# Patient Record
Sex: Female | Born: 1945 | Race: Black or African American | Hispanic: No | Marital: Single | Smoking: Never smoker
Health system: Southern US, Community
[De-identification: ages and names within clinical notes are randomized; demographics above are authoritative.]

## PROBLEM LIST (undated history)

## (undated) ENCOUNTER — Emergency Department (HOSPITAL_COMMUNITY)

## (undated) DIAGNOSIS — I10 Essential (primary) hypertension: Secondary | ICD-10-CM

## (undated) DIAGNOSIS — J45909 Unspecified asthma, uncomplicated: Secondary | ICD-10-CM

## (undated) DIAGNOSIS — J40 Bronchitis, not specified as acute or chronic: Secondary | ICD-10-CM

## (undated) DIAGNOSIS — M109 Gout, unspecified: Secondary | ICD-10-CM

---

## 1998-11-12 ENCOUNTER — Emergency Department (HOSPITAL_COMMUNITY): Admission: EM | Admit: 1998-11-12 | Discharge: 1998-11-12 | Payer: Self-pay | Admitting: Emergency Medicine

## 1999-08-09 ENCOUNTER — Emergency Department (HOSPITAL_COMMUNITY): Admission: EM | Admit: 1999-08-09 | Discharge: 1999-08-09 | Payer: Self-pay | Admitting: Emergency Medicine

## 1999-10-28 ENCOUNTER — Emergency Department (HOSPITAL_COMMUNITY): Admission: EM | Admit: 1999-10-28 | Discharge: 1999-10-28 | Payer: Self-pay | Admitting: Emergency Medicine

## 1999-10-28 ENCOUNTER — Encounter: Payer: Self-pay | Admitting: Emergency Medicine

## 2004-07-27 ENCOUNTER — Emergency Department (HOSPITAL_COMMUNITY): Admission: EM | Admit: 2004-07-27 | Discharge: 2004-07-27 | Payer: Self-pay | Admitting: Emergency Medicine

## 2004-07-29 ENCOUNTER — Ambulatory Visit: Payer: Self-pay | Admitting: Nurse Practitioner

## 2004-07-30 ENCOUNTER — Ambulatory Visit: Payer: Self-pay | Admitting: Nurse Practitioner

## 2004-08-05 ENCOUNTER — Ambulatory Visit: Payer: Self-pay | Admitting: *Deleted

## 2004-08-05 ENCOUNTER — Ambulatory Visit: Payer: Self-pay | Admitting: Nurse Practitioner

## 2004-08-19 ENCOUNTER — Ambulatory Visit: Payer: Self-pay | Admitting: Nurse Practitioner

## 2005-05-09 ENCOUNTER — Ambulatory Visit: Payer: Self-pay | Admitting: Nurse Practitioner

## 2005-12-19 ENCOUNTER — Ambulatory Visit: Payer: Self-pay | Admitting: Nurse Practitioner

## 2006-03-22 ENCOUNTER — Ambulatory Visit: Payer: Self-pay | Admitting: Nurse Practitioner

## 2006-04-03 ENCOUNTER — Ambulatory Visit: Payer: Self-pay | Admitting: Nurse Practitioner

## 2007-03-15 ENCOUNTER — Ambulatory Visit: Payer: Self-pay | Admitting: Internal Medicine

## 2007-03-15 ENCOUNTER — Encounter (INDEPENDENT_AMBULATORY_CARE_PROVIDER_SITE_OTHER): Payer: Self-pay | Admitting: Nurse Practitioner

## 2007-03-15 LAB — CONVERTED CEMR LAB
ALT: 15 units/L (ref 0–35)
AST: 17 units/L (ref 0–37)
Albumin: 3.9 g/dL (ref 3.5–5.2)
Alkaline Phosphatase: 82 units/L (ref 39–117)
BUN: 8 mg/dL (ref 6–23)
Basophils Absolute: 0 10*3/uL (ref 0.0–0.1)
Basophils Relative: 0 % (ref 0–1)
CO2: 20 meq/L (ref 19–32)
Calcium: 9.7 mg/dL (ref 8.4–10.5)
Chloride: 111 meq/L (ref 96–112)
Cholesterol: 178 mg/dL (ref 0–200)
Creatinine, Ser: 0.86 mg/dL (ref 0.40–1.20)
Eosinophils Absolute: 0.3 10*3/uL (ref 0.0–0.7)
Eosinophils Relative: 4 % (ref 0–5)
Glucose, Bld: 119 mg/dL — ABNORMAL HIGH (ref 70–99)
HCT: 40 % (ref 36.0–46.0)
HDL: 42 mg/dL (ref >39)
Hemoglobin: 13 g/dL (ref 12.0–15.0)
LDL Cholesterol: 82 mg/dL (ref 0–99)
Lymphocytes Relative: 48 % — ABNORMAL HIGH (ref 12–46)
Lymphs Abs: 3.3 10*3/uL (ref 0.7–4.0)
MCHC: 32.5 g/dL (ref 30.0–36.0)
MCV: 92.6 fL (ref 78.0–100.0)
Microalb, Ur: 0.88 mg/dL (ref 0.00–1.89)
Monocytes Absolute: 0.6 10*3/uL (ref 0.1–1.0)
Monocytes Relative: 9 % (ref 3–12)
Neutro Abs: 2.7 10*3/uL (ref 1.7–7.7)
Neutrophils Relative %: 39 % — ABNORMAL LOW (ref 43–77)
Platelets: 312 10*3/uL (ref 150–400)
Potassium: 3.9 meq/L (ref 3.5–5.3)
RBC: 4.32 M/uL (ref 3.87–5.11)
RDW: 14.1 % (ref 11.5–15.5)
Sodium: 143 meq/L (ref 135–145)
TSH: 1.716 microintl units/mL (ref 0.350–5.50)
Total Bilirubin: 0.3 mg/dL (ref 0.3–1.2)
Total CHOL/HDL Ratio: 4.2
Total Protein: 7.4 g/dL (ref 6.0–8.3)
Triglycerides: 269 mg/dL — ABNORMAL HIGH (ref <150)
VLDL: 54 mg/dL — ABNORMAL HIGH (ref 0–40)
WBC: 6.8 10*3/uL (ref 4.0–10.5)

## 2007-04-17 ENCOUNTER — Ambulatory Visit: Payer: Self-pay | Admitting: Family Medicine

## 2008-07-03 ENCOUNTER — Encounter (INDEPENDENT_AMBULATORY_CARE_PROVIDER_SITE_OTHER): Payer: Self-pay | Admitting: Internal Medicine

## 2008-07-03 ENCOUNTER — Ambulatory Visit: Payer: Self-pay | Admitting: Internal Medicine

## 2008-07-03 LAB — CONVERTED CEMR LAB
ALT: 12 units/L (ref 0–35)
AST: 14 units/L (ref 0–37)
Albumin: 4 g/dL (ref 3.5–5.2)
Alkaline Phosphatase: 71 units/L (ref 39–117)
BUN: 14 mg/dL (ref 6–23)
CO2: 21 meq/L (ref 19–32)
Calcium: 10.5 mg/dL (ref 8.4–10.5)
Chloride: 110 meq/L (ref 96–112)
Cholesterol: 185 mg/dL (ref 0–200)
Creatinine, Ser: 0.94 mg/dL (ref 0.40–1.20)
Glucose, Bld: 108 mg/dL — ABNORMAL HIGH (ref 70–99)
HDL: 45 mg/dL (ref >39)
LDL Cholesterol: 114 mg/dL — ABNORMAL HIGH (ref 0–99)
Potassium: 4 meq/L (ref 3.5–5.3)
Sodium: 141 meq/L (ref 135–145)
TSH: 0.49 microintl units/mL (ref 0.350–4.500)
Total Bilirubin: 0.4 mg/dL (ref 0.3–1.2)
Total CHOL/HDL Ratio: 4.1
Total Protein: 7.4 g/dL (ref 6.0–8.3)
Triglycerides: 130 mg/dL (ref <150)
VLDL: 26 mg/dL (ref 0–40)

## 2008-07-25 ENCOUNTER — Ambulatory Visit (HOSPITAL_COMMUNITY): Admission: RE | Admit: 2008-07-25 | Discharge: 2008-07-25 | Payer: Self-pay | Admitting: Family Medicine

## 2008-07-31 ENCOUNTER — Ambulatory Visit: Payer: Self-pay | Admitting: Internal Medicine

## 2008-11-06 ENCOUNTER — Encounter (INDEPENDENT_AMBULATORY_CARE_PROVIDER_SITE_OTHER): Payer: Self-pay | Admitting: Internal Medicine

## 2008-11-06 ENCOUNTER — Ambulatory Visit: Payer: Self-pay | Admitting: Internal Medicine

## 2008-11-07 ENCOUNTER — Encounter (INDEPENDENT_AMBULATORY_CARE_PROVIDER_SITE_OTHER): Payer: Self-pay | Admitting: Internal Medicine

## 2008-11-25 ENCOUNTER — Ambulatory Visit (HOSPITAL_COMMUNITY): Admission: RE | Admit: 2008-11-25 | Discharge: 2008-11-25 | Payer: Self-pay | Admitting: Internal Medicine

## 2008-11-26 ENCOUNTER — Ambulatory Visit: Payer: Self-pay | Admitting: Internal Medicine

## 2008-12-30 ENCOUNTER — Ambulatory Visit: Payer: Self-pay | Admitting: Internal Medicine

## 2009-02-04 ENCOUNTER — Ambulatory Visit: Payer: Self-pay | Admitting: Internal Medicine

## 2009-04-07 ENCOUNTER — Ambulatory Visit: Payer: Self-pay | Admitting: Family Medicine

## 2013-03-02 ENCOUNTER — Emergency Department (HOSPITAL_COMMUNITY)
Admission: EM | Admit: 2013-03-02 | Discharge: 2013-03-02 | Disposition: A | Discharge status: Home / Self Care | Payer: Medicare Other | Attending: Emergency Medicine | Admitting: Emergency Medicine

## 2013-03-02 ENCOUNTER — Encounter (HOSPITAL_COMMUNITY): Payer: Self-pay | Admitting: Emergency Medicine

## 2013-03-02 DIAGNOSIS — Z8639 Personal history of other endocrine, nutritional and metabolic disease: Secondary | ICD-10-CM | POA: Diagnosis not present

## 2013-03-02 DIAGNOSIS — R21 Rash and other nonspecific skin eruption: Secondary | ICD-10-CM | POA: Insufficient documentation

## 2013-03-02 DIAGNOSIS — J45901 Unspecified asthma with (acute) exacerbation: Secondary | ICD-10-CM | POA: Insufficient documentation

## 2013-03-02 DIAGNOSIS — R51 Headache: Secondary | ICD-10-CM | POA: Insufficient documentation

## 2013-03-02 DIAGNOSIS — J45909 Unspecified asthma, uncomplicated: Secondary | ICD-10-CM

## 2013-03-02 DIAGNOSIS — Z79899 Other long term (current) drug therapy: Secondary | ICD-10-CM | POA: Insufficient documentation

## 2013-03-02 DIAGNOSIS — IMO0002 Reserved for concepts with insufficient information to code with codable children: Secondary | ICD-10-CM | POA: Insufficient documentation

## 2013-03-02 DIAGNOSIS — Z862 Personal history of diseases of the blood and blood-forming organs and certain disorders involving the immune mechanism: Secondary | ICD-10-CM | POA: Diagnosis not present

## 2013-03-02 DIAGNOSIS — J029 Acute pharyngitis, unspecified: Secondary | ICD-10-CM | POA: Diagnosis not present

## 2013-03-02 DIAGNOSIS — R519 Headache, unspecified: Secondary | ICD-10-CM

## 2013-03-02 HISTORY — DX: Unspecified asthma, uncomplicated: J45.909

## 2013-03-02 HISTORY — DX: Bronchitis, not specified as acute or chronic: J40

## 2013-03-02 HISTORY — DX: Gout, unspecified: M10.9

## 2013-03-02 MED ORDER — TRAMADOL HCL 50 MG PO TABS: 50.0000 mg | ORAL_TABLET | Freq: Four times a day (QID) | ORAL | Status: DC | PRN

## 2013-03-02 MED ORDER — PENICILLIN V POTASSIUM 500 MG PO TABS: 500.0000 mg | ORAL_TABLET | Freq: Four times a day (QID) | ORAL | Status: DC
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
## 2013-03-02 NOTE — ED Notes (Signed)
Pt a+ox4, presents with c/o R sided frontal HA x4 days, also c/o asthma exacerbation x4 days and a rash to R FA x4 days.  Skin PWD.  Speaking full/clear sentences.  MAEI.  Neuros grossly intact.
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
## 2013-06-27 ENCOUNTER — Encounter (HOSPITAL_COMMUNITY): Payer: Self-pay | Admitting: Emergency Medicine

## 2013-06-27 ENCOUNTER — Emergency Department (HOSPITAL_COMMUNITY)
Admission: EM | Admit: 2013-06-27 | Discharge: 2013-06-27 | Disposition: A | Discharge status: Home / Self Care | Payer: Medicare Other | Attending: Emergency Medicine | Admitting: Emergency Medicine

## 2013-06-27 DIAGNOSIS — Z792 Long term (current) use of antibiotics: Secondary | ICD-10-CM | POA: Insufficient documentation

## 2013-06-27 DIAGNOSIS — H1130 Conjunctival hemorrhage, unspecified eye: Secondary | ICD-10-CM

## 2013-06-27 DIAGNOSIS — Z8639 Personal history of other endocrine, nutritional and metabolic disease: Secondary | ICD-10-CM | POA: Insufficient documentation

## 2013-06-27 DIAGNOSIS — Z79899 Other long term (current) drug therapy: Secondary | ICD-10-CM | POA: Insufficient documentation

## 2013-06-27 DIAGNOSIS — Z862 Personal history of diseases of the blood and blood-forming organs and certain disorders involving the immune mechanism: Secondary | ICD-10-CM | POA: Insufficient documentation

## 2013-06-27 DIAGNOSIS — IMO0002 Reserved for concepts with insufficient information to code with codable children: Secondary | ICD-10-CM | POA: Insufficient documentation

## 2013-06-27 DIAGNOSIS — J45909 Unspecified asthma, uncomplicated: Secondary | ICD-10-CM | POA: Insufficient documentation

## 2013-06-27 MED ORDER — TETRACAINE HCL 0.5 % OP SOLN
2.0000 [drp] | Freq: Once | OPHTHALMIC | Status: AC
  Administered 2013-06-27: 2 [drp] via OPHTHALMIC
  Filled 2013-06-27: qty 2

## 2013-06-27 NOTE — ED Notes (Signed)
Pt states she woke up yesterday with redness in her right eye yesterday. Pt denies itching or pain.

## 2013-06-27 NOTE — Discharge Instructions (Signed)
Please read and follow all provided instructions.  Your diagnoses today include:  1. Subconjunctival hematoma     Tests performed today include:  Tonometry to check the pressure inside of your eye - was normal  Vital signs. See below for your results today.   Medications prescribed:   None  Take any prescribed medications only as directed.  Home care instructions:  Follow any educational materials contained in this packet.   Follow-up instructions: Please follow-up with your primary care doctor as needed.   If you do not have a primary care doctor -- see below for referral information.   Return instructions:   Please return to the Emergency Department if you experience worsening symptoms.   Please return immediately if you develop severe pain, pus drainage, new change in vision, or fever.  Please return if you have any other emergent concerns.  Additional Information:  Your vital signs today were: BP 143/77   Pulse 67   Temp(Src) 98.6 F (37 C) (Oral)   Resp 18   SpO2 98% If your blood pressure (BP) was elevated above 135/85 this visit, please have this repeated by your doctor within one month. ---------------

## 2013-06-27 NOTE — ED Provider Notes (Signed)
CSN: [id number]     Arrival date & time 06/27/13  1537 History  This chart was scribed for Alecia Lemming PA-C  working with [name], MD by Stacy Gardner, ED scribe. This patient was seen in room WTR6/WTR6 and the patient's care was started at 4:08 PM.  First MD Initiated Contact with Patient 06/27/13 1547     Chief Complaint  Patient presents with  . Eye Problem     (Consider location/radiation/quality/duration/timing/severity/associated sxs/prior Treatment) Patient is a 68 y.o. female presenting with eye problem. The history is provided by the patient and medical records. No language interpreter was used.  Eye Problem Associated symptoms: redness   Associated symptoms: no discharge, no itching, no photophobia and no vomiting    HPI Comments: Gabriella Butler is a 68 y.o. female who presents to the Emergency Department complaining of redness of her right eye, onset yesterday. Pt denies pain, drainage, vision changes, or itching. She did not try anything for the redness. Denies injury or trauma. Denies any prior eye conditions. She denies recent cough and vomiting. Pt does not wear contacts or eye glasses.    Past Medical History  Diagnosis Date  . Asthma   . Bronchitis   . Gout    History reviewed. No pertinent past surgical history. No family history on file. History  Substance Use Topics  . Smoking status: Never Smoker   . Smokeless tobacco: Never Used  . Alcohol Use: No   OB History   Grav Para Term Preterm Abortions TAB SAB Ect Mult Living                 Review of Systems  HENT: Negative for congestion.   Eyes: Positive for redness. Negative for photophobia, pain, discharge, itching and visual disturbance.  Respiratory: Negative for cough.   Gastrointestinal: Negative for vomiting.  Musculoskeletal: Negative for neck pain.    Allergies  Review of patient's allergies indicates no known allergies.  Home Medications   Prior to Admission medications   Medication  Sig Start Date End Date Taking? Authorizing Provider  beclomethasone (QVAR) 40 MCG/ACT inhaler Inhale 1 puff into the lungs 2 (two) times daily.    Historical Provider, MD  hydrochlorothiazide (MICROZIDE) 12.5 MG capsule Take 12.5 mg by mouth daily.    Historical Provider, MD  penicillin v potassium (VEETID) 500 MG tablet Take 1 tablet (500 mg total) by mouth 4 (four) times daily. 03/02/13   [name], MD  traMADol (ULTRAM) 50 MG tablet Take 1 tablet (50 mg total) by mouth every 6 (six) hours as needed. 03/02/13   [name], MD   BP 143/77  Pulse 67  Temp(Src) 98.6 F (37 C) (Oral)  Resp 18  SpO2 98%  Physical Exam  Nursing note and vitals reviewed. Constitutional: She appears well-developed and well-nourished.  HENT:  Head: Normocephalic and atraumatic.  Eyes: Pupils are equal, round, and reactive to light. Right eye exhibits no chemosis, no discharge and no exudate. Right conjunctiva is not injected. Right conjunctiva has a hemorrhage (laterally, superiorly). Left conjunctiva is not injected. Left conjunctiva has no hemorrhage.  Slit lamp exam:      The right eye shows no hyphema.  Intraocular pressure of 13 mmHg  Neck: Normal range of motion. Neck supple.  Cardiovascular: Normal rate.   Pulmonary/Chest: Effort normal. No respiratory distress.  Neurological: She is alert.  Skin: Skin is warm and dry. She is not diaphoretic.  Psychiatric: She has a normal mood and affect.  ED Course  Procedures (including critical care time) DIAGNOSTIC STUDIES: Oxygen Saturation is 98% on room air, normal by my interpretation.    COORDINATION OF CARE:  4:13 PM  Discussed course of care with pt which includes eye examination . Pt understands and agrees.   Labs Review Labs Reviewed - No data to display  Imaging Review No results found.   EKG Interpretation None      Vital signs reviewed and are as follows: Filed Vitals:   06/27/13 1549  BP: 143/77  Pulse: 67  Temp: 98.6 F (37  C)  Resp: 18     4:26 PM Two drops of tetracaine/proparacaine instilled into affected eye.   Tonometry performed. Right eye pressure: 13  Patient tolerated procedure well without immediate complication.   Patient counseled on typical progression of subconjunctival hemorrhage. Urged PCP followup only as needed. Patient is to return with worsening vision, eye pain, fever.   MDM   Final diagnoses:  Subconjunctival hematoma   Patient with exam consistent with subconjunctival hematoma. Patient has no other symptoms including change in her vision, eye pain, itching, discharge. Do not suspect conjunctivitis. No head injury. Unclear etiology. No recent forceful coughing or vomiting. No ophthalmologic emergency suspected.  I personally performed the services described in this documentation, which was scribed in my presence. The recorded information has been reviewed and is accurate.    Carlisle Cater, PA-C 06/27/13 [phone]

## 2013-07-01 NOTE — ED Provider Notes (Signed)
Medical screening examination/treatment/procedure(s) were performed by non-physician practitioner and as supervising physician I was immediately available for consultation/collaboration.   EKG Interpretation None        Derrich Gaby, MD 07/01/13 1624 

## 2014-08-02 ENCOUNTER — Encounter (HOSPITAL_COMMUNITY): Payer: Self-pay | Admitting: Nurse Practitioner

## 2014-08-02 ENCOUNTER — Emergency Department (HOSPITAL_COMMUNITY)
Admission: EM | Admit: 2014-08-02 | Discharge: 2014-08-02 | Disposition: A | Discharge status: Home / Self Care | Payer: Medicare Other | Attending: Emergency Medicine | Admitting: Emergency Medicine

## 2014-08-02 DIAGNOSIS — R0981 Nasal congestion: Secondary | ICD-10-CM

## 2014-08-02 DIAGNOSIS — Z7951 Long term (current) use of inhaled steroids: Secondary | ICD-10-CM | POA: Diagnosis not present

## 2014-08-02 DIAGNOSIS — H9203 Otalgia, bilateral: Secondary | ICD-10-CM | POA: Diagnosis present

## 2014-08-02 DIAGNOSIS — Z8739 Personal history of other diseases of the musculoskeletal system and connective tissue: Secondary | ICD-10-CM | POA: Diagnosis not present

## 2014-08-02 DIAGNOSIS — Z79899 Other long term (current) drug therapy: Secondary | ICD-10-CM | POA: Diagnosis not present

## 2014-08-02 DIAGNOSIS — J45909 Unspecified asthma, uncomplicated: Secondary | ICD-10-CM | POA: Insufficient documentation

## 2014-08-02 MED ORDER — PSEUDOEPHEDRINE HCL ER 120 MG PO TB12
120.0000 mg | ORAL_TABLET | Freq: Two times a day (BID) | ORAL | Status: DC
  Administered 2014-08-02: 120 mg via ORAL
  Filled 2014-08-02 (×3): qty 1

## 2014-08-02 MED ORDER — IBUPROFEN 200 MG PO TABS
600.0000 mg | ORAL_TABLET | Freq: Once | ORAL | Status: AC
  Administered 2014-08-02: 600 mg via ORAL
  Filled 2014-08-02: qty 3

## 2014-08-02 MED ORDER — PSEUDOEPHEDRINE HCL ER 120 MG PO TB12: 120.0000 mg | ORAL_TABLET | Freq: Two times a day (BID) | ORAL | Status: DC

## 2014-08-02 NOTE — ED Provider Notes (Signed)
CSN: [id number]     Arrival date & time 08/02/14  0111 History   First MD Initiated Contact with Patient 08/02/14 0127     Chief Complaint  Patient presents with  . Otalgia     (Consider location/radiation/quality/duration/timing/severity/associated sxs/prior Treatment) HPI Comments: Patient states she's had a stuffy nose and bilateral ear pain for 3 days.  She was to come yesterday and be seen, but she forgot and fell asleep, but tonight the pain worsened.  She has not taken anything for her symptoms.  Denies any fever or headache.  No chest pain or shortness of breath  Patient is a 69 y.o. female presenting with ear pain.  Otalgia Location:  Bilateral Behind ear:  No abnormality Quality:  Aching Severity:  Mild Onset quality:  Unable to specify Duration:  3 days Timing:  Constant Progression:  Unchanged Chronicity:  New Relieved by:  None tried Worsened by:  Nothing tried Ineffective treatments:  None tried Associated symptoms: congestion and rhinorrhea   Associated symptoms: no ear discharge, no fever, no headaches and no sore throat   Rhinorrhea:    Quality:  Clear   Timing:  Intermittent   Progression:  Unchanged   Past Medical History  Diagnosis Date  . Asthma   . Bronchitis   . Gout    History reviewed. No pertinent past surgical history. History reviewed. No pertinent family history. History  Substance Use Topics  . Smoking status: Never Smoker   . Smokeless tobacco: Never Used  . Alcohol Use: No   OB History    No data available     Review of Systems  Constitutional: Negative for fever.  HENT: Positive for congestion, ear pain and rhinorrhea. Negative for ear discharge and sore throat.   Neurological: Negative for headaches.  All other systems reviewed and are negative.     Allergies  Review of patient's allergies indicates no known allergies.  Home Medications   Prior to Admission medications   Medication Sig Start Date End Date Taking?  Authorizing Provider  beclomethasone (QVAR) 40 MCG/ACT inhaler Inhale 1 puff into the lungs 2 (two) times daily.   Yes Historical Provider, MD  QUEtiapine (SEROQUEL XR) 200 MG 24 hr tablet Take 200 mg by mouth once.   Yes Historical Provider, MD  hydrochlorothiazide (MICROZIDE) 12.5 MG capsule Take 12.5 mg by mouth daily.    Historical Provider, MD  penicillin v potassium (VEETID) 500 MG tablet Take 1 tablet (500 mg total) by mouth 4 (four) times daily. Patient not taking: Reported on 08/02/2014 03/02/13   [name], MD  pseudoephedrine (SUDAFED 12 HOUR) 120 MG 12 hr tablet Take 1 tablet (120 mg total) by mouth 2 (two) times daily. 08/02/14   [name], NP  traMADol (ULTRAM) 50 MG tablet Take 1 tablet (50 mg total) by mouth every 6 (six) hours as needed. Patient not taking: Reported on 08/02/2014 03/02/13   [name], MD   BP 182/71 mmHg  Pulse 81  Temp(Src) 98 F (36.7 C) (Oral)  Resp 14  SpO2 100% Physical Exam  Constitutional: She appears well-developed and well-nourished.  HENT:  Head: Normocephalic.  Right Ear: External ear normal.  Left Ear: External ear normal.  Mouth/Throat: Oropharynx is clear and moist.  Eyes: Pupils are equal, round, and reactive to light.  Neck: Normal range of motion.  Cardiovascular: Normal rate.   Pulmonary/Chest: Effort normal. No respiratory distress.  Musculoskeletal: Normal range of motion.  Neurological: She is alert.  Skin: Skin is warm.  Nursing note and vitals reviewed.   ED Course  Procedures (including critical care time) Labs Review Labs Reviewed - No data to display  Imaging Review No results found.   EKG Interpretation None      MDM   Final diagnoses:  Otalgia of both ears  Nasal congestion         [name], NP 08/02/14 0202  Julianne Rice, MD 08/02/14 [phone]

## 2014-08-02 NOTE — ED Notes (Signed)
Pt is c/o bilateral ear pain, onset 3 days ago, states worsened today rating pain 8/10.

## 2015-09-01 ENCOUNTER — Encounter (HOSPITAL_COMMUNITY): Payer: Self-pay | Admitting: Emergency Medicine

## 2015-09-01 ENCOUNTER — Emergency Department (HOSPITAL_COMMUNITY)
Admission: EM | Admit: 2015-09-01 | Discharge: 2015-09-02 | Disposition: A | Discharge status: Home / Self Care | Payer: Medicare Other | Attending: Emergency Medicine | Admitting: Emergency Medicine

## 2015-09-01 ENCOUNTER — Emergency Department (HOSPITAL_COMMUNITY): Payer: Medicare Other

## 2015-09-01 DIAGNOSIS — Z7951 Long term (current) use of inhaled steroids: Secondary | ICD-10-CM | POA: Insufficient documentation

## 2015-09-01 DIAGNOSIS — J40 Bronchitis, not specified as acute or chronic: Secondary | ICD-10-CM | POA: Insufficient documentation

## 2015-09-01 DIAGNOSIS — J441 Chronic obstructive pulmonary disease with (acute) exacerbation: Secondary | ICD-10-CM | POA: Diagnosis not present

## 2015-09-01 DIAGNOSIS — J449 Chronic obstructive pulmonary disease, unspecified: Secondary | ICD-10-CM | POA: Diagnosis not present

## 2015-09-01 DIAGNOSIS — R0602 Shortness of breath: Secondary | ICD-10-CM | POA: Diagnosis not present

## 2015-09-01 MED ORDER — ALBUTEROL SULFATE (2.5 MG/3ML) 0.083% IN NEBU
5.0000 mg | INHALATION_SOLUTION | Freq: Once | RESPIRATORY_TRACT | Status: AC
  Administered 2015-09-01: 5 mg via RESPIRATORY_TRACT
  Filled 2015-09-01: qty 6

## 2015-09-01 NOTE — ED Triage Notes (Signed)
Patient presents for SOB, HA and right ear pain starting PTA. Also reports productive cough with yellow sputum. Denies fever, N/V, weakness. A&O x4.

## 2015-09-02 ENCOUNTER — Encounter (HOSPITAL_COMMUNITY): Payer: Self-pay | Admitting: Emergency Medicine

## 2015-09-02 DIAGNOSIS — J441 Chronic obstructive pulmonary disease with (acute) exacerbation: Secondary | ICD-10-CM | POA: Diagnosis not present

## 2015-09-02 LAB — CBC WITH DIFFERENTIAL/PLATELET
BASOS ABS: 0 10*3/uL (ref 0.0–0.1)
BASOS PCT: 0 %
EOS ABS: 0.2 10*3/uL (ref 0.0–0.7)
EOS PCT: 2 %
HCT: 38 % (ref 36.0–46.0)
Hemoglobin: 12.3 g/dL (ref 12.0–15.0)
LYMPHS PCT: 45 %
Lymphs Abs: 3.7 10*3/uL (ref 0.7–4.0)
MCH: 29.4 pg (ref 26.0–34.0)
MCHC: 32.4 g/dL (ref 30.0–36.0)
MCV: 90.7 fL (ref 78.0–100.0)
Monocytes Absolute: 0.7 10*3/uL (ref 0.1–1.0)
Monocytes Relative: 9 %
Neutro Abs: 3.6 10*3/uL (ref 1.7–7.7)
Neutrophils Relative %: 44 %
PLATELETS: 270 10*3/uL (ref 150–400)
RBC: 4.19 MIL/uL (ref 3.87–5.11)
RDW: 14.4 % (ref 11.5–15.5)
WBC: 8.3 10*3/uL (ref 4.0–10.5)

## 2015-09-02 LAB — I-STAT TROPONIN, ED: TROPONIN I, POC: 0 ng/mL (ref 0.00–0.08)

## 2015-09-02 LAB — I-STAT CHEM 8, ED
BUN: 13 mg/dL (ref 6–20)
CHLORIDE: 107 mmol/L (ref 101–111)
Calcium, Ion: 1.28 mmol/L — ABNORMAL HIGH (ref 1.12–1.23)
Creatinine, Ser: 1.1 mg/dL — ABNORMAL HIGH (ref 0.44–1.00)
Glucose, Bld: 118 mg/dL — ABNORMAL HIGH (ref 65–99)
HEMATOCRIT: 39 % (ref 36.0–46.0)
Hemoglobin: 13.3 g/dL (ref 12.0–15.0)
POTASSIUM: 3.7 mmol/L (ref 3.5–5.1)
SODIUM: 145 mmol/L (ref 135–145)
TCO2: 25 mmol/L (ref 0–100)

## 2015-09-02 LAB — BRAIN NATRIURETIC PEPTIDE: B Natriuretic Peptide: 21.7 pg/mL (ref 0.0–100.0)

## 2015-09-02 MED ORDER — DOXYCYCLINE HYCLATE 100 MG PO CAPS: 100.0000 mg | ORAL_CAPSULE | Freq: Two times a day (BID) | ORAL | 0 refills | Status: DC

## 2015-09-02 MED ORDER — ALBUTEROL SULFATE HFA 108 (90 BASE) MCG/ACT IN AERS: 1.0000 | INHALATION_SPRAY | Freq: Four times a day (QID) | RESPIRATORY_TRACT | 0 refills | Status: DC | PRN

## 2015-09-02 MED ORDER — PREDNISONE 20 MG PO TABS
60.0000 mg | ORAL_TABLET | Freq: Once | ORAL | Status: AC
  Administered 2015-09-02: 60 mg via ORAL
  Filled 2015-09-02: qty 3

## 2015-09-02 MED ORDER — DM-GUAIFENESIN ER 30-600 MG PO TB12: 1.0000 | ORAL_TABLET | Freq: Two times a day (BID) | ORAL | 0 refills | Status: DC

## 2015-09-02 MED ORDER — DOXYCYCLINE HYCLATE 100 MG PO TABS
100.0000 mg | ORAL_TABLET | Freq: Once | ORAL | Status: AC
  Administered 2015-09-02: 100 mg via ORAL
  Filled 2015-09-02: qty 1

## 2015-09-02 MED ORDER — PREDNISONE 20 MG PO TABS: ORAL_TABLET | ORAL | 0 refills | Status: DC

## 2015-09-02 NOTE — ED Notes (Signed)
Food and drink given.

## 2015-09-02 NOTE — ED Provider Notes (Signed)
[facility] DEPT Provider Note   CSN: [id number] Arrival date & time: 09/01/15  2228  First Provider Contact:  None    By signing my name below, I, Gabriella Butler, attest that this documentation has been prepared under the direction and in the presence of Gabriella Fabiano, MD. Electronically Signed: Judithann Butler, ED Scribe. 09/02/15. 1:28 AM.   History   Chief Complaint Chief Complaint  Patient presents with  . Shortness of Breath  . Migraine    HPI Comments: Gabriella Butler is a 70 y.o. female who presents to the Emergency Department complaining of gradually worsening persistent moderate productive cough with yellow sputum onset yesterday. Pt reports associated shortness of breath and wheezing. No alleviating factors noted. Pt has not tried any medications PTA. She denies any fever, chills, acute leg swelling, n/v, or weakness.  No PND no orthopnea no pink or frothy sputum.  Ran out of her inhaler and that is what she thought brought this on  The history is provided by the patient. No language interpreter was used.  Cough   This is a new problem. The current episode started yesterday. The problem occurs constantly. The problem has been gradually worsening. Pain location: np pain. Pertinent negatives include no numbness. She has tried nothing for the symptoms. The treatment provided no relief.    Past Medical History:  Diagnosis Date  . Asthma   . Bronchitis   . Gout     There are no active problems to display for this patient.   History reviewed. No pertinent surgical history.  OB History    No data available       Home Medications    Prior to Admission medications   Medication Sig Start Date End Date Taking? Authorizing Provider  beclomethasone (QVAR) 40 MCG/ACT inhaler Inhale 1 puff into the lungs 2 (two) times daily.   Yes Historical Provider, MD  penicillin v potassium (VEETID) 500 MG tablet Take 1 tablet (500 mg total) by mouth 4 (four) times  daily. Patient not taking: Reported on 08/02/2014 03/02/13   [name], MD  pseudoephedrine (SUDAFED 12 HOUR) 120 MG 12 hr tablet Take 1 tablet (120 mg total) by mouth 2 (two) times daily. Patient not taking: Reported on 09/01/2015 08/02/14   [name], NP  traMADol (ULTRAM) 50 MG tablet Take 1 tablet (50 mg total) by mouth every 6 (six) hours as needed. Patient not taking: Reported on 08/02/2014 03/02/13   [name], MD    Family History No family history on file.  Social History Social History  Substance Use Topics  . Smoking status: Never Smoker  . Smokeless tobacco: Never Used  . Alcohol use No     Allergies   Review of patient's allergies indicates no known allergies.   Review of Systems Review of Systems  Constitutional: Negative for chills and fever.  Respiratory: Positive for cough, shortness of breath and wheezing. Negative for stridor.   Cardiovascular: Negative for chest pain, palpitations and leg swelling.  Gastrointestinal: Negative for nausea and vomiting.  Neurological: Negative for weakness and numbness.  All other systems reviewed and are negative.    Physical Exam Updated Vital Signs BP 155/87 (BP Location: Left Arm)   Pulse 78   Temp 98.4 F (36.9 C) (Oral)   Resp 20   SpO2 98%   Physical Exam  Constitutional: She is oriented to person, place, and time. She appears well-developed and well-nourished. No distress.  HENT:  Head: Normocephalic and atraumatic.  Mouth/Throat: Oropharynx is  clear and moist.  Eyes: Conjunctivae and EOM are normal. Pupils are equal, round, and reactive to light.  Neck: Neck supple. No JVD present. No tracheal deviation present.  No bruits  Cardiovascular: Normal rate, regular rhythm and normal heart sounds.   Pulmonary/Chest: Effort normal. No stridor. No respiratory distress. She has no wheezes. She has no rales. She exhibits no tenderness.  Lungs clear  Abdominal: Soft. There is no rebound and no guarding.  Hyperactive  bowel sounds  Musculoskeletal: Normal range of motion.  All compartments soft No pitting edema, no chords, no tenderness  Neurological: She is alert and oriented to person, place, and time. No cranial nerve deficit.  Skin: Skin is warm and dry. Capillary refill takes less than 2 seconds.  Psychiatric: She has a normal mood and affect. Her behavior is normal.  Nursing note and vitals reviewed.    ED Treatments / Results  DIAGNOSTIC STUDIES: Oxygen Saturation is 98% on RA, normal by my interpretation.    COORDINATION OF CARE: 1:25 AM- Pt advised of plan for treatment and pt agrees. Pt will receive lab work, chest x-ray, and EKG for further evaluation. She reports relief of her wheezing from the albuterol treatment received here in the ED today.    Labs (all labs ordered are listed, but only abnormal results are displayed) Labs Reviewed  CBC WITH DIFFERENTIAL/PLATELET  BRAIN NATRIURETIC PEPTIDE  I-STAT CHEM 8, ED  I-STAT TROPOININ, ED    EKG  EKG Interpretation  Date/Time:  Tuesday September 01 2015 22:41:46 EDT Ventricular Rate:  74 PR Interval:    QRS Duration: 83 QT Interval:  386 QTC Calculation: 429 R Axis:   31 Text Interpretation:  Sinus rhythm Low voltage, precordial leads Abnormal R-wave progression, early transition Baseline wander in lead(s) V2 No old tracing to compare Confirmed by BELFI  MD, MELANIE ([id number]) on 09/01/2015 11:44:22 PM       Radiology Dg Chest 2 View  Result Date: 09/01/2015 CLINICAL DATA:  70 year old female with shortness of breath EXAM: CHEST  2 VIEW COMPARISON:  Chest radiograph dated 07/27/2004 FINDINGS: Two views of the chest demonstrate mild central vascular prominence. Small focal hazy density in the left mid lung field may represent vascular crowding or less likely infiltrate. Clinical correlation is recommended. There is no pleural effusion, or pneumothorax. The cardiac silhouette is within normal limits. No acute osseous pathology. IMPRESSION:  Probable mild congestive changes. Vascular crowding versus less likely small focal infiltrate in the left mid lung field. Electronically Signed   By: [name] M.D.   On: 09/01/2015 22:58    Procedures Procedures (including critical care time)  Medications Ordered in ED Medications  albuterol (PROVENTIL) (2.5 MG/3ML) 0.083% nebulizer solution 5 mg (5 mg Nebulization Given 09/01/15 2244)     Initial Impression / Assessment and Plan / ED Course  Yovanny Coats, MD has reviewed the triage vital signs and the nursing notes.  Pertinent labs & imaging results that were available during my care of the patient were reviewed by me and considered in my medical decision making (see chart for details).  Clinical Course    Results for orders placed or performed during the hospital encounter of 09/01/15  CBC with Differential/Platelet  Result Value Ref Range   WBC 8.3 4.0 - 10.5 K/uL   RBC 4.19 3.87 - 5.11 MIL/uL   Hemoglobin 12.3 12.0 - 15.0 g/dL   HCT 38.0 36.0 - 46.0 %   MCV 90.7 78.0 - 100.0 fL   MCH  29.4 26.0 - 34.0 pg   MCHC 32.4 30.0 - 36.0 g/dL   RDW 14.4 11.5 - 15.5 %   Platelets 270 150 - 400 K/uL   Neutrophils Relative % 44 %   Neutro Abs 3.6 1.7 - 7.7 K/uL   Lymphocytes Relative 45 %   Lymphs Abs 3.7 0.7 - 4.0 K/uL   Monocytes Relative 9 %   Monocytes Absolute 0.7 0.1 - 1.0 K/uL   Eosinophils Relative 2 %   Eosinophils Absolute 0.2 0.0 - 0.7 K/uL   Basophils Relative 0 %   Basophils Absolute 0.0 0.0 - 0.1 K/uL  Brain natriuretic peptide  Result Value Ref Range   B Natriuretic Peptide 21.7 0.0 - 100.0 pg/mL  I-Stat Chem 8, ED  Result Value Ref Range   Sodium 145 135 - 145 mmol/L   Potassium 3.7 3.5 - 5.1 mmol/L   Chloride 107 101 - 111 mmol/L   BUN 13 6 - 20 mg/dL   Creatinine, Ser 1.10 (H) 0.44 - 1.00 mg/dL   Glucose, Bld 118 (H) 65 - 99 mg/dL   Calcium, Ion 1.28 (H) 1.12 - 1.23 mmol/L   TCO2 25 0 - 100 mmol/L   Hemoglobin 13.3 12.0 - 15.0 g/dL   HCT 39.0  36.0 - 46.0 %  I-stat troponin, ED  Result Value Ref Range   Troponin i, poc 0.00 0.00 - 0.08 ng/mL   Comment 3           Dg Chest 2 View  Result Date: 09/01/2015 CLINICAL DATA:  70 year old female with shortness of breath EXAM: CHEST  2 VIEW COMPARISON:  Chest radiograph dated 07/27/2004 FINDINGS: Two views of the chest demonstrate mild central vascular prominence. Small focal hazy density in the left mid lung field may represent vascular crowding or less likely infiltrate. Clinical correlation is recommended. There is no pleural effusion, or pneumothorax. The cardiac silhouette is within normal limits. No acute osseous pathology. IMPRESSION: Probable mild congestive changes. Vascular crowding versus less likely small focal infiltrate in the left mid lung field. Electronically Signed   By: [name] M.D.   On: 09/01/2015 22:58   Medications  albuterol (PROVENTIL) (2.5 MG/3ML) 0.083% nebulizer solution 5 mg (5 mg Nebulization Given 09/01/15 2244)  predniSONE (DELTASONE) tablet 60 mg (60 mg Oral Given 09/02/15 0145)  doxycycline (VIBRA-TABS) tablet 100 mg (100 mg Oral Given 09/02/15 0146)     Final Clinical Impressions(s) / ED Diagnoses   Final diagnoses:  None   I personally performed the services described in this documentation, which was scribed in my presence. The recorded information has been reviewed and is accurate.   The symptoms are consistent with an infection and bronchitis and not with CHF.  Lungs are clear will start steroids inhaler and antibiotics and have patient follow up with PMD for recheck in 2 days.  All questions answered to patient's satisfaction. Based on history and exam patient has been appropriately medically screened and emergency conditions excluded. Patient is stable for discharge at this time. Follow up with your PMDfor recheck in 2 daysand strict return precautions given.  New Prescriptions New Prescriptions   No medications on file     Najmo Pardue,  MD 09/02/15 [phone]

## 2015-09-02 NOTE — ED Notes (Signed)
No respiratory or acute distress noted alert and oriented x 3 call light in reach no reaction to medication noted able to speak in full sentences. 

## 2015-09-02 NOTE — ED Notes (Signed)
No respiratory or acute distress noted alert and oriented x 3 able to speak in full sentences states feels better now call light in reach.

## 2016-08-08 ENCOUNTER — Emergency Department (HOSPITAL_COMMUNITY): Payer: Medicare Other

## 2016-08-08 ENCOUNTER — Emergency Department (HOSPITAL_COMMUNITY)
Admission: EM | Admit: 2016-08-08 | Discharge: 2016-08-08 | Disposition: A | Discharge status: Home / Self Care | Payer: Medicare Other | Attending: Emergency Medicine | Admitting: Emergency Medicine

## 2016-08-08 ENCOUNTER — Encounter (HOSPITAL_COMMUNITY): Payer: Self-pay | Admitting: Emergency Medicine

## 2016-08-08 DIAGNOSIS — Z79899 Other long term (current) drug therapy: Secondary | ICD-10-CM | POA: Insufficient documentation

## 2016-08-08 DIAGNOSIS — M791 Myalgia: Secondary | ICD-10-CM | POA: Diagnosis not present

## 2016-08-08 DIAGNOSIS — J45909 Unspecified asthma, uncomplicated: Secondary | ICD-10-CM | POA: Insufficient documentation

## 2016-08-08 DIAGNOSIS — J069 Acute upper respiratory infection, unspecified: Secondary | ICD-10-CM | POA: Insufficient documentation

## 2016-08-08 DIAGNOSIS — R05 Cough: Secondary | ICD-10-CM | POA: Diagnosis present

## 2016-08-08 DIAGNOSIS — Z7951 Long term (current) use of inhaled steroids: Secondary | ICD-10-CM | POA: Diagnosis not present

## 2016-08-08 MED ORDER — AZITHROMYCIN 250 MG PO TABS: 250.0000 mg | ORAL_TABLET | Freq: Every day | ORAL | 0 refills | Status: DC

## 2016-08-08 NOTE — ED Provider Notes (Signed)
[facility] DEPT Provider Note   CSN: [id number] Arrival date & time: 08/08/16  0413     History   Chief Complaint Chief Complaint  Patient presents with  . Cough  . Generalized Body Aches  . Chills    HPI Gabriella Butler is a 71 y.o. female presented with 1 day history of stuffy nose, cough, body aches.  Patient states that around 10:30 last night, she started to feel poorly. This continued throughout the night, but states that she feels slightly better this morning. She's not tried anything for her symptoms including decongestants, Tylenol, or ibuprofen. She denies fever, chills, nausea, vomiting, abnormal bowel movements, or urinary symptoms. She states that she was recently exposed to several people with bronchitis. She reports no other medical problems, states she does not take medicines on a daily basis.  HPI  Past Medical History:  Diagnosis Date  . Asthma   . Bronchitis   . Gout     There are no active problems to display for this patient.   History reviewed. No pertinent surgical history.  OB History    No data available       Home Medications    Prior to Admission medications   Medication Sig Start Date End Date Taking? Authorizing Provider  albuterol (PROVENTIL HFA;VENTOLIN HFA) 108 (90 Base) MCG/ACT inhaler Inhale 1-2 puffs into the lungs every 6 (six) hours as needed for wheezing or shortness of breath. 09/02/15   Palumbo, April, MD  azithromycin (ZITHROMAX) 250 MG tablet Take 1 tablet (250 mg total) by mouth daily. Take first 2 tablets together, then 1 every day until finished. 08/08/16   Faustina Gebert, PA-C  beclomethasone (QVAR) 40 MCG/ACT inhaler Inhale 1 puff into the lungs 2 (two) times daily.    [provider]  dextromethorphan-guaiFENesin (MUCINEX DM) 30-600 MG 12hr tablet Take 1 tablet by mouth 2 (two) times daily. 09/02/15   Palumbo, April, MD  doxycycline (VIBRAMYCIN) 100 MG capsule Take 1 capsule (100 mg total) by mouth 2 (two) times  daily. One po bid x 7 days 09/02/15   Palumbo, April, MD  penicillin v potassium (VEETID) 500 MG tablet Take 1 tablet (500 mg total) by mouth 4 (four) times daily. Patient not taking: Reported on 08/02/2014 03/02/13   [name], MD  predniSONE (DELTASONE) 20 MG tablet 3 tabs po day one, then 2 po daily x 4 days 09/02/15   Randal Buba, April, MD  pseudoephedrine (SUDAFED 12 HOUR) 120 MG 12 hr tablet Take 1 tablet (120 mg total) by mouth 2 (two) times daily. Patient not taking: Reported on 09/01/2015 08/02/14   [name], NP  traMADol (ULTRAM) 50 MG tablet Take 1 tablet (50 mg total) by mouth every 6 (six) hours as needed. Patient not taking: Reported on 08/02/2014 03/02/13   [name], MD    Family History History reviewed. No pertinent family history.  Social History Social History  Substance Use Topics  . Smoking status: Never Smoker  . Smokeless tobacco: Never Used  . Alcohol use No     Allergies   Patient has no known allergies.   Review of Systems Review of Systems  Constitutional: Negative for appetite change, chills and fever.  HENT: Positive for congestion, rhinorrhea and sore throat. Negative for sinus pain and sinus pressure.   Eyes: Negative for pain, discharge and itching.  Respiratory: Positive for cough. Negative for chest tightness and shortness of breath.   Cardiovascular: Negative for chest pain.  Gastrointestinal: Negative for abdominal distention,  abdominal pain, constipation, diarrhea, nausea and vomiting.  Genitourinary: Negative for dysuria, frequency and hematuria.  Musculoskeletal: Positive for myalgias. Negative for arthralgias and back pain.  Skin: Negative for rash.  Neurological: Negative for dizziness and headaches.  Psychiatric/Behavioral: Negative for confusion.     Physical Exam Updated Vital Signs BP (!) 148/75 (BP Location: Left Arm)   Pulse (!) 56   Temp 98 F (36.7 C) (Oral)   Resp 16   Ht 5\' 7"  (1.702 m)   Wt 111.1 kg (245 lb)   SpO2 100%    BMI 38.37 kg/m   Physical Exam  Constitutional: She is oriented to person, place, and time. She appears well-developed and well-nourished.  HENT:  Head: Normocephalic and atraumatic.  Right Ear: Tympanic membrane, external ear and ear canal normal.  Left Ear: Tympanic membrane, external ear and ear canal normal.  Nose: Mucosal edema and rhinorrhea present. Right sinus exhibits no maxillary sinus tenderness and no frontal sinus tenderness. Left sinus exhibits no maxillary sinus tenderness and no frontal sinus tenderness.  Mouth/Throat: Uvula is midline, oropharynx is clear and moist and mucous membranes are normal.  Eyes: Conjunctivae and EOM are normal. Pupils are equal, round, and reactive to light.  Neck: Normal range of motion. Neck supple.  Cardiovascular: Normal rate, regular rhythm, normal heart sounds and intact distal pulses.   Pulmonary/Chest: Effort normal and breath sounds normal. No respiratory distress. She has no wheezes.  Abdominal: Soft. Bowel sounds are normal. She exhibits no distension. There is no tenderness.  Musculoskeletal: Normal range of motion.  Lymphadenopathy:    She has no cervical adenopathy.  Neurological: She is alert and oriented to person, place, and time.  Skin: Skin is warm and dry. No rash noted.  Psychiatric: She has a normal mood and affect.  Nursing note and vitals reviewed.    ED Treatments / Results  Labs (all labs ordered are listed, but only abnormal results are displayed) Labs Reviewed - No data to display  EKG  EKG Interpretation None       Radiology Dg Chest 2 View  Result Date: 08/08/2016 CLINICAL DATA:  Body aches EXAM: CHEST  2 VIEW COMPARISON:  09/01/2015 FINDINGS: Ill-defined opacities project lateral to the left heart border. Right lung is clear. No pneumothorax or pleural effusion. Normal heart size. IMPRESSION: Left basilar atelectasis versus airspace disease. Followup PA and lateral chest X-ray is recommended in 3-4 weeks  following trial of antibiotic therapy to ensure resolution and exclude underlying malignancy. Electronically Signed   By: Marybelle Killings M.D.   On: 08/08/2016 07:38    Procedures Procedures (including critical care time)  Medications Ordered in ED Medications - No data to display   Initial Impression / Assessment and Plan / ED Course  I have reviewed the triage vital signs and the nursing notes.  Pertinent labs & imaging results that were available during my care of the patient were reviewed by me and considered in my medical decision making (see chart for details).     Patient presenting with cold-like symptoms beginning last night. Will order chest x-ray to rule out infection.  Chest x-ray showed left basilar atelectasis versus airspace disease. Recommended follow-up x-ray in 3-4 weeks as well as antibiotic therapy. Discussed with attending, and Dr. Zenia Resides evaluated the patient. Will prescribe Z-Pak to patient. She is to continue symptomatic treatment, including Tylenol or ibuprofen as needed for fevers or body aches. Discussed importance of follow-up in 3-4 weeks for repeat chest x-ray. Patient does  not have primary care provider, will refer to [place] and wellness. Patient appears safe for discharge. Return precautions given. Patient states she understands and agrees to plan.  Final Clinical Impressions(s) / ED Diagnoses   Final diagnoses:  Upper respiratory tract infection, unspecified type    New Prescriptions Discharge Medication List as of 08/08/2016  8:27 AM    START taking these medications   Details  azithromycin (ZITHROMAX) 250 MG tablet Take 1 tablet (250 mg total) by mouth daily. Take first 2 tablets together, then 1 every day until finished., Starting Mon 08/08/2016, Print         [place], [place], PA-C 08/08/16 1550    Lacretia Leigh, MD 08/12/16 [phone]

## 2016-08-08 NOTE — ED Notes (Signed)
Patient is alert and oriented x3.  She was given DC instructions and follow up visit instructions.  Patient gave verbal understanding. She was DC ambulatory under her own power to home.  V/S stable.  He was not showing any signs of distress on DC 

## 2016-08-08 NOTE — Discharge Instructions (Signed)
Take all the antibiotics as prescribed. You may use Tylenol or ibuprofen as needed for fever or body aches. It is very important that you follow-up in 3-4 weeks for repeat chest x-ray. Return to the emergency department if you have worsening fever or chills despite antibiotics, or any new or worsening symptoms.

## 2016-08-08 NOTE — ED Triage Notes (Signed)
Patient states around 22:30 she started feeling real cold, stuffy nose, cough, and body aches. Patient has no n/v/d. Patient states she has no other symptoms.

## 2016-08-08 NOTE — ED Notes (Signed)
Pt c/o cough, runny nose, chills onset 22:30.

## 2016-08-08 NOTE — ED Provider Notes (Signed)
Medical screening examination/treatment/procedure(s) were conducted as a shared visit with non-physician practitioner(s) and myself.  I personally evaluated the patient during the encounter.   EKG Interpretation None     71 year old lady presents with URI symptoms 1 day. X-ray with possible pneumonia. Will treat with antibiotics and follow-up instructions given   Lacretia Leigh, MD 08/08/16 [phone]

## 2016-09-14 ENCOUNTER — Emergency Department (HOSPITAL_COMMUNITY): Payer: Medicare Other

## 2016-09-14 ENCOUNTER — Encounter (HOSPITAL_COMMUNITY): Payer: Self-pay | Admitting: Emergency Medicine

## 2016-09-14 ENCOUNTER — Emergency Department (HOSPITAL_COMMUNITY)
Admission: EM | Admit: 2016-09-14 | Discharge: 2016-09-14 | Disposition: A | Discharge status: Home / Self Care | Payer: Medicare Other | Attending: Emergency Medicine | Admitting: Emergency Medicine

## 2016-09-14 DIAGNOSIS — R918 Other nonspecific abnormal finding of lung field: Secondary | ICD-10-CM | POA: Diagnosis not present

## 2016-09-14 DIAGNOSIS — Z0001 Encounter for general adult medical examination with abnormal findings: Secondary | ICD-10-CM | POA: Diagnosis not present

## 2016-09-14 DIAGNOSIS — Z79899 Other long term (current) drug therapy: Secondary | ICD-10-CM | POA: Insufficient documentation

## 2016-09-14 DIAGNOSIS — Z8709 Personal history of other diseases of the respiratory system: Secondary | ICD-10-CM | POA: Diagnosis not present

## 2016-09-14 DIAGNOSIS — J45909 Unspecified asthma, uncomplicated: Secondary | ICD-10-CM | POA: Diagnosis not present

## 2016-09-14 DIAGNOSIS — Z9289 Personal history of other medical treatment: Secondary | ICD-10-CM | POA: Diagnosis not present

## 2016-09-14 DIAGNOSIS — Z0000 Encounter for general adult medical examination without abnormal findings: Secondary | ICD-10-CM | POA: Insufficient documentation

## 2016-09-14 NOTE — ED Provider Notes (Signed)
[facility] DEPT Provider Note   CSN: [id number] Arrival date & time: 09/14/16  1450     History   Chief Complaint No chief complaint on file.   HPI Gabriella Butler is a 71 y.o. female with history of asthma, gout, and HTN who presents today for repeat chest x-ray. She was seen and evaluated 08/08/2016 for cough, congestion, and body aches and was found to have. She was treated with azithromycin and per radiology recommendation she is back for repeat chest x-ray. She denies shortness of breath, chest pains, fevers, chills, congestion, or any other medical complaints. She was also encouraged to make an appointment at [facility] and wellness because she does not have a primary care physician. She has not made an appointment at this time. She has been out of her blood pressure medications for 6 months but denies any associated symptoms  The history is provided by the patient.    Past Medical History:  Diagnosis Date  . Asthma   . Bronchitis   . Gout     There are no active problems to display for this patient.   History reviewed. No pertinent surgical history.  OB History    No data available       Home Medications    Prior to Admission medications   Medication Sig Start Date End Date Taking? Authorizing Provider  beclomethasone (QVAR) 40 MCG/ACT inhaler Inhale 1 puff into the lungs 2 (two) times daily.   Yes [provider]  albuterol (PROVENTIL HFA;VENTOLIN HFA) 108 (90 Base) MCG/ACT inhaler Inhale 1-2 puffs into the lungs every 6 (six) hours as needed for wheezing or shortness of breath. Patient not taking: Reported on 09/14/2016 09/02/15   Palumbo, April, MD  azithromycin (ZITHROMAX) 250 MG tablet Take 1 tablet (250 mg total) by mouth daily. Take first 2 tablets together, then 1 every day until finished. Patient not taking: Reported on 09/14/2016 08/08/16   Caccavale, Sophia, PA-C  dextromethorphan-guaiFENesin (MUCINEX DM) 30-600 MG 12hr tablet Take 1 tablet by  mouth 2 (two) times daily. Patient not taking: Reported on 09/14/2016 09/02/15   Randal Buba, April, MD  doxycycline (VIBRAMYCIN) 100 MG capsule Take 1 capsule (100 mg total) by mouth 2 (two) times daily. One po bid x 7 days Patient not taking: Reported on 09/14/2016 09/02/15   Palumbo, April, MD  penicillin v potassium (VEETID) 500 MG tablet Take 1 tablet (500 mg total) by mouth 4 (four) times daily. Patient not taking: Reported on 08/02/2014 03/02/13   [name], MD  predniSONE (DELTASONE) 20 MG tablet 3 tabs po day one, then 2 po daily x 4 days Patient not taking: Reported on 09/14/2016 09/02/15   Randal Buba, April, MD  pseudoephedrine (SUDAFED 12 HOUR) 120 MG 12 hr tablet Take 1 tablet (120 mg total) by mouth 2 (two) times daily. Patient not taking: Reported on 09/01/2015 08/02/14   [name], NP  traMADol (ULTRAM) 50 MG tablet Take 1 tablet (50 mg total) by mouth every 6 (six) hours as needed. Patient not taking: Reported on 08/02/2014 03/02/13   [name], MD    Family History History reviewed. No pertinent family history.  Social History Social History  Substance Use Topics  . Smoking status: Never Smoker  . Smokeless tobacco: Never Used  . Alcohol use No     Allergies   Patient has no known allergies.   Review of Systems Review of Systems  Constitutional: Negative for chills and fever.  HENT: Negative for congestion.   Respiratory:  Negative for cough, chest tightness and shortness of breath.   Cardiovascular: Negative for chest pain.  Gastrointestinal: Negative for abdominal pain, nausea and vomiting.  Neurological: Negative for headaches.  All other systems reviewed and are negative.    Physical Exam Updated Vital Signs BP (!) 143/67   Pulse 67   Temp 98.3 F (36.8 C) (Oral)   Resp 16   SpO2 94%   Physical Exam  Constitutional: She appears well-developed and well-nourished. No distress.  HENT:  Head: Normocephalic and atraumatic.  Eyes: Conjunctivae are normal. Right eye  exhibits no discharge. Left eye exhibits no discharge.  Neck: No JVD present. No tracheal deviation present.  Cardiovascular: Normal rate, regular rhythm, normal heart sounds and intact distal pulses.   Pulmonary/Chest: Effort normal and breath sounds normal. No respiratory distress. She has no wheezes. She has no rales. She exhibits no tenderness.  Abdominal: Soft. Bowel sounds are normal. She exhibits no distension. There is no tenderness.  Musculoskeletal: She exhibits no edema.  Neurological: She is alert.  Skin: Skin is warm and dry. No erythema.  Psychiatric: She has a normal mood and affect. Her behavior is normal.  Nursing note and vitals reviewed.    ED Treatments / Results  Labs (all labs ordered are listed, but only abnormal results are displayed) Labs Reviewed - No data to display  EKG  EKG Interpretation None       Radiology Dg Chest 2 View  Result Date: 09/14/2016 CLINICAL DATA:  Follow-up previous abnormal chest x-ray on August 08, 2016. No current complaints. The patient has completed antibiotic therapy. History of asthma -bronchitis. Nonsmoker. EXAM: CHEST  2 VIEW COMPARISON:  PA and lateral chest x-ray of August 08, 2016. FINDINGS: The lungs are adequately inflated. There is subtle density above the cardiac apex which is stable as far back as 2006. There has been interval clearing of the retrocardiac region on the left. There is no residual infiltrate in the inferior aspect of the right upper lobe. There is no pleural effusion. The heart and pulmonary vascularity are normal. The mediastinum is normal in width. The trachea is midline. The bony thorax exhibits no acute abnormality. IMPRESSION: Interval clearing of infiltrates at the right lung base and inferior aspect of the left upper lobe. Chronic stable density above the cardiac apex on the left. Electronically Signed   By: David  Martinique M.D.   On: 09/14/2016 17:08    Procedures Procedures (including critical care  time)  Medications Ordered in ED Medications - No data to display   Initial Impression / Assessment and Plan / ED Course  I have reviewed the triage vital signs and the nursing notes.  Pertinent labs & imaging results that were available during my care of the patient were reviewed by me and considered in my medical decision making (see chart for details).     Patient with history of abnormal chest x-ray who presents for repeat imaging. No medical complaints at this time and vital signs are stable. Repeat chest x-ray shows clearing of infiltrates at the right lung base and inferior aspect of the left upper lobe as well as a stable density above the cardiac apex on the left which has been stable as far back as 2006. Examination is unremarkable. Emphasized the importance of follow-up with primary care physician for workup of her high blood pressure and asthma. She is not having any symptoms at this time. Discussed indications for return to the ED. Pt verbalized understanding of and agreement  with plan and is safe for discharge home at this time. Patient seen and a value by Dr. Ellender Hose who agrees with plan and assessment.  Final Clinical Impressions(s) / ED Diagnoses   Final diagnoses:  History of chest x-ray    New Prescriptions New Prescriptions   No medications on file     Debroah Baller 09/14/16 1929    Duffy Bruce, MD 09/15/16 1110

## 2016-09-14 NOTE — Discharge Instructions (Signed)
Your repeat chest x-ray looks better than a few weeks ago. Follow-up with [facility] and wellness as soon as possible for reevaluation of your chronic problems like high blood pressure and asthma. Return to the ED if any concerning signs or symptoms develop.

## 2016-09-14 NOTE — ED Triage Notes (Signed)
Pt seen in ED on July 9th, had abnormal x-ray, was told to return for repeat chest x-ray after a few weeks of antibiotics. Asymptomatic currently.

## 2017-02-03 ENCOUNTER — Encounter (HOSPITAL_COMMUNITY): Payer: Self-pay | Admitting: Emergency Medicine

## 2017-02-03 DIAGNOSIS — M79604 Pain in right leg: Secondary | ICD-10-CM | POA: Insufficient documentation

## 2017-02-03 DIAGNOSIS — S8991XA Unspecified injury of right lower leg, initial encounter: Secondary | ICD-10-CM | POA: Diagnosis not present

## 2017-02-03 DIAGNOSIS — Z5321 Procedure and treatment not carried out due to patient leaving prior to being seen by health care provider: Secondary | ICD-10-CM | POA: Insufficient documentation

## 2017-02-03 DIAGNOSIS — Y939 Activity, unspecified: Secondary | ICD-10-CM | POA: Diagnosis not present

## 2017-02-03 DIAGNOSIS — M25561 Pain in right knee: Secondary | ICD-10-CM | POA: Diagnosis not present

## 2017-02-03 DIAGNOSIS — M1711 Unilateral primary osteoarthritis, right knee: Secondary | ICD-10-CM | POA: Diagnosis not present

## 2017-02-03 DIAGNOSIS — W010XXA Fall on same level from slipping, tripping and stumbling without subsequent striking against object, initial encounter: Secondary | ICD-10-CM | POA: Diagnosis not present

## 2017-02-03 DIAGNOSIS — M25461 Effusion, right knee: Secondary | ICD-10-CM | POA: Diagnosis not present

## 2017-02-03 DIAGNOSIS — R0981 Nasal congestion: Secondary | ICD-10-CM | POA: Insufficient documentation

## 2017-02-03 DIAGNOSIS — S99911A Unspecified injury of right ankle, initial encounter: Secondary | ICD-10-CM | POA: Diagnosis present

## 2017-02-03 DIAGNOSIS — Z760 Encounter for issue of repeat prescription: Secondary | ICD-10-CM | POA: Diagnosis not present

## 2017-02-03 DIAGNOSIS — S93401A Sprain of unspecified ligament of right ankle, initial encounter: Secondary | ICD-10-CM | POA: Diagnosis not present

## 2017-02-03 DIAGNOSIS — J45909 Unspecified asthma, uncomplicated: Secondary | ICD-10-CM | POA: Diagnosis not present

## 2017-02-03 DIAGNOSIS — M25571 Pain in right ankle and joints of right foot: Secondary | ICD-10-CM | POA: Diagnosis not present

## 2017-02-03 DIAGNOSIS — Y929 Unspecified place or not applicable: Secondary | ICD-10-CM | POA: Diagnosis not present

## 2017-02-03 DIAGNOSIS — Y998 Other external cause status: Secondary | ICD-10-CM | POA: Diagnosis not present

## 2017-02-03 NOTE — ED Triage Notes (Signed)
Patient here from home with complaints of right leg pain after fall today. Ambulatory.  Also states that she needs a new prescription for her inhaler.

## 2017-02-04 ENCOUNTER — Emergency Department (HOSPITAL_COMMUNITY): Payer: Medicare Other

## 2017-02-04 ENCOUNTER — Emergency Department (HOSPITAL_COMMUNITY)
Admission: EM | Admit: 2017-02-04 | Discharge: 2017-02-04 | Disposition: A | Discharge status: Home / Self Care | Payer: Medicare Other | Attending: Emergency Medicine | Admitting: Emergency Medicine

## 2017-02-04 ENCOUNTER — Emergency Department (HOSPITAL_COMMUNITY)
Admission: EM | Admit: 2017-02-04 | Discharge: 2017-02-04 | Disposition: A | Discharge status: Home / Self Care | Payer: Medicare Other | Source: Home / Self Care

## 2017-02-04 ENCOUNTER — Encounter (HOSPITAL_COMMUNITY): Payer: Self-pay | Admitting: Emergency Medicine

## 2017-02-04 DIAGNOSIS — S8991XA Unspecified injury of right lower leg, initial encounter: Secondary | ICD-10-CM | POA: Diagnosis not present

## 2017-02-04 DIAGNOSIS — S93401A Sprain of unspecified ligament of right ankle, initial encounter: Secondary | ICD-10-CM | POA: Insufficient documentation

## 2017-02-04 DIAGNOSIS — M1711 Unilateral primary osteoarthritis, right knee: Secondary | ICD-10-CM | POA: Diagnosis not present

## 2017-02-04 DIAGNOSIS — M25461 Effusion, right knee: Secondary | ICD-10-CM

## 2017-02-04 DIAGNOSIS — M25561 Pain in right knee: Secondary | ICD-10-CM | POA: Diagnosis not present

## 2017-02-04 DIAGNOSIS — J45909 Unspecified asthma, uncomplicated: Secondary | ICD-10-CM | POA: Insufficient documentation

## 2017-02-04 DIAGNOSIS — Y998 Other external cause status: Secondary | ICD-10-CM | POA: Insufficient documentation

## 2017-02-04 DIAGNOSIS — Y939 Activity, unspecified: Secondary | ICD-10-CM | POA: Insufficient documentation

## 2017-02-04 DIAGNOSIS — M25571 Pain in right ankle and joints of right foot: Secondary | ICD-10-CM | POA: Diagnosis not present

## 2017-02-04 DIAGNOSIS — W010XXA Fall on same level from slipping, tripping and stumbling without subsequent striking against object, initial encounter: Secondary | ICD-10-CM | POA: Insufficient documentation

## 2017-02-04 DIAGNOSIS — Y929 Unspecified place or not applicable: Secondary | ICD-10-CM | POA: Insufficient documentation

## 2017-02-04 DIAGNOSIS — Z760 Encounter for issue of repeat prescription: Secondary | ICD-10-CM | POA: Insufficient documentation

## 2017-02-04 MED ORDER — BECLOMETHASONE DIPROPIONATE 40 MCG/ACT IN AERS: 1.0000 | INHALATION_SPRAY | Freq: Two times a day (BID) | RESPIRATORY_TRACT | 0 refills | Status: AC

## 2017-02-04 MED ORDER — ALBUTEROL SULFATE HFA 108 (90 BASE) MCG/ACT IN AERS: 1.0000 | INHALATION_SPRAY | Freq: Four times a day (QID) | RESPIRATORY_TRACT | 0 refills | Status: DC | PRN

## 2017-02-04 MED ORDER — TRAMADOL HCL 50 MG PO TABS
50.0000 mg | ORAL_TABLET | Freq: Once | ORAL | Status: AC
  Administered 2017-02-04: 50 mg via ORAL
  Filled 2017-02-04: qty 1

## 2017-02-04 MED ORDER — TRAMADOL HCL 50 MG PO TABS: 50.0000 mg | ORAL_TABLET | Freq: Four times a day (QID) | ORAL | 0 refills | Status: DC | PRN

## 2017-02-04 NOTE — Discharge Instructions (Signed)
Take the medications as needed for pain.  He can also use ice to help with the swelling.  Follow-up with an orthopedic doctor for further evaluation

## 2017-02-04 NOTE — ED Notes (Signed)
No answer when called to reassess vitals x1 

## 2017-02-04 NOTE — ED Triage Notes (Signed)
Patient adds that her inhaler ran out at new year's and needs refill.

## 2017-02-04 NOTE — ED Notes (Signed)
Pt ambulated with crutches without assistance.

## 2017-02-04 NOTE — ED Notes (Signed)
Per registration patient left AMA

## 2017-02-04 NOTE — ED Triage Notes (Signed)
Patient reports that she was broughtin last night around 9pm and has been in lobby since but no one has called her name. Reviewed notes from night staff with patient in her chart that when she was called couple times there wasn't answer as to why her name was taken out.  Patient c/o of right leg pain with right knee swelling after fall last night.

## 2017-02-04 NOTE — ED Notes (Signed)
Pt unable to sign due to E-Signature not working.

## 2017-02-04 NOTE — ED Provider Notes (Signed)
[facility] DEPT Provider Note   CSN: [id number] Arrival date & time: 02/04/17  0830     History   Chief Complaint Chief Complaint  Patient presents with  . Fall  . Knee Pain  . Leg Pain  . Foot Pain  . Medication Refill    HPI Gabriella Butler is a 72 y.o. female.  HPI Patient presents to the emergency room for evaluation of a knee injury.  Patient slipped and fell injuring her right knee yesterday.  Since then she is noticed swelling in her right knee.  She is having pain from her knee down to her ankle.  She denies any fevers or chills.  No numbness or weakness.  Patient also states she ran out of her inhaler and would like a refill.  She denies any breathing difficulties. Past Medical History:  Diagnosis Date  . Asthma   . Bronchitis   . Gout     There are no active problems to display for this patient.   History reviewed. No pertinent surgical history.  OB History    No data available       Home Medications    Prior to Admission medications   Medication Sig Start Date End Date Taking? Authorizing Provider  albuterol (PROVENTIL HFA;VENTOLIN HFA) 108 (90 Base) MCG/ACT inhaler Inhale 1-2 puffs into the lungs every 6 (six) hours as needed for wheezing or shortness of breath. 02/04/17   Dorie Rank, MD  azithromycin (ZITHROMAX) 250 MG tablet Take 1 tablet (250 mg total) by mouth daily. Take first 2 tablets together, then 1 every day until finished. Patient not taking: Reported on 09/14/2016 08/08/16   Caccavale, Sophia, PA-C  beclomethasone (QVAR) 40 MCG/ACT inhaler Inhale 1 puff into the lungs 2 (two) times daily. 02/04/17   Dorie Rank, MD  dextromethorphan-guaiFENesin [facility] DM) 30-600 MG 12hr tablet Take 1 tablet by mouth 2 (two) times daily. Patient not taking: Reported on 09/14/2016 09/02/15   Randal Buba, April, MD  doxycycline (VIBRAMYCIN) 100 MG capsule Take 1 capsule (100 mg total) by mouth 2 (two) times daily. One po bid x 7 days Patient  not taking: Reported on 09/14/2016 09/02/15   Palumbo, April, MD  penicillin v potassium (VEETID) 500 MG tablet Take 1 tablet (500 mg total) by mouth 4 (four) times daily. Patient not taking: Reported on 08/02/2014 03/02/13   [name], MD  predniSONE (DELTASONE) 20 MG tablet 3 tabs po day one, then 2 po daily x 4 days Patient not taking: Reported on 09/14/2016 09/02/15   Randal Buba, April, MD  pseudoephedrine (SUDAFED 12 HOUR) 120 MG 12 hr tablet Take 1 tablet (120 mg total) by mouth 2 (two) times daily. Patient not taking: Reported on 09/01/2015 08/02/14   [name], NP  traMADol (ULTRAM) 50 MG tablet Take 1 tablet (50 mg total) by mouth every 6 (six) hours as needed. 02/04/17   Dorie Rank, MD    Family History No family history on file.  Social History Social History   Tobacco Use  . Smoking status: Never Smoker  . Smokeless tobacco: Never Used  Substance Use Topics  . Alcohol use: No  . Drug use: No     Allergies   Patient has no known allergies.   Review of Systems Review of Systems  All other systems reviewed and are negative.    Physical Exam Updated Vital Signs BP (!) 142/67 (BP Location: Left Arm)   Pulse 80   Temp 98.5 F (36.9 C) (Oral)  Resp 20   SpO2 100%   Physical Exam  Constitutional: She appears well-developed and well-nourished. No distress.  HENT:  Head: Normocephalic and atraumatic.  Right Ear: External ear normal.  Left Ear: External ear normal.  Eyes: Conjunctivae are normal. Right eye exhibits no discharge. Left eye exhibits no discharge. No scleral icterus.  Neck: Neck supple. No tracheal deviation present.  Cardiovascular: Normal rate and regular rhythm.  Pulmonary/Chest: Effort normal and breath sounds normal. No respiratory distress. She has no wheezes.  Abdominal: She exhibits no distension.  Musculoskeletal: She exhibits tenderness. She exhibits no edema.       Right knee: She exhibits decreased range of motion, swelling and effusion. She  exhibits no deformity, no laceration and no erythema. Tenderness found.       Right ankle: She exhibits normal range of motion and no swelling. Tenderness. Lateral malleolus and medial malleolus tenderness found.  Neurological: She is alert. Cranial nerve deficit: no gross deficits.  Skin: Skin is warm and dry. No rash noted.  Psychiatric: She has a normal mood and affect.  Nursing note and vitals reviewed.    ED Treatments / Results  Labs (all labs ordered are listed, but only abnormal results are displayed) Labs Reviewed - No data to display   Radiology Dg Ankle Complete Right  Result Date: 02/04/2017 CLINICAL DATA:  Acute right ankle pain following fall today. Initial encounter. EXAM: RIGHT ANKLE - COMPLETE 3+ VIEW COMPARISON:  None. FINDINGS: There is no evidence of acute fracture, subluxation or dislocation. Soft tissue swelling is present. A calcaneal spur is present. No suspicious focal bony lesions are identified. IMPRESSION: Soft tissue swelling without acute bony abnormality. Electronically Signed   By: Margarette Canada M.D.   On: 02/04/2017 10:51   Dg Knee Complete 4 Views Right  Result Date: 02/04/2017 CLINICAL DATA:  Patient with knee pain status post fall. EXAM: RIGHT KNEE - COMPLETE 4+ VIEW COMPARISON:  None. FINDINGS: Tricompartmental degenerative changes. Nonstandard positioning limits evaluation. No definite evidence for acute displaced fracture. No joint effusion. IMPRESSION: Nonstandard positioning limits evaluation. No evidence for displaced fracture. Tricompartmental degenerative changes. Electronically Signed   By: Lovey Newcomer M.D.   On: 02/04/2017 09:20    Procedures Procedures (including critical care time)  Medications Ordered in ED Medications  traMADol (ULTRAM) tablet 50 mg (50 mg Oral Given 02/04/17 1102)     Initial Impression / Assessment and Plan / ED Course  I have reviewed the triage vital signs and the nursing notes.  Pertinent labs & imaging results  that were available during my care of the patient were reviewed by me and considered in my medical decision making (see chart for details).   No fx on xray.  No signs to suggest infection.  Doubt gout flare.  Suspect sprain, strain injury associated with OA and knee effusion.  Dc home with pain meds.  Follow up with ortho.  Cruches and knee sleeve as needed for support  Final Clinical Impressions(s) / ED Diagnoses   Final diagnoses:  Osteoarthritis of right knee, unspecified osteoarthritis type  Effusion of right knee  Sprain of right ankle, unspecified ligament, initial encounter    ED Discharge Orders        Ordered    traMADol (ULTRAM) 50 MG tablet  Every 6 hours PRN     02/04/17 1104    albuterol (PROVENTIL HFA;VENTOLIN HFA) 108 (90 Base) MCG/ACT inhaler  Every 6 hours PRN     02/04/17 1104  beclomethasone (QVAR) 40 MCG/ACT inhaler  2 times daily     02/04/17 1104       Dorie Rank, MD 02/04/17 1108

## 2017-07-12 ENCOUNTER — Encounter (HOSPITAL_COMMUNITY): Payer: Self-pay | Admitting: Emergency Medicine

## 2017-07-12 ENCOUNTER — Ambulatory Visit (HOSPITAL_COMMUNITY)
Admission: EM | Admit: 2017-07-12 | Discharge: 2017-07-12 | Disposition: A | Discharge status: Home / Self Care | Payer: Medicare Other | Attending: Family Medicine | Admitting: Family Medicine

## 2017-07-12 ENCOUNTER — Other Ambulatory Visit: Payer: Self-pay

## 2017-07-12 DIAGNOSIS — J019 Acute sinusitis, unspecified: Secondary | ICD-10-CM

## 2017-07-12 MED ORDER — CETIRIZINE HCL 10 MG PO CAPS: 10.0000 mg | ORAL_CAPSULE | Freq: Every day | ORAL | 0 refills | Status: DC

## 2017-07-12 MED ORDER — ACETAMINOPHEN 325 MG PO TABS
650.0000 mg | ORAL_TABLET | Freq: Once | ORAL | Status: AC
  Administered 2017-07-12: 650 mg via ORAL

## 2017-07-12 MED ORDER — PREDNISONE 50 MG PO TABS: 50.0000 mg | ORAL_TABLET | Freq: Every day | ORAL | 0 refills | Status: AC

## 2017-07-12 MED ORDER — AMOXICILLIN-POT CLAVULANATE 875-125 MG PO TABS: 1.0000 | ORAL_TABLET | Freq: Two times a day (BID) | ORAL | 0 refills | Status: AC

## 2017-07-12 MED ORDER — ACETAMINOPHEN 325 MG PO TABS
ORAL_TABLET | ORAL | Status: AC
  Filled 2017-07-12: qty 2

## 2017-07-12 NOTE — ED Provider Notes (Signed)
[facility]    CSN: [id number] Arrival date & time: 07/12/17  1643     History   Chief Complaint Chief Complaint  Patient presents with  . URI    HPI Gabriella Butler is a 72 y.o. female history of asthma, gout,Patient is presenting with URI symptoms- congestion, cough, bilateral ear pain.  Patient also noting significant head pressure patient's main complaints are persistence of symptoms. Symptoms have been going on for 1 month. Patient has tried Sudafed, with minimal relief.  Endorses low-grade fever, denies nausea, vomiting, diarrhea. Denies shortness of breath and chest pain.    HPI  Past Medical History:  Diagnosis Date  . Asthma   . Bronchitis   . Gout     There are no active problems to display for this patient.   History reviewed. No pertinent surgical history.  OB History   None      Home Medications    Prior to Admission medications   Medication Sig Start Date End Date Taking? Authorizing Provider  beclomethasone (QVAR) 40 MCG/ACT inhaler Inhale 1 puff into the lungs 2 (two) times daily. 02/04/17  Yes Dorie Rank, MD  traMADol (ULTRAM) 50 MG tablet Take 1 tablet (50 mg total) by mouth every 6 (six) hours as needed. 02/04/17  Yes Dorie Rank, MD  albuterol (PROVENTIL HFA;VENTOLIN HFA) 108 (90 Base) MCG/ACT inhaler Inhale 1-2 puffs into the lungs every 6 (six) hours as needed for wheezing or shortness of breath. 02/04/17   Dorie Rank, MD  amoxicillin-clavulanate (AUGMENTIN) 875-125 MG tablet Take 1 tablet by mouth every 12 (twelve) hours for 10 days. 07/12/17 07/22/17  Laurieanne Galloway C, PA-C  Cetirizine HCl 10 MG CAPS Take 1 capsule (10 mg total) by mouth daily for 10 days. 07/12/17 07/22/17  Alira Fretwell C, PA-C  predniSONE (DELTASONE) 50 MG tablet Take 1 tablet (50 mg total) by mouth daily for 5 days. 07/12/17 07/17/17  Nahuel Wilbert, Elesa Hacker, PA-C    Family History History reviewed. No pertinent family history.  Social History Social History   Tobacco Use   . Smoking status: Never Smoker  . Smokeless tobacco: Never Used  Substance Use Topics  . Alcohol use: No  . Drug use: No     Allergies   Patient has no known allergies.   Review of Systems Review of Systems  Constitutional: Positive for fatigue and fever. Negative for activity change, appetite change and chills.  HENT: Positive for congestion, ear pain, rhinorrhea and sinus pressure. Negative for sore throat and trouble swallowing.   Respiratory: Positive for cough. Negative for chest tightness and shortness of breath.   Cardiovascular: Negative for chest pain.  Gastrointestinal: Negative for abdominal pain, nausea and vomiting.  Musculoskeletal: Negative for myalgias.  Skin: Negative for rash.  Neurological: Negative for dizziness, light-headedness and headaches.     Physical Exam Triage Vital Signs ED Triage Vitals  Enc Vitals Group     BP 07/12/17 1709 121/73     Pulse Rate 07/12/17 1709 97     Resp --      Temp 07/12/17 1709 (!) 100.5 F (38.1 C)     Temp Source 07/12/17 1709 Oral     SpO2 07/12/17 1709 98 %     Weight --      Height --      Head Circumference --      Peak Flow --      Pain Score 07/12/17 1708 7     Pain Loc --  Pain Edu? --      Excl. in [place]? --    No data found.  Updated Vital Signs BP 121/73 (BP Location: Left Arm)   Pulse 97   Temp (!) 100.5 F (38.1 C) (Oral)   SpO2 98%   Visual Acuity Right Eye Distance:   Left Eye Distance:   Bilateral Distance:    Right Eye Near:   Left Eye Near:    Bilateral Near:     Physical Exam  Constitutional: She is oriented to person, place, and time. She appears well-developed and well-nourished. No distress.  HENT:  Head: Normocephalic and atraumatic.  Bilateral TMs blocked by cerumen, cerumen partially removed bilaterally.  Oral mucosa pink and moist, no tonsillar enlargement or exudate. Posterior pharynx patent and nonerythematous, no uvula deviation or swelling. Normal phonation.    Eyes: Conjunctivae are normal.  Neck: Neck supple.  Cardiovascular: Normal rate and regular rhythm.  No murmur heard. Pulmonary/Chest: Effort normal and breath sounds normal. No respiratory distress.  Breathing comfortably at rest, CTABL, no wheezing, rales or other adventitious sounds auscultated  Abdominal: Soft. There is no tenderness.  Musculoskeletal: She exhibits no edema.  Neurological: She is alert and oriented to person, place, and time.  Skin: Skin is warm and dry.  Psychiatric: She has a normal mood and affect.  Nursing note and vitals reviewed.    UC Treatments / Results  Labs (all labs ordered are listed, but only abnormal results are displayed) Labs Reviewed - No data to display  EKG None  Radiology No results found.  Procedures Procedures (including critical care time)  Medications Ordered in UC Medications  acetaminophen (TYLENOL) tablet 650 mg (650 mg Oral Given 07/12/17 1714)    Initial Impression / Assessment and Plan / UC Course  I have reviewed the triage vital signs and the nursing notes.  Pertinent labs & imaging results that were available during my care of the patient were reviewed by me and considered in my medical decision making (see chart for details).     Patient with 1 month of URI symptoms, low-grade fever in clinic today.  We will go ahead and treat for bacterial sinusitis with Augmentin, prednisone and Zyrtec.  Bilateral TMs not visualized, given treating with Augmentin to cover for possible otitis media.  Will have patient return if pain not resolving with treatment. Advised to use Debrox to break up further cerumen impaction.Discussed strict return precautions. Patient verbalized understanding and is agreeable with plan.  Final Clinical Impressions(s) / UC Diagnoses   Final diagnoses:  Acute sinusitis with symptoms > 10 days     Discharge Instructions     Please begin Augmentin twice daily for the next 10 days  Prednisone  daily for 5 days to help with sinus pressure and inflammation  Alternate Tylenol and ibuprofen every 4 hours for control of fever and pain  Please begin Zyrtec daily for congestion  Please use Debrox eardrops that you may get over-the-counter to help break up some of the wax in your ears; please return if your pain not improving with treatment   ED Prescriptions    Medication Sig Dispense Auth. Provider   amoxicillin-clavulanate (AUGMENTIN) 875-125 MG tablet Take 1 tablet by mouth every 12 (twelve) hours for 10 days. 20 tablet Kamylah Manzo C, PA-C   predniSONE (DELTASONE) 50 MG tablet Take 1 tablet (50 mg total) by mouth daily for 5 days. 5 tablet Tenaya Hilyer C, PA-C   Cetirizine HCl 10 MG CAPS Take 1  capsule (10 mg total) by mouth daily for 10 days. 10 capsule Jeno Calleros C, PA-C     Controlled Substance Prescriptions [place] Controlled Substance Registry consulted? Not Applicable   Janith Lima, [place] 07/12/17 1839

## 2017-07-12 NOTE — ED Triage Notes (Signed)
Pt reports nasal drainage, PND, and bilateral ear pain x1 month along with some head pressure.

## 2017-07-12 NOTE — Discharge Instructions (Signed)
Please begin Augmentin twice daily for the next 10 days  Prednisone daily for 5 days to help with sinus pressure and inflammation  Alternate Tylenol and ibuprofen every 4 hours for control of fever and pain  Please begin Zyrtec daily for congestion  Please use Debrox eardrops that you may get over-the-counter to help break up some of the wax in your ears; please return if your pain not improving with treatment

## 2017-12-06 ENCOUNTER — Emergency Department (HOSPITAL_COMMUNITY): Payer: Medicare Other

## 2017-12-06 ENCOUNTER — Inpatient Hospital Stay (HOSPITAL_COMMUNITY)
Admission: EM | Admit: 2017-12-06 | Discharge: 2017-12-07 | DRG: 439 | Disposition: A | Discharge status: Home / Self Care | Payer: Medicare Other | Attending: Internal Medicine | Admitting: Internal Medicine

## 2017-12-06 ENCOUNTER — Encounter (HOSPITAL_COMMUNITY): Payer: Self-pay | Admitting: *Deleted

## 2017-12-06 ENCOUNTER — Other Ambulatory Visit: Payer: Self-pay

## 2017-12-06 ENCOUNTER — Inpatient Hospital Stay (HOSPITAL_COMMUNITY): Payer: Medicare Other

## 2017-12-06 DIAGNOSIS — R945 Abnormal results of liver function studies: Secondary | ICD-10-CM | POA: Diagnosis not present

## 2017-12-06 DIAGNOSIS — R739 Hyperglycemia, unspecified: Secondary | ICD-10-CM | POA: Diagnosis not present

## 2017-12-06 DIAGNOSIS — R7303 Prediabetes: Secondary | ICD-10-CM | POA: Diagnosis present

## 2017-12-06 DIAGNOSIS — E876 Hypokalemia: Secondary | ICD-10-CM | POA: Diagnosis present

## 2017-12-06 DIAGNOSIS — J45909 Unspecified asthma, uncomplicated: Secondary | ICD-10-CM | POA: Diagnosis present

## 2017-12-06 DIAGNOSIS — K8020 Calculus of gallbladder without cholecystitis without obstruction: Secondary | ICD-10-CM | POA: Diagnosis not present

## 2017-12-06 DIAGNOSIS — Z6832 Body mass index (BMI) 32.0-32.9, adult: Secondary | ICD-10-CM | POA: Diagnosis not present

## 2017-12-06 DIAGNOSIS — R933 Abnormal findings on diagnostic imaging of other parts of digestive tract: Secondary | ICD-10-CM | POA: Diagnosis not present

## 2017-12-06 DIAGNOSIS — K810 Acute cholecystitis: Secondary | ICD-10-CM | POA: Diagnosis present

## 2017-12-06 DIAGNOSIS — K8050 Calculus of bile duct without cholangitis or cholecystitis without obstruction: Secondary | ICD-10-CM | POA: Diagnosis not present

## 2017-12-06 DIAGNOSIS — R748 Abnormal levels of other serum enzymes: Secondary | ICD-10-CM

## 2017-12-06 DIAGNOSIS — Z8249 Family history of ischemic heart disease and other diseases of the circulatory system: Secondary | ICD-10-CM

## 2017-12-06 DIAGNOSIS — R935 Abnormal findings on diagnostic imaging of other abdominal regions, including retroperitoneum: Secondary | ICD-10-CM | POA: Diagnosis not present

## 2017-12-06 DIAGNOSIS — Z79891 Long term (current) use of opiate analgesic: Secondary | ICD-10-CM | POA: Diagnosis not present

## 2017-12-06 DIAGNOSIS — Z79899 Other long term (current) drug therapy: Secondary | ICD-10-CM

## 2017-12-06 DIAGNOSIS — K851 Biliary acute pancreatitis without necrosis or infection: Secondary | ICD-10-CM | POA: Diagnosis not present

## 2017-12-06 DIAGNOSIS — K8000 Calculus of gallbladder with acute cholecystitis without obstruction: Secondary | ICD-10-CM | POA: Diagnosis present

## 2017-12-06 DIAGNOSIS — I10 Essential (primary) hypertension: Secondary | ICD-10-CM | POA: Diagnosis present

## 2017-12-06 DIAGNOSIS — E669 Obesity, unspecified: Secondary | ICD-10-CM | POA: Diagnosis present

## 2017-12-06 DIAGNOSIS — K8062 Calculus of gallbladder and bile duct with acute cholecystitis without obstruction: Secondary | ICD-10-CM | POA: Diagnosis present

## 2017-12-06 DIAGNOSIS — R7309 Other abnormal glucose: Secondary | ICD-10-CM | POA: Diagnosis not present

## 2017-12-06 DIAGNOSIS — Z9114 Patient's other noncompliance with medication regimen: Secondary | ICD-10-CM | POA: Diagnosis not present

## 2017-12-06 DIAGNOSIS — Z7951 Long term (current) use of inhaled steroids: Secondary | ICD-10-CM

## 2017-12-06 DIAGNOSIS — N179 Acute kidney failure, unspecified: Secondary | ICD-10-CM | POA: Diagnosis present

## 2017-12-06 DIAGNOSIS — R1013 Epigastric pain: Secondary | ICD-10-CM | POA: Diagnosis not present

## 2017-12-06 HISTORY — DX: Essential (primary) hypertension: I10

## 2017-12-06 LAB — URINALYSIS, ROUTINE W REFLEX MICROSCOPIC
Bilirubin Urine: NEGATIVE
Glucose, UA: >=500 mg/dL — AB
Hgb urine dipstick: NEGATIVE
Ketones, ur: NEGATIVE mg/dL
Nitrite: NEGATIVE
PROTEIN: 30 mg/dL — AB
Specific Gravity, Urine: 1.013 (ref 1.005–1.030)
pH: 8 (ref 5.0–8.0)

## 2017-12-06 LAB — CBC WITH DIFFERENTIAL/PLATELET
Abs Immature Granulocytes: 0.06 10*3/uL (ref 0.00–0.07)
BASOS ABS: 0 10*3/uL (ref 0.0–0.1)
BASOS PCT: 0 %
EOS ABS: 0 10*3/uL (ref 0.0–0.5)
Eosinophils Relative: 0 %
HCT: 40.3 % (ref 36.0–46.0)
Hemoglobin: 12.6 g/dL (ref 12.0–15.0)
IMMATURE GRANULOCYTES: 1 %
LYMPHS ABS: 0.7 10*3/uL (ref 0.7–4.0)
Lymphocytes Relative: 6 %
MCH: 28.6 pg (ref 26.0–34.0)
MCHC: 31.3 g/dL (ref 30.0–36.0)
MCV: 91.6 fL (ref 80.0–100.0)
Monocytes Absolute: 1.1 10*3/uL — ABNORMAL HIGH (ref 0.1–1.0)
Monocytes Relative: 8 %
NEUTROS PCT: 85 %
NRBC: 0 % (ref 0.0–0.2)
Neutro Abs: 11 10*3/uL — ABNORMAL HIGH (ref 1.7–7.7)
PLATELETS: 263 10*3/uL (ref 150–400)
RBC: 4.4 MIL/uL (ref 3.87–5.11)
RDW: 13.5 % (ref 11.5–15.5)
WBC: 12.9 10*3/uL — ABNORMAL HIGH (ref 4.0–10.5)

## 2017-12-06 LAB — COMPREHENSIVE METABOLIC PANEL
ALT: 67 U/L — ABNORMAL HIGH (ref 0–44)
AST: 98 U/L — AB (ref 15–41)
Albumin: 3.8 g/dL (ref 3.5–5.0)
Alkaline Phosphatase: 183 U/L — ABNORMAL HIGH (ref 38–126)
Anion gap: 8 (ref 5–15)
BUN: 9 mg/dL (ref 8–23)
CHLORIDE: 106 mmol/L (ref 98–111)
CO2: 27 mmol/L (ref 22–32)
Calcium: 9.2 mg/dL (ref 8.9–10.3)
Creatinine, Ser: 0.95 mg/dL (ref 0.44–1.00)
GFR calc Af Amer: >60 mL/min (ref >60)
GFR calc non Af Amer: 59 mL/min — ABNORMAL LOW (ref >60)
GLUCOSE: 190 mg/dL — AB (ref 70–99)
POTASSIUM: 3.4 mmol/L — AB (ref 3.5–5.1)
Sodium: 141 mmol/L (ref 135–145)
Total Bilirubin: 0.8 mg/dL (ref 0.3–1.2)
Total Protein: 8.2 g/dL — ABNORMAL HIGH (ref 6.5–8.1)

## 2017-12-06 LAB — CBC
HEMATOCRIT: 43.1 % (ref 36.0–46.0)
HEMOGLOBIN: 13.3 g/dL (ref 12.0–15.0)
MCH: 28.4 pg (ref 26.0–34.0)
MCHC: 30.9 g/dL (ref 30.0–36.0)
MCV: 92.1 fL (ref 80.0–100.0)
PLATELETS: 250 10*3/uL (ref 150–400)
RBC: 4.68 MIL/uL (ref 3.87–5.11)
RDW: 13.6 % (ref 11.5–15.5)
WBC: 6.8 10*3/uL (ref 4.0–10.5)
nRBC: 0 % (ref 0.0–0.2)

## 2017-12-06 LAB — HEPATIC FUNCTION PANEL
ALT: 97 U/L — AB (ref 0–44)
AST: 168 U/L — ABNORMAL HIGH (ref 15–41)
Albumin: 3.3 g/dL — ABNORMAL LOW (ref 3.5–5.0)
Alkaline Phosphatase: 189 U/L — ABNORMAL HIGH (ref 38–126)
BILIRUBIN INDIRECT: 0.7 mg/dL (ref 0.3–0.9)
Bilirubin, Direct: 0.7 mg/dL — ABNORMAL HIGH (ref 0.0–0.2)
TOTAL PROTEIN: 7.4 g/dL (ref 6.5–8.1)
Total Bilirubin: 1.4 mg/dL — ABNORMAL HIGH (ref 0.3–1.2)

## 2017-12-06 LAB — BASIC METABOLIC PANEL
ANION GAP: 8 (ref 5–15)
BUN: 7 mg/dL — ABNORMAL LOW (ref 8–23)
CO2: 28 mmol/L (ref 22–32)
Calcium: 9.1 mg/dL (ref 8.9–10.3)
Chloride: 106 mmol/L (ref 98–111)
Creatinine, Ser: 0.88 mg/dL (ref 0.44–1.00)
GFR calc Af Amer: >60 mL/min (ref >60)
GLUCOSE: 138 mg/dL — AB (ref 70–99)
POTASSIUM: 3.9 mmol/L (ref 3.5–5.1)
Sodium: 142 mmol/L (ref 135–145)

## 2017-12-06 LAB — GLUCOSE, CAPILLARY
GLUCOSE-CAPILLARY: 116 mg/dL — AB (ref 70–99)
GLUCOSE-CAPILLARY: 115 mg/dL — AB (ref 70–99)
Glucose-Capillary: 140 mg/dL — ABNORMAL HIGH (ref 70–99)
Glucose-Capillary: 117 mg/dL — ABNORMAL HIGH (ref 70–99)

## 2017-12-06 LAB — LIPASE, BLOOD: LIPASE: 266 U/L — AB (ref 11–51)

## 2017-12-06 LAB — PROTIME-INR
INR: 0.94
Prothrombin Time: 12.5 seconds (ref 11.4–15.2)

## 2017-12-06 LAB — TROPONIN I: TROPONIN I: 0.03 ng/mL — AB (ref <0.03)

## 2017-12-06 LAB — TRIGLYCERIDES: Triglycerides: 92 mg/dL (ref <150)

## 2017-12-06 LAB — HEMOGLOBIN A1C
HEMOGLOBIN A1C: 6.2 % — AB (ref 4.8–5.6)
MEAN PLASMA GLUCOSE: 131.24 mg/dL

## 2017-12-06 LAB — MAGNESIUM: Magnesium: 1.7 mg/dL (ref 1.7–2.4)

## 2017-12-06 MED ORDER — ONDANSETRON HCL 4 MG PO TABS: 4.0000 mg | ORAL_TABLET | Freq: Four times a day (QID) | ORAL | Status: DC | PRN

## 2017-12-06 MED ORDER — SODIUM CHLORIDE 0.9 % IV BOLUS
1000.0000 mL | Freq: Once | INTRAVENOUS | Status: AC
  Administered 2017-12-06: 1000 mL via INTRAVENOUS

## 2017-12-06 MED ORDER — MORPHINE SULFATE (PF) 4 MG/ML IV SOLN
4.0000 mg | Freq: Once | INTRAVENOUS | Status: AC
  Administered 2017-12-06: 4 mg via INTRAVENOUS
  Filled 2017-12-06: qty 1

## 2017-12-06 MED ORDER — INSULIN ASPART 100 UNIT/ML ~~LOC~~ SOLN
0.0000 [IU] | SUBCUTANEOUS | Status: DC
  Administered 2017-12-06: 1 [IU] via SUBCUTANEOUS

## 2017-12-06 MED ORDER — LACTATED RINGERS IV SOLN
INTRAVENOUS | Status: AC
  Administered 2017-12-06 (×2): via INTRAVENOUS

## 2017-12-06 MED ORDER — POTASSIUM CHLORIDE 10 MEQ/100ML IV SOLN: 10.0000 meq | Freq: Once | INTRAVENOUS | Status: DC

## 2017-12-06 MED ORDER — ACETAMINOPHEN 325 MG PO TABS: 650.0000 mg | ORAL_TABLET | Freq: Four times a day (QID) | ORAL | Status: DC | PRN

## 2017-12-06 MED ORDER — MORPHINE SULFATE (PF) 2 MG/ML IV SOLN: 1.0000 mg | INTRAVENOUS | Status: DC | PRN

## 2017-12-06 MED ORDER — ONDANSETRON HCL 4 MG/2ML IJ SOLN: 4.0000 mg | Freq: Four times a day (QID) | INTRAMUSCULAR | Status: DC | PRN

## 2017-12-06 MED ORDER — ACETAMINOPHEN 650 MG RE SUPP: 650.0000 mg | Freq: Four times a day (QID) | RECTAL | Status: DC | PRN

## 2017-12-06 MED ORDER — ONDANSETRON 4 MG PO TBDP: 4.0000 mg | ORAL_TABLET | Freq: Once | ORAL | Status: DC | PRN

## 2017-12-06 MED ORDER — BECLOMETHASONE DIPROPIONATE 40 MCG/ACT IN AERS: 1.0000 | INHALATION_SPRAY | Freq: Two times a day (BID) | RESPIRATORY_TRACT | Status: DC

## 2017-12-06 MED ORDER — POTASSIUM CHLORIDE 10 MEQ/100ML IV SOLN: 10.0000 meq | INTRAVENOUS | Status: DC

## 2017-12-06 MED ORDER — ALBUTEROL SULFATE (2.5 MG/3ML) 0.083% IN NEBU: 2.5000 mg | INHALATION_SOLUTION | RESPIRATORY_TRACT | Status: DC | PRN

## 2017-12-06 MED ORDER — ONDANSETRON HCL 4 MG/2ML IJ SOLN
4.0000 mg | Freq: Once | INTRAMUSCULAR | Status: AC
Start: 2017-12-06 — End: 2017-12-06
  Administered 2017-12-06: 4 mg via INTRAVENOUS
  Filled 2017-12-06: qty 2

## 2017-12-06 MED ORDER — PIPERACILLIN-TAZOBACTAM 3.375 G IVPB
3.3750 g | Freq: Three times a day (TID) | INTRAVENOUS | Status: DC
  Administered 2017-12-06 – 2017-12-07 (×4): 3.375 g via INTRAVENOUS
  Filled 2017-12-06 (×4): qty 50

## 2017-12-06 MED ORDER — BUDESONIDE 0.25 MG/2ML IN SUSP
0.2500 mg | Freq: Two times a day (BID) | RESPIRATORY_TRACT | Status: DC
  Administered 2017-12-06 – 2017-12-07 (×2): 0.25 mg via RESPIRATORY_TRACT
  Filled 2017-12-06 (×2): qty 2

## 2017-12-06 MED ORDER — ONDANSETRON HCL 4 MG/2ML IJ SOLN
4.0000 mg | Freq: Once | INTRAMUSCULAR | Status: AC | PRN
  Administered 2017-12-06: 4 mg via INTRAVENOUS
  Filled 2017-12-06: qty 2

## 2017-12-06 NOTE — ED Provider Notes (Signed)
[facility] DEPT Provider Note   CSN: [id number] Arrival date & time: 12/06/17  5053     History   Chief Complaint Chief Complaint  Patient presents with  . Flank Pain    right  . Emesis  . Abdominal Pain    HPI Gabriella Butler is a 72 y.o. female.  The history is provided by the patient and a relative.  She has history of hypertension, asthma, gout and comes in complaining of right upper quadrant pain and right flank pain.  She had been complaining of right upper quadrant pain for the last 2-3 days.  Tonight, she started complaining of pain in the right flank.  There has been associated nausea and vomiting.  Pain is not improved after vomiting.  He has had chills but no fever or sweats.  There has been no diarrhea.  She has never had pain like this before.  She rates her pain at 6/10.  Of note, she has no history of abdominal surgeries.  Past Medical History:  Diagnosis Date  . Asthma   . Bronchitis   . Gout   . Hypertension     There are no active problems to display for this patient.   History reviewed. No pertinent surgical history.   OB History   None      Home Medications    Prior to Admission medications   Medication Sig Start Date End Date Taking? Authorizing Provider  albuterol (PROVENTIL HFA;VENTOLIN HFA) 108 (90 Base) MCG/ACT inhaler Inhale 1-2 puffs into the lungs every 6 (six) hours as needed for wheezing or shortness of breath. 02/04/17   Dorie Rank, MD  beclomethasone (QVAR) 40 MCG/ACT inhaler Inhale 1 puff into the lungs 2 (two) times daily. 02/04/17   Dorie Rank, MD  Cetirizine HCl 10 MG CAPS Take 1 capsule (10 mg total) by mouth daily for 10 days. 07/12/17 07/22/17  Wieters, Hallie C, PA-C  traMADol (ULTRAM) 50 MG tablet Take 1 tablet (50 mg total) by mouth every 6 (six) hours as needed. 02/04/17   Dorie Rank, MD    Family History No family history on file.  Social History Social History   Tobacco Use  . Smoking  status: Never Smoker  . Smokeless tobacco: Never Used  Substance Use Topics  . Alcohol use: No  . Drug use: No     Allergies   Patient has no known allergies.   Review of Systems Review of Systems  All other systems reviewed and are negative.    Physical Exam Updated Vital Signs BP (!) 171/76   Pulse 78   Temp (!) 97.5 F (36.4 C) (Oral)   Resp (!) 28   Ht 5\' 6"  (1.676 m)   Wt 90.7 kg   SpO2 98%   BMI 32.28 kg/m   Physical Exam  Nursing note and vitals reviewed.  72 year old female, resting comfortably and in no acute distress. Vital signs are significant for elevated blood pressure and elevated respiratory rate. Oxygen saturation is 98%, which is normal. Head is normocephalic and atraumatic. PERRLA, EOMI. Oropharynx is clear. Neck is nontender and supple without adenopathy or JVD. Back is nontender in the midline.  There is mild right CVA tenderness. Lungs are clear without rales, wheezes, or rhonchi. Chest is nontender. Heart has regular rate and rhythm without murmur. Abdomen is soft, flat, with moderate right upper quadrant tenderness.  She will not cooperate to evaluate for [facility] sign.  There is no rebound or guarding.  There are no masses or hepatosplenomegaly and peristalsis is hypoactive. Extremities have no cyanosis or edema, full range of motion is present. Skin is warm and dry without rash. Neurologic: Mental status is normal, cranial nerves are intact, there are no motor or sensory deficits.  ED Treatments / Results  Labs (all labs ordered are listed, but only abnormal results are displayed) Labs Reviewed  URINALYSIS, ROUTINE W REFLEX MICROSCOPIC - Abnormal; Notable for the following components:      Result Value   APPearance HAZY (*)    Glucose, UA >=500 (*)    Protein, ur 30 (*)    Leukocytes, UA TRACE (*)    Bacteria, UA RARE (*)    All other components within normal limits  LIPASE, BLOOD - Abnormal; Notable for the following components:    Lipase 266 (*)    All other components within normal limits  COMPREHENSIVE METABOLIC PANEL - Abnormal; Notable for the following components:   Potassium 3.4 (*)    Glucose, Bld 190 (*)    Total Protein 8.2 (*)    AST 98 (*)    ALT 67 (*)    Alkaline Phosphatase 183 (*)    GFR calc non Af Amer 59 (*)    All other components within normal limits  CBC   Radiology US Abdomen Limited  Result Date: 12/06/2017 CLINICAL DATA:  Right upper quadrant pain. Nausea and vomiting today. Elevated lipase and transaminases EXAM: ULTRASOUND ABDOMEN LIMITED RIGHT UPPER QUADRANT COMPARISON:  None. FINDINGS: Gallbladder: Cholelithiasis with multiple stones in the gallbladder measuring up to about 9 mm diameter. No gallbladder wall thickening. Murphy's sign is positive. Common bile duct: Diameter: 7 mm, upper limits of normal. Liver: Diffusely increased parenchymal echotexture likely representing fatty infiltration. No focal lesions identified. Portal vein is patent on color Doppler imaging with normal direction of blood flow towards the liver. IMPRESSION: Cholelithiasis with positive Murphy's sign. These findings suggest acute cholecystitis in the appropriate clinical setting. Electronically Signed   By: Lucienne Capers M.D.   On: 12/06/2017 05:38    Procedures Procedures  Medications Ordered in ED Medications  ondansetron (ZOFRAN) injection 4 mg (4 mg Intravenous Given 12/06/17 0419)  sodium chloride 0.9 % bolus 1,000 mL (0 mLs Intravenous Stopped 12/06/17 0551)  morphine 4 MG/ML injection 4 mg (4 mg Intravenous Given 12/06/17 0500)  ondansetron (ZOFRAN) injection 4 mg (4 mg Intravenous Given 12/06/17 0500)  morphine 4 MG/ML injection 4 mg (4 mg Intravenous Given 12/06/17 0610)     Initial Impression / Assessment and Plan / ED Course  I have reviewed the triage vital signs and the nursing notes.  Pertinent labs & imaging results that were available during my care of the patient were reviewed by me and  considered in my medical decision making (see chart for details).  Right upper quadrant and flank pain worrisome for biliary tract disease.  Labs had been ordered prior to my seeing the patient and show elevated lipase to 266, with mild elevation of ALT, AST, alkaline phosphatase and normal bilirubin.  Overall picture is suspicious for gallstone pancreatitis.  Also, she is noted to have elevated glucose to 190.  This may be new onset type 2 diabetes.  She has been given IV fluids, morphine, ondansetron and will be sent for right upper quadrant ultrasound.  Old records are reviewed, and she has no relevant past visits.  Ultrasound shows cholelithiasis with positive sonographic Murphy sign but normal gallbladder wall thickness and no pericholecystic fluid.  Common  bile duct is normal in caliber without any stones visible.  She did not get adequate relief with morphine and is given a second dose of morphine.  Case is discussed with Dr. Excell Seltzer of surgery service who requests that patient be admitted to the medicine service.  He states that he will follow her as a consult.  Case discussed with Dr. Hal Hope of [facility], who agrees to admit the patient.  Final Clinical Impressions(s) / ED Diagnoses   Final diagnoses:  Acute gallstone pancreatitis  Elevated glucose level  Elevated liver enzymes    ED Discharge Orders    None       Delora Fuel, MD 93/79/02 [phone]

## 2017-12-06 NOTE — H&P (Signed)
History and Physical    Gabriella Butler VEH:[id number] DOB: 03-26-1945 DOA: 12/06/2017  PCP: Patient, No Pcp Per  Patient coming from: Home.  Chief Complaint: Abdominal pain.  HPI: Gabriella Butler is a 72 y.o. female with history of asthma and hypertension presents to the ER with complaints of worsening abdominal pain.  Patient has been having abdominal pain for last few days increased on eating.  Subjective feeling of fever and chills.  Last night the pain worsened with radiation to the back.  Has been having multiple episodes of vomiting.  Denies any blood in the vomitus.  Denies any diarrhea.  Pain in the abdomen is mostly in the epigastric and right upper quadrant.  Denies any chest pain or shortness of breath.  Denies drinking alcohol.  ED Course: In the ER patient has epigastric tenderness and sonogram of the abdomen shows gallstones and features concerning for acute cholecystitis.  Labs reveal elevated LFTs and elevated lipase concerning for gallstone pancreatitis.  On-call general surgeon Dr. Excell Seltzer was consulted and Dr. Excell Seltzer advised hospital admission.  Patient started on fluids and pain relief medication.  Review of Systems: As per HPI, rest all negative.   Past Medical History:  Diagnosis Date  . Asthma   . Bronchitis   . Gout   . Hypertension     History reviewed. No pertinent surgical history.   reports that she has never smoked. She has never used smokeless tobacco. She reports that she does not drink alcohol or use drugs.  No Known Allergies  Family History  Problem Relation Age of Onset  . Hypertension Father     Prior to Admission medications   Medication Sig Start Date End Date Taking? Authorizing Provider  albuterol (PROVENTIL HFA;VENTOLIN HFA) 108 (90 Base) MCG/ACT inhaler Inhale 1-2 puffs into the lungs every 6 (six) hours as needed for wheezing or shortness of breath. 02/04/17  Yes Dorie Rank, MD  beclomethasone (QVAR) 40 MCG/ACT inhaler Inhale 1 puff into  the lungs 2 (two) times daily. 02/04/17  Yes Dorie Rank, MD  Cetirizine HCl 10 MG CAPS Take 1 capsule (10 mg total) by mouth daily for 10 days. Patient not taking: Reported on 12/06/2017 07/12/17 07/22/17  Wieters, Hallie C, PA-C  traMADol (ULTRAM) 50 MG tablet Take 1 tablet (50 mg total) by mouth every 6 (six) hours as needed. Patient not taking: Reported on 12/06/2017 02/04/17   Dorie Rank, MD    Physical Exam: Vitals:   12/06/17 0400 12/06/17 0430 12/06/17 0500 12/06/17 0530  BP: (!) 171/76 (!) 178/80 (!) 172/90 (!) 186/98  Pulse: 78 74 76 79  Resp: (!) 28 (!) 24 (!) 27 (!) 21  Temp:      TempSrc:      SpO2: 98% 97% 97% 100%  Weight:      Height:          Constitutional: Moderately built and nourished. Vitals:   12/06/17 0400 12/06/17 0430 12/06/17 0500 12/06/17 0530  BP: (!) 171/76 (!) 178/80 (!) 172/90 (!) 186/98  Pulse: 78 74 76 79  Resp: (!) 28 (!) 24 (!) 27 (!) 21  Temp:      TempSrc:      SpO2: 98% 97% 97% 100%  Weight:      Height:       Eyes: Anicteric no pallor. ENMT: No discharge from the ears eyes nose or mouth. Neck: No mass felt.  No neck rigidity. Respiratory: No rhonchi or crepitations. Cardiovascular: S1-S2 heard no murmurs appreciated. Abdomen: At  the time of my exam patient received morphine in place and the abdomen is nontender.  No guarding or rigidity no rebound tenderness. Musculoskeletal: No edema. Skin: No rash. Neurologic: Alert awake oriented to time place and person.  Moves all extremities. Psychiatric: Appears normal.  Normal affect.   Labs on Admission: I have personally reviewed following labs and imaging studies  CBC: Recent Labs  Lab 12/06/17 0325  WBC 6.8  HGB 13.3  HCT 43.1  MCV 92.1  PLT 778   Basic Metabolic Panel: Recent Labs  Lab 12/06/17 0325  NA 141  K 3.4*  CL 106  CO2 27  GLUCOSE 190*  BUN 9  CREATININE 0.95  CALCIUM 9.2   GFR: Estimated Creatinine Clearance: 61.7 mL/min (by C-G formula based on SCr of 0.95  mg/dL). Liver Function Tests: Recent Labs  Lab 12/06/17 0325  AST 98*  ALT 67*  ALKPHOS 183*  BILITOT 0.8  PROT 8.2*  ALBUMIN 3.8   Recent Labs  Lab 12/06/17 0325  LIPASE 266*   No results for input(s): AMMONIA in the last 168 hours. Coagulation Profile: No results for input(s): INR, PROTIME in the last 168 hours. Cardiac Enzymes: No results for input(s): CKTOTAL, CKMB, CKMBINDEX, TROPONINI in the last 168 hours. BNP (last 3 results) No results for input(s): PROBNP in the last 8760 hours. HbA1C: No results for input(s): HGBA1C in the last 72 hours. CBG: No results for input(s): GLUCAP in the last 168 hours. Lipid Profile: No results for input(s): CHOL, HDL, LDLCALC, TRIG, CHOLHDL, LDLDIRECT in the last 72 hours. Thyroid Function Tests: No results for input(s): TSH, T4TOTAL, FREET4, T3FREE, THYROIDAB in the last 72 hours. Anemia Panel: No results for input(s): VITAMINB12, FOLATE, FERRITIN, TIBC, IRON, RETICCTPCT in the last 72 hours. Urine analysis:    Component Value Date/Time   COLORURINE YELLOW 12/06/2017 0307   APPEARANCEUR HAZY (A) 12/06/2017 0307   LABSPEC 1.013 12/06/2017 0307   PHURINE 8.0 12/06/2017 0307   GLUCOSEU >=500 (A) 12/06/2017 0307   HGBUR NEGATIVE 12/06/2017 0307   BILIRUBINUR NEGATIVE 12/06/2017 0307   KETONESUR NEGATIVE 12/06/2017 0307   PROTEINUR 30 (A) 12/06/2017 0307   NITRITE NEGATIVE 12/06/2017 0307   LEUKOCYTESUR TRACE (A) 12/06/2017 0307   Sepsis Labs: @LABRCNTIP (procalcitonin:4,lacticidven:4) )No results found for this or any previous visit (from the past 240 hour(s)).   Radiological Exams on Admission: US Abdomen Limited  Result Date: 12/06/2017 CLINICAL DATA:  Right upper quadrant pain. Nausea and vomiting today. Elevated lipase and transaminases EXAM: ULTRASOUND ABDOMEN LIMITED RIGHT UPPER QUADRANT COMPARISON:  None. FINDINGS: Gallbladder: Cholelithiasis with multiple stones in the gallbladder measuring up to about 9 mm diameter.  No gallbladder wall thickening. Murphy's sign is positive. Common bile duct: Diameter: 7 mm, upper limits of normal. Liver: Diffusely increased parenchymal echotexture likely representing fatty infiltration. No focal lesions identified. Portal vein is patent on color Doppler imaging with normal direction of blood flow towards the liver. IMPRESSION: Cholelithiasis with positive Murphy's sign. These findings suggest acute cholecystitis in the appropriate clinical setting. Electronically Signed   By: Lucienne Capers M.D.   On: 12/06/2017 05:38     Assessment/Plan Principal Problem:   Acute pancreatitis Active Problems:   Acute cholecystitis   Essential hypertension   Asthma    1. Acute gallstone pancreatitis with acute cholecystitis -General surgery has been consulted.  Patient placed on empiric antibiotics.  Check MRCP for any CBD stone.  Patient will be kept n.p.o.  Aggressive hydration.  Pain relief medications.  Closely  follow metabolic panel intake output.  Check triglycerides.  Troponin is pending. 2. Hypertension -has not been taking her medications.  For now kept patient on PRN IV hydralazine. 3. History of asthma presently not wheezing. 4. Hyperglycemia -check hemoglobin A1c. 5. Mild hypokalemia-likely from vomiting.  Replace and recheck.   DVT prophylaxis: SCDs in anticipation of surgery. Code Status: Full code. Family Communication: Discussed with patient's daughter. Disposition Plan: Home. Consults called: General surgery. Admission status: Inpatient.   Rise Patience MD [facility] Pager [phone].  If 7PM-7AM, please contact night-coverage [email or web address] Password [facility]  12/06/2017, 6:34 AM

## 2017-12-06 NOTE — Consult Note (Signed)
[facility] Admission Note  Ikran Patman [birth year]  [id number].    Requesting MD: Adrienne Mocha, MD Chief Complaint/Reason for Consult: gallstone pancreatitis   HPI:  History of hypertension, asthma, and bronchitis who presented to [facility] emergency department with a chief complaint of epigastric pain.  Patient states yesterday evening when she was trying to go to bed she had epigastric and right upper back pain that prevented her from being able to go to sleep. Shortly after the patient developed nausea and vomiting.  Pain rated as sharp, severe, denies aggravating or alleviating factors. Patient reports similar pain in the past off and on for about 1 year, but the pain usually went away on its own.  Denies any known history of gallstones.  Patient denies tobacco use and states she is a never smoker.  Denies any past history of abdominal or other surgeries.  ROS: Review of Systems  Constitutional: Positive for chills.  Gastrointestinal: Positive for abdominal pain, nausea and vomiting. Negative for blood in stool.  All other systems reviewed and are negative.   Family History  Problem Relation Age of Onset  . Hypertension Father     Past Medical History:  Diagnosis Date  . Asthma   . Bronchitis   . Gout   . Hypertension     History reviewed. No pertinent surgical history.  Social History:  reports that she has never smoked. She has never used smokeless tobacco. She reports that she does not drink alcohol or use drugs.  Allergies: No Known Allergies  Medications Prior to Admission  Medication Sig Dispense Refill  . albuterol (PROVENTIL HFA;VENTOLIN HFA) 108 (90 Base) MCG/ACT inhaler Inhale 1-2 puffs into the lungs every 6 (six) hours as needed for wheezing or shortness of breath. 1 Inhaler 0  . beclomethasone (QVAR) 40 MCG/ACT inhaler Inhale 1 puff into the lungs 2 (two) times daily. 1 Inhaler 0  . Cetirizine HCl 10 MG CAPS Take 1 capsule (10 mg total) by  mouth daily for 10 days. (Patient not taking: Reported on 12/06/2017) 10 capsule 0  . traMADol (ULTRAM) 50 MG tablet Take 1 tablet (50 mg total) by mouth every 6 (six) hours as needed. (Patient not taking: Reported on 12/06/2017) 15 tablet 0    Blood pressure 129/75, pulse 79, temperature 99.1 F (37.3 C), temperature source Oral, resp. rate 18, height _0  (1.676 m), weight 90.7 kg, SpO2 100 %. Physical Exam: Physical Exam  Constitutional: She is oriented to person, place, and time. She appears well-developed and well-nourished.  Non-toxic appearance. She does not appear ill.  Obese  HENT:  Head: Normocephalic and atraumatic.  Eyes: Pupils are equal, round, and reactive to light. No scleral icterus.  Cardiovascular: Normal rate, regular rhythm and intact distal pulses.  No murmur heard. Pulmonary/Chest: Effort normal and breath sounds normal. No respiratory distress. She has no rhonchi. She has no rales. She exhibits no tenderness.  Abdominal: Normal appearance. Bowel sounds are decreased. There is no tenderness. There is negative Murphy's sign.  Neurological: She is alert and oriented to person, place, and time.  Skin: Skin is warm and dry. No rash noted.  Psychiatric: She has a normal mood and affect. Her behavior is normal.    Results for orders placed or performed during the hospital encounter of 12/06/17 (from the past 48 hour(s))  Urinalysis, Routine w reflex microscopic- may I&O cath if menses     Status: Abnormal   Collection Time: 12/06/17  3:07 AM  Result Value Ref  Range   Color, Urine YELLOW YELLOW   APPearance HAZY (A) CLEAR   Specific Gravity, Urine 1.013 1.005 - 1.030   pH 8.0 5.0 - 8.0   Glucose, UA >=500 (A) NEGATIVE mg/dL   Hgb urine dipstick NEGATIVE NEGATIVE   Bilirubin Urine NEGATIVE NEGATIVE   Ketones, ur NEGATIVE NEGATIVE mg/dL   Protein, ur 30 (A) NEGATIVE mg/dL   Nitrite NEGATIVE NEGATIVE   Leukocytes, UA TRACE (A) NEGATIVE   RBC / HPF 0-5 0 - 5 RBC/hpf    WBC, UA 6-10 0 - 5 WBC/hpf   Bacteria, UA RARE (A) NONE SEEN   Squamous Epithelial / LPF 6-10 0 - 5   Mucus PRESENT     Comment: Performed at [facility], [place] [address], [place], [place] [address]  Lipase, blood     Status: Abnormal   Collection Time: 12/06/17  3:25 AM  Result Value Ref Range   Lipase 266 (H) 11 - 51 U/L    Comment: Performed at [facility], [place] [address] [place], [place] [address]  Comprehensive metabolic panel     Status: Abnormal   Collection Time: 12/06/17  3:25 AM  Result Value Ref Range   Sodium 141 135 - 145 mmol/L   Potassium 3.4 (L) 3.5 - 5.1 mmol/L   Chloride 106 98 - 111 mmol/L   CO2 27 22 - 32 mmol/L   Glucose, Bld 190 (H) 70 - 99 mg/dL   BUN 9 8 - 23 mg/dL   Creatinine, Ser 0.95 0.44 - 1.00 mg/dL   Calcium 9.2 8.9 - 10.3 mg/dL   Total Protein 8.2 (H) 6.5 - 8.1 g/dL   Albumin 3.8 3.5 - 5.0 g/dL   AST 98 (H) 15 - 41 U/L   ALT 67 (H) 0 - 44 U/L   Alkaline Phosphatase 183 (H) 38 - 126 U/L   Total Bilirubin 0.8 0.3 - 1.2 mg/dL   GFR calc non Af Amer 59 (L) >60 mL/min   GFR calc Af Amer >60 >60 mL/min    Comment: (NOTE) The eGFR has been calculated using the CKD EPI equation. This calculation has not been validated in all clinical situations. eGFR's persistently <60 mL/min signify possible Chronic Kidney Disease.    Anion gap 8 5 - 15    Comment: Performed at [facility], [place] [address], [place], [place] [address]  CBC     Status: None   Collection Time: 12/06/17  3:25 AM  Result Value Ref Range   WBC 6.8 4.0 - 10.5 K/uL   RBC 4.68 3.87 - 5.11 MIL/uL   Hemoglobin 13.3 12.0 - 15.0 g/dL   HCT 43.1 36.0 - 46.0 %   MCV 92.1 80.0 - 100.0 fL   MCH 28.4 26.0 - 34.0 pg   MCHC 30.9 30.0 - 36.0 g/dL   RDW 13.6 11.5 - 15.5 %   Platelets 250 150 - 400 K/uL   nRBC 0.0 0.0 - 0.2 %    Comment: Performed at [facility], [place] [address] [place], [place] [address]  Glucose,  capillary     Status: Abnormal   Collection Time: 12/06/17  8:46 AM  Result Value Ref Range   Glucose-Capillary 116 (H) 70 - 99 mg/dL  Hepatic function panel     Status: Abnormal   Collection Time: 12/06/17  8:47 AM  Result Value Ref Range   Total Protein 7.4 6.5 - 8.1 g/dL   Albumin 3.3 (L) 3.5 -  5.0 g/dL   AST 168 (H) 15 - 41 U/L   ALT 97 (H) 0 - 44 U/L   Alkaline Phosphatase 189 (H) 38 - 126 U/L   Total Bilirubin 1.4 (H) 0.3 - 1.2 mg/dL   Bilirubin, Direct 0.7 (H) 0.0 - 0.2 mg/dL   Indirect Bilirubin 0.7 0.3 - 0.9 mg/dL    Comment: Performed at [facility], [place] [address] [place], [place] [address]  Basic metabolic panel     Status: Abnormal   Collection Time: 12/06/17  8:47 AM  Result Value Ref Range   Sodium 142 135 - 145 mmol/L   Potassium 3.9 3.5 - 5.1 mmol/L   Chloride 106 98 - 111 mmol/L   CO2 28 22 - 32 mmol/L   Glucose, Bld 138 (H) 70 - 99 mg/dL   BUN 7 (L) 8 - 23 mg/dL   Creatinine, Ser 0.88 0.44 - 1.00 mg/dL   Calcium 9.1 8.9 - 10.3 mg/dL   GFR calc non Af Amer >60 >60 mL/min   GFR calc Af Amer >60 >60 mL/min    Comment: (NOTE) The eGFR has been calculated using the CKD EPI equation. This calculation has not been validated in all clinical situations. eGFR's persistently <60 mL/min signify possible Chronic Kidney Disease.    Anion gap 8 5 - 15    Comment: Performed at [facility], [place] [address] [place], [place] [address]  Magnesium     Status: None   Collection Time: 12/06/17  8:47 AM  Result Value Ref Range   Magnesium 1.7 1.7 - 2.4 mg/dL    Comment: Performed at [facility], [place] [address] [place], [place] [address]  CBC WITH DIFFERENTIAL     Status: Abnormal   Collection Time: 12/06/17  8:47 AM  Result Value Ref Range   WBC 12.9 (H) 4.0 - 10.5 K/uL   RBC 4.40 3.87 - 5.11 MIL/uL   Hemoglobin 12.6 12.0 - 15.0 g/dL   HCT 40.3 36.0 - 46.0 %   MCV 91.6 80.0 - 100.0 fL   MCH 28.6 26.0 - 34.0 pg    MCHC 31.3 30.0 - 36.0 g/dL   RDW 13.5 11.5 - 15.5 %   Platelets 263 150 - 400 K/uL   nRBC 0.0 0.0 - 0.2 %   Neutrophils Relative % 85 %   Neutro Abs 11.0 (H) 1.7 - 7.7 K/uL   Lymphocytes Relative 6 %   Lymphs Abs 0.7 0.7 - 4.0 K/uL   Monocytes Relative 8 %   Monocytes Absolute 1.1 (H) 0.1 - 1.0 K/uL   Eosinophils Relative 0 %   Eosinophils Absolute 0.0 0.0 - 0.5 K/uL   Basophils Relative 0 %   Basophils Absolute 0.0 0.0 - 0.1 K/uL   Immature Granulocytes 1 %   Abs Immature Granulocytes 0.06 0.00 - 0.07 K/uL    Comment: Performed at [facility], [place] [address], [place], [place] [address]  Troponin I     Status: Abnormal   Collection Time: 12/06/17  8:47 AM  Result Value Ref Range   Troponin I 0.03 (HH) <0.03 ng/mL    Comment: CRITICAL RESULT CALLED TO, READ BACK BY AND VERIFIED WITH: Georgia Duff., RN AT 0930 12/06/17 CRUICKSHANK A. Performed at [facility], [place] [address], [place], [place] [address]    Mr Abdomen Mrcp Wo Contrast  Result Date: 12/06/2017 CLINICAL DATA:  Vomiting, flank pain and gallstones on ultrasound examination. EXAM: MRI ABDOMEN WITHOUT  CONTRAST  (INCLUDING MRCP) TECHNIQUE: Multiplanar multisequence MR imaging of the abdomen was performed. Heavily T2-weighted images of the biliary and pancreatic ducts were obtained, and three-dimensional MRCP images were rendered by post processing. COMPARISON:  Abdominal ultrasound 12/06/2017 FINDINGS: Lower chest: The lung bases are grossly clear. No infiltrates or large effusions. No pericardial effusion. Hepatobiliary: No focal hepatic lesions are identified. There is mild central intrahepatic biliary dilatation. The gallbladder is filled with gallstones and demonstrates wall thickening and inflammation suggesting acute cholecystitis. Moderate dilatation of the common bile duct measuring up to 11 mm. Low insertion of the cystic duct. Abrupt caliber change without taper in the distal common bile  duct. Suspect small common bile duct stones dependently. No obvious large impacted distal common bile duct stone. Pancreas:  No mass, inflammation or ductal dilatation. Spleen:  Normal size.  No focal lesions. Adrenals/Urinary Tract: The adrenal glands and kidneys are unremarkable. Stomach/Bowel: Visualized portions within the abdomen are unremarkable. Vascular/Lymphatic: No pathologically enlarged lymph nodes identified. No abdominal aortic aneurysm demonstrated. Other:  No ascites or abdominal wall hernia. Musculoskeletal: No significant bony findings. IMPRESSION: 1. Cholelithiasis and findings suggesting acute cholecystitis. There are small stones in the distal third of the common bile duct but no definite large impacted calculus. 2. Mild intra and extrahepatic biliary dilatation. No worrisome hepatic lesions. 3. Normal MR appearance of the pancreas without contrast. No evidence for acute pancreatitis or mass. Electronically Signed   By: Marijo Sanes M.D.   On: 12/06/2017 08:40   Mr 3d Recon At Scanner  Result Date: 12/06/2017 CLINICAL DATA:  Vomiting, flank pain and gallstones on ultrasound examination. EXAM: MRI ABDOMEN WITHOUT CONTRAST  (INCLUDING MRCP) TECHNIQUE: Multiplanar multisequence MR imaging of the abdomen was performed. Heavily T2-weighted images of the biliary and pancreatic ducts were obtained, and three-dimensional MRCP images were rendered by post processing. COMPARISON:  Abdominal ultrasound 12/06/2017 FINDINGS: Lower chest: The lung bases are grossly clear. No infiltrates or large effusions. No pericardial effusion. Hepatobiliary: No focal hepatic lesions are identified. There is mild central intrahepatic biliary dilatation. The gallbladder is filled with gallstones and demonstrates wall thickening and inflammation suggesting acute cholecystitis. Moderate dilatation of the common bile duct measuring up to 11 mm. Low insertion of the cystic duct. Abrupt caliber change without taper in the  distal common bile duct. Suspect small common bile duct stones dependently. No obvious large impacted distal common bile duct stone. Pancreas:  No mass, inflammation or ductal dilatation. Spleen:  Normal size.  No focal lesions. Adrenals/Urinary Tract: The adrenal glands and kidneys are unremarkable. Stomach/Bowel: Visualized portions within the abdomen are unremarkable. Vascular/Lymphatic: No pathologically enlarged lymph nodes identified. No abdominal aortic aneurysm demonstrated. Other:  No ascites or abdominal wall hernia. Musculoskeletal: No significant bony findings. IMPRESSION: 1. Cholelithiasis and findings suggesting acute cholecystitis. There are small stones in the distal third of the common bile duct but no definite large impacted calculus. 2. Mild intra and extrahepatic biliary dilatation. No worrisome hepatic lesions. 3. Normal MR appearance of the pancreas without contrast. No evidence for acute pancreatitis or mass. Electronically Signed   By: Marijo Sanes M.D.   On: 12/06/2017 08:40   US Abdomen Limited  Result Date: 12/06/2017 CLINICAL DATA:  Right upper quadrant pain. Nausea and vomiting today. Elevated lipase and transaminases EXAM: ULTRASOUND ABDOMEN LIMITED RIGHT UPPER QUADRANT COMPARISON:  None. FINDINGS: Gallbladder: Cholelithiasis with multiple stones in the gallbladder measuring up to about 9 mm diameter. No gallbladder wall thickening. Murphy's sign is positive. Common bile  duct: Diameter: 7 mm, upper limits of normal. Liver: Diffusely increased parenchymal echotexture likely representing fatty infiltration. No focal lesions identified. Portal vein is patent on color Doppler imaging with normal direction of blood flow towards the liver. IMPRESSION: Cholelithiasis with positive Murphy's sign. These findings suggest acute cholecystitis in the appropriate clinical setting. Electronically Signed   By: Lucienne Capers M.D.   On: 12/06/2017 05:38    Assessment/Plan Hypertension Bronchitis Asthma Gallstone pancreatitis Acute cholecystitis - Afebrile, vitals stable, WBC increased to 12.9 from 6.8, - lipase 266  AST 168  ALT 97  alk phos 189  total bilirubin 1.4 , LFTs are trending up - MRCP performed shows small stones in the common bile duct and inflammatory changes around the gallbladder.  - Recommend GI consultation for possible ERCP, repeat lipase in the morning - Discussed laparoscopic cholecystectomy as the recommended treatment for gallstone pancreatitis with acute cholecystitis and the patient denies surgery at this time. Educated the patient that cholecystitis may become worse despite antibiotic treatment and make her more ill. Also discussed the possibility of recurrent gallstone pancreatitis. CCS will continue to follow for now.   - continue IV abx and bowel rest  FEN: NPO, hypokalemia ID: Zosyn VTE: SCD's, chemical VTE held for possible procedure   Jill Alexanders, [facility] 12/06/2017, 9:59 AM Pager: [phone] Consults: [phone]

## 2017-12-06 NOTE — ED Notes (Signed)
Pt returned from MRI. Will now be transported to 5W.

## 2017-12-06 NOTE — ED Notes (Signed)
Patient urinated on herself. Patient cleaned up and bed changed.

## 2017-12-06 NOTE — ED Notes (Signed)
ED TO INPATIENT HANDOFF REPORT  Name/Age/Gender Gabriella Butler 72 y.o. female  Code Status   Home/SNF/Other Home  Chief Complaint Abdominal Pain; Back Pain  Level of Care/Admitting Diagnosis ED Disposition    ED Disposition Condition Comment   Admit  Hospital Area: [facility] [[id number]]  Level of Care: Med-Surg [16]  Diagnosis: Acute pancreatitis [577.0.ICD-9-CM]  Admitting Physician: Rise Patience [[id number]]  Attending Physician: Rise Patience [phone]  Estimated length of stay: past midnight tomorrow  Certification:: I certify this patient will need inpatient services for at least 2 midnights  PT Class (Do Not Modify): Inpatient [101]  PT Acc Code (Do Not Modify): Private [1]       Medical History Past Medical History:  Diagnosis Date  . Asthma   . Bronchitis   . Gout   . Hypertension     Allergies No Known Allergies  IV Location/Drains/Wounds Patient Lines/Drains/Airways Status   Active Line/Drains/Airways    Name:   Placement date:   Placement time:   Site:   Days:   Peripheral IV 12/06/17 Left Antecubital   12/06/17    0322    Antecubital   less than 1          Labs/Imaging Results for orders placed or performed during the hospital encounter of 12/06/17 (from the past 48 hour(s))  Urinalysis, Routine w reflex microscopic- may I&O cath if menses     Status: Abnormal   Collection Time: 12/06/17  3:07 AM  Result Value Ref Range   Color, Urine YELLOW YELLOW   APPearance HAZY (A) CLEAR   Specific Gravity, Urine 1.013 1.005 - 1.030   pH 8.0 5.0 - 8.0   Glucose, UA >=500 (A) NEGATIVE mg/dL   Hgb urine dipstick NEGATIVE NEGATIVE   Bilirubin Urine NEGATIVE NEGATIVE   Ketones, ur NEGATIVE NEGATIVE mg/dL   Protein, ur 30 (A) NEGATIVE mg/dL   Nitrite NEGATIVE NEGATIVE   Leukocytes, UA TRACE (A) NEGATIVE   RBC / HPF 0-5 0 - 5 RBC/hpf   WBC, UA 6-10 0 - 5 WBC/hpf   Bacteria, UA RARE (A) NONE SEEN   Squamous Epithelial / LPF 6-10  0 - 5   Mucus PRESENT     Comment: Performed at [facility], [place] [address], [place], [place] [address]  Lipase, blood     Status: Abnormal   Collection Time: 12/06/17  3:25 AM  Result Value Ref Range   Lipase 266 (H) 11 - 51 U/L    Comment: Performed at [facility], [place] [address], [place], [place] [address]  Comprehensive metabolic panel     Status: Abnormal   Collection Time: 12/06/17  3:25 AM  Result Value Ref Range   Sodium 141 135 - 145 mmol/L   Potassium 3.4 (L) 3.5 - 5.1 mmol/L   Chloride 106 98 - 111 mmol/L   CO2 27 22 - 32 mmol/L   Glucose, Bld 190 (H) 70 - 99 mg/dL   BUN 9 8 - 23 mg/dL   Creatinine, Ser 0.95 0.44 - 1.00 mg/dL   Calcium 9.2 8.9 - 10.3 mg/dL   Total Protein 8.2 (H) 6.5 - 8.1 g/dL   Albumin 3.8 3.5 - 5.0 g/dL   AST 98 (H) 15 - 41 U/L   ALT 67 (H) 0 - 44 U/L   Alkaline Phosphatase 183 (H) 38 - 126 U/L   Total Bilirubin 0.8 0.3 - 1.2 mg/dL   GFR calc non Af Amer 59 (L) >  60 mL/min   GFR calc Af Amer >60 >60 mL/min    Comment: (NOTE) The eGFR has been calculated using the CKD EPI equation. This calculation has not been validated in all clinical situations. eGFR's persistently <60 mL/min signify possible Chronic Kidney Disease.    Anion gap 8 5 - 15    Comment: Performed at [facility], [place] [address], [place], [place] [address]  CBC     Status: None   Collection Time: 12/06/17  3:25 AM  Result Value Ref Range   WBC 6.8 4.0 - 10.5 K/uL   RBC 4.68 3.87 - 5.11 MIL/uL   Hemoglobin 13.3 12.0 - 15.0 g/dL   HCT 43.1 36.0 - 46.0 %   MCV 92.1 80.0 - 100.0 fL   MCH 28.4 26.0 - 34.0 pg   MCHC 30.9 30.0 - 36.0 g/dL   RDW 13.6 11.5 - 15.5 %   Platelets 250 150 - 400 K/uL   nRBC 0.0 0.0 - 0.2 %    Comment: Performed at [facility], [place] [address], [place], [place] [address]   US Abdomen Limited  Result Date: 12/06/2017 CLINICAL DATA:  Right upper quadrant pain. Nausea and  vomiting today. Elevated lipase and transaminases EXAM: ULTRASOUND ABDOMEN LIMITED RIGHT UPPER QUADRANT COMPARISON:  None. FINDINGS: Gallbladder: Cholelithiasis with multiple stones in the gallbladder measuring up to about 9 mm diameter. No gallbladder wall thickening. Murphy's sign is positive. Common bile duct: Diameter: 7 mm, upper limits of normal. Liver: Diffusely increased parenchymal echotexture likely representing fatty infiltration. No focal lesions identified. Portal vein is patent on color Doppler imaging with normal direction of blood flow towards the liver. IMPRESSION: Cholelithiasis with positive Murphy's sign. These findings suggest acute cholecystitis in the appropriate clinical setting. Electronically Signed   By: Lucienne Capers M.D.   On: 12/06/2017 05:38    Pending Labs Unresulted Labs (From admission, onward)   None      Vitals/Pain Today's Vitals   12/06/17 0430 12/06/17 0500 12/06/17 0530 12/06/17 0610  BP: (!) 178/80 (!) 172/90 (!) 186/98   Pulse: 74 76 79   Resp: (!) 24 (!) 27 (!) 21   Temp:      TempSrc:      SpO2: 97% 97% 100%   Weight:      Height:      PainSc:    7     Isolation Precautions No active isolations  Medications Medications  ondansetron (ZOFRAN) injection 4 mg (4 mg Intravenous Given 12/06/17 0419)  sodium chloride 0.9 % bolus 1,000 mL (0 mLs Intravenous Stopped 12/06/17 0551)  morphine 4 MG/ML injection 4 mg (4 mg Intravenous Given 12/06/17 0500)  ondansetron (ZOFRAN) injection 4 mg (4 mg Intravenous Given 12/06/17 0500)  morphine 4 MG/ML injection 4 mg (4 mg Intravenous Given 12/06/17 0610)    Mobility walks with device

## 2017-12-06 NOTE — ED Notes (Signed)
Patient transported to MRI 

## 2017-12-06 NOTE — ED Notes (Signed)
Report given to floor RN. Pt will be transported to floor once she returns from MRI.

## 2017-12-06 NOTE — Progress Notes (Signed)
The Patient was admitted early this morning after midnight and H&P are reviewed and I am in current agreement with current assessment and plan done by Dr. Gean Birchwood. Additional changes to the plan of care have been made accordingly.  Patient is an obese [place] American female with a past medical history significant for asthma with bronchitis, history of gout, hypertension, and other comorbidities who presented to the ED with a chief complaint of worsening abdominal pain.  Patient reported having abdominal pain for last few days which was increased after eating and a subjective feeling of fever and chills, and last night the pain worsened and radiated to her back.  She had multiple episodes of vomiting and described the pain in the abdomen mostly in epigastric and right upper quadrant.  Labs revealed elevated LFTs along with elevated lipase concerning for gallstone pancreatitis.  General surgery was consulted and recommended hospitalist admission and symptomatic management with fluids and pain control/patient was placed on empiric antibiotics and an MRCP was checked for common bile duct stone.  MRCP revealed cholelithiasis and findings suggestive of acute cholecystitis.  There is also small stones in the distal third of the common bile duct but no definitive large impacted calculus and there is also mild intra-and extrahepatic biliary dilatation.  Gastroenterology was also consulted for further evaluation recommendations given her ductal dilatation and choledocholithiasis.  We will continue bowel rest and antibiotics.  Await further evaluation by GI and general surgery.  Timing of surgery are general surgery.  We will continue monitor patient's clinical response to intervention and repeat blood work in a.m.

## 2017-12-06 NOTE — Progress Notes (Signed)
Pharmacy Antibiotic Note  Grisela Mesch is a 72 y.o. female admitted on 12/06/2017 with Intra-abdominal infection.  Pharmacy has been consulted for zosyn dosing.  Plan: Zosyn 3.375g IV q8h (4 hour infusion).  Height: 5\' 6"  (167.6 cm) Weight: 200 lb (90.7 kg) IBW/kg (Calculated) : 59.3  Temp (24hrs), Avg:97.5 F (36.4 C), Min:97.5 F (36.4 C), Max:97.5 F (36.4 C)  Recent Labs  Lab 12/06/17 0325  WBC 6.8  CREATININE 0.95    Estimated Creatinine Clearance: 61.7 mL/min (by C-G formula based on SCr of 0.95 mg/dL).    No Known Allergies  Antimicrobials this admission: Zosyn 12/06/2017 >>   Dose adjustments this admission: -  Microbiology results: -  Thank you for allowing pharmacy to be a part of this patient's care.  Nani Skillern Crowford 12/06/2017 6:39 AM

## 2017-12-06 NOTE — ED Notes (Signed)
Report to [employer]

## 2017-12-06 NOTE — Plan of Care (Signed)
Patient received from ED; a&o, no complaints of pain needing intervention at this time. Will continue to monitor.

## 2017-12-06 NOTE — Progress Notes (Signed)
PHARMACY NOTE -  [facility] has been assisting with dosing of Zosyn for gallstone pancreatitis.  Dosage remains stable at 3.375 g IV q8 hr and need for further dosage adjustment appears unlikely at present given stable renal function   Pharmacy will sign off, following peripherally for culture results or dose adjustments. Please reconsult if a change in clinical status warrants re-evaluation of dosage.  Reuel Boom, PharmD, BCPS [phone] 12/06/2017, 1:47 PM

## 2017-12-06 NOTE — ED Triage Notes (Signed)
Pt c/o vomiting off and on x 2-3 days, flank pain began before MN, and upper abd pain x 2-3 days.

## 2017-12-06 NOTE — Consult Note (Signed)
Consultation  Referring Provider:  [facility] Alfredia Ferguson DO Primary Care Physician:  Patient, No Pcp Per Primary Gastroenterologist:  none.  Reason for Consultation:  Gallstone pancreatitis  HPI: Gabriella Butler is a 72 y.o. female, who was admitted earlier today through the emergency room after acute onset of severe epigastric pain radiating to the back and associated with nausea and vomiting.  She had been having abdominal pain over the past 2 days which worsened last evening.  No fever or chills at home. Patient has not had any prior similar episodes. Evaluation in the emergency room upper abdominal ultrasound showing fatty liver, multiple gallstones, no gallbladder wall thickening and a bile duct of 7 mm. Initial labs with lipase 266, WBC 6.8, hemoglobin 13.3, T bili 0.8, alk phos 183, ALT 67 AST of 98  MRI/MRCP this morning urgently shows no evidence of acute pancreatitis or Gallbladder is filled with gallstones and wall is thickened and inflamed suggestive of acute cholecystitis.  CBD is 59m there is a low insertion of the cystic duct and suspicion of small common bile duct stones dependently in the distal duct.  No obvious large impacted distal CBD stone.  Second set of liver tests with T bili 1.4 alk phos 189 ALT 97  AST 168  Patient is a little sleepy secondary to pain meds, her daughter is present in the room.  Patient says she feels a little bit better and is no longer nauseated.  She continues to have pain in the epigastrium but says the pain medication is helpful.  Other medical problems include COPD, asthma, and hypertension.  She tells me that she has not had prior GI evaluation.   Past Medical History:  Diagnosis Date  . Asthma   . Bronchitis   . Gout   . Hypertension     History reviewed. No pertinent surgical history.  Prior to Admission medications   Medication Sig Start Date End Date Taking? Authorizing Provider  albuterol (PROVENTIL HFA;VENTOLIN HFA)  108 (90 Base) MCG/ACT inhaler Inhale 1-2 puffs into the lungs every 6 (six) hours as needed for wheezing or shortness of breath. 02/04/17  Yes KDorie Rank MD  beclomethasone (QVAR) 40 MCG/ACT inhaler Inhale 1 puff into the lungs 2 (two) times daily. 02/04/17  Yes KDorie Rank MD  Cetirizine HCl 10 MG CAPS Take 1 capsule (10 mg total) by mouth daily for 10 days. Patient not taking: Reported on 12/06/2017 07/12/17 07/22/17  Wieters, Hallie C, PA-C  traMADol (ULTRAM) 50 MG tablet Take 1 tablet (50 mg total) by mouth every 6 (six) hours as needed. Patient not taking: Reported on 12/06/2017 02/04/17   KDorie Rank MD    Current Facility-Administered Medications  Medication Dose Route Frequency Provider Last Rate Last Dose  . acetaminophen (TYLENOL) tablet 650 mg  650 mg Oral Q6H PRN KRise Patience MD       Or  . acetaminophen (TYLENOL) suppository 650 mg  650 mg Rectal Q6H PRN KRise Patience MD      . albuterol (PROVENTIL) (2.5 MG/3ML) 0.083% nebulizer solution 2.5 mg  2.5 mg Nebulization Q2H PRN KRise Patience MD      . budesonide (PULMICORT) nebulizer solution 0.25 mg  0.25 mg Nebulization BID KRise Patience MD      . insulin aspart (novoLOG) injection 0-9 Units  0-9 Units Subcutaneous Q4H KRise Patience MD      . lactated ringers infusion   Intravenous Continuous KRise Patience MD      .  morphine 2 MG/ML injection 1 mg  1 mg Intravenous Q2H PRN Rise Patience, MD      . ondansetron [facility]) tablet 4 mg  4 mg Oral Q6H PRN Rise Patience, MD       Or  . ondansetron [facility]) injection 4 mg  4 mg Intravenous Q6H PRN Rise Patience, MD      . piperacillin-tazobactam (ZOSYN) IVPB 3.375 g  3.375 g Intravenous Q8H Gean Birchwood N, MD      . potassium chloride 10 mEq in 100 mL IVPB  10 mEq Intravenous Q1 Hr x 4 Sheikh, [place], DO        Allergies as of 12/06/2017  . (No Known Allergies)    Family History  Problem Relation Age of Onset  .  Hypertension Father     Social History   Socioeconomic History  . Marital status: Single    Spouse name: Not on file  . Number of children: Not on file  . Years of education: Not on file  . Highest education level: Not on file  Occupational History  . Not on file  Social Needs  . Financial resource strain: Not on file  . Food insecurity:    Worry: Not on file    Inability: Not on file  . Transportation needs:    Medical: Not on file    Non-medical: Not on file  Tobacco Use  . Smoking status: Never Smoker  . Smokeless tobacco: Never Used  Substance and Sexual Activity  . Alcohol use: No  . Drug use: No  . Sexual activity: Not on file  Lifestyle  . Physical activity:    Days per week: Not on file    Minutes per session: Not on file  . Stress: Not on file  Relationships  . Social connections:    Talks on phone: Not on file    Gets together: Not on file    Attends religious service: Not on file    Active member of club or organization: Not on file    Attends meetings of clubs or organizations: Not on file    Relationship status: Not on file  . Intimate partner violence:    Fear of current or ex partner: Not on file    Emotionally abused: Not on file    Physically abused: Not on file    Forced sexual activity: Not on file  Other Topics Concern  . Not on file  Social History Narrative  . Not on file    Review of Systems: Pertinent positive and negative review of systems were noted in the above HPI section.  All other review of systems was otherwise negative.   Physical Exam: Vital signs in last 24 hours: Temp:  [97.5 F (36.4 C)-99.1 F (37.3 C)] 99.1 F (37.3 C) (11/06 0825) Pulse Rate:  [63-79] 79 (11/06 0825) Resp:  [8-28] 18 (11/06 0825) BP: (129-186)/(75-98) 129/75 (11/06 0825) SpO2:  [97 %-100 %] 100 % (11/06 0825) Weight:  [90.7 kg] 90.7 kg (11/06 0252) Last BM Date: 12/01/17 General:   Alert,  Well-developed, older African-American female  well-nourished, pleasant and cooperative in NAD Head:  Normocephalic and atraumatic. Eyes:  Sclera clear, no icterus.   Conjunctiva pink. Ears:  Normal auditory acuity. Nose:  No deformity, discharge,  or lesions. Mouth:  No deformity or lesions.   Neck:  Supple; no masses or thyromegaly. Lungs:  Clear throughout to auscultation.   No wheezes, crackles, or rhonchi. Heart:  Regular  rate and rhythm; no murmurs, clicks, rubs,  or gallops. Abdomen:  Soft,tender across upper abdomen, no rebound, no fluid wave, BS active,nonpalp mass or hsm.   Rectal:  Deferred  Msk:  Symmetrical without gross deformities. . Pulses:  Normal pulses noted. Extremities:  Without clubbing or edema. Neurologic:  Alert and  oriented x4;  grossly normal neurologically.  Bit sleepy Skin:  Intact without significant lesions or rashes.. Psych:  Alert and cooperative. Normal mood and affect.  Intake/Output from previous day: 11/05 0701 - 11/06 0700 In: 1000 [IV Piggyback:1000] Out: -  Intake/Output this shift: No intake/output data recorded.  Lab Results: Recent Labs    12/06/17 0325 12/06/17 0847  WBC 6.8 12.9*  HGB 13.3 12.6  HCT 43.1 40.3  PLT 250 263   BMET Recent Labs    12/06/17 0325 12/06/17 0847  NA 141 142  K 3.4* 3.9  CL 106 106  CO2 27 28  GLUCOSE 190* 138*  BUN 9 7*  CREATININE 0.95 0.88  CALCIUM 9.2 9.1   LFT Recent Labs    12/06/17 0847  PROT 7.4  ALBUMIN 3.3*  AST 168*  ALT 97*  ALKPHOS 189*  BILITOT 1.4*  BILIDIR 0.7*  IBILI 0.7   PT/INR No results for input(s): LABPROT, INR in the last 72 hours. Hepatitis Panel No results for input(s): HEPBSAG, HCVAB, HEPAIGM, HEPBIGM in the last 72 hours.   IMPRESSION:   #38 72 year old African-American female who admitted earlier today through the emergency room with 2-day history of acute epigastric pain worsened last evening with radiation into the back and associated with nausea and vomiting.  Work-up is consistent with  multiple gallstones and acute cholecystitis, no active pancreatitis by MRI though she did have mild increase in lipase. CBD is dilated there is suggestion of small distal CBD stones.  #2 history of hypertension #3.  Asthma and history of bronchitis   Plan; keep patient n.p.o. except ice chips today IV Zosyn has been started, continue  Patient will need ERCP and probable sphincterotomy with stone extraction.  Procedure was discussed in detail with the patient and her daughter, including indications risks and benefits.  She should also have cholecystectomy this admission, and surgery is following.  We have tentatively reserved her a procedure block for tomorrow morning with Dr. Ardis Hughs.  Unfortunately after our conversation she says that she does not want to undergo either of these procedures/surgery.  She is willing to think about it today, talk with her family.  I encouraged them to write down a list of questions.  I explained that it is possible that she may get through this current episode , but will be very high risk for recurrent cholecystitis and choledocholithiasis with possibility of cholangitis  and severe pancreatitis etc.      12/06/2017, 11:36 AM

## 2017-12-07 ENCOUNTER — Encounter (HOSPITAL_COMMUNITY)
Admission: EM | Disposition: A | Discharge status: Home / Self Care | Payer: Self-pay | Source: Home / Self Care | Attending: Internal Medicine

## 2017-12-07 DIAGNOSIS — R933 Abnormal findings on diagnostic imaging of other parts of digestive tract: Secondary | ICD-10-CM | POA: Diagnosis not present

## 2017-12-07 DIAGNOSIS — K8050 Calculus of bile duct without cholangitis or cholecystitis without obstruction: Secondary | ICD-10-CM

## 2017-12-07 DIAGNOSIS — N179 Acute kidney failure, unspecified: Secondary | ICD-10-CM | POA: Diagnosis not present

## 2017-12-07 DIAGNOSIS — R1013 Epigastric pain: Secondary | ICD-10-CM | POA: Diagnosis not present

## 2017-12-07 DIAGNOSIS — R7303 Prediabetes: Secondary | ICD-10-CM | POA: Diagnosis not present

## 2017-12-07 DIAGNOSIS — K851 Biliary acute pancreatitis without necrosis or infection: Secondary | ICD-10-CM | POA: Diagnosis not present

## 2017-12-07 DIAGNOSIS — K8062 Calculus of gallbladder and bile duct with acute cholecystitis without obstruction: Secondary | ICD-10-CM | POA: Diagnosis not present

## 2017-12-07 DIAGNOSIS — R7309 Other abnormal glucose: Secondary | ICD-10-CM | POA: Diagnosis not present

## 2017-12-07 DIAGNOSIS — I10 Essential (primary) hypertension: Secondary | ICD-10-CM | POA: Diagnosis not present

## 2017-12-07 DIAGNOSIS — K810 Acute cholecystitis: Secondary | ICD-10-CM | POA: Diagnosis not present

## 2017-12-07 DIAGNOSIS — J45909 Unspecified asthma, uncomplicated: Secondary | ICD-10-CM | POA: Diagnosis not present

## 2017-12-07 DIAGNOSIS — E876 Hypokalemia: Secondary | ICD-10-CM | POA: Diagnosis not present

## 2017-12-07 LAB — COMPREHENSIVE METABOLIC PANEL
ALBUMIN: 2.9 g/dL — AB (ref 3.5–5.0)
ALK PHOS: 200 U/L — AB (ref 38–126)
ALT: 94 U/L — AB (ref 0–44)
ANION GAP: 6 (ref 5–15)
AST: 108 U/L — ABNORMAL HIGH (ref 15–41)
BUN: 7 mg/dL — ABNORMAL LOW (ref 8–23)
CALCIUM: 8.9 mg/dL (ref 8.9–10.3)
CO2: 29 mmol/L (ref 22–32)
Chloride: 106 mmol/L (ref 98–111)
Creatinine, Ser: 1.18 mg/dL — ABNORMAL HIGH (ref 0.44–1.00)
GFR calc Af Amer: 52 mL/min — ABNORMAL LOW (ref >60)
GFR calc non Af Amer: 45 mL/min — ABNORMAL LOW (ref >60)
GLUCOSE: 123 mg/dL — AB (ref 70–99)
Potassium: 3.5 mmol/L (ref 3.5–5.1)
Sodium: 141 mmol/L (ref 135–145)
Total Bilirubin: 1.9 mg/dL — ABNORMAL HIGH (ref 0.3–1.2)
Total Protein: 6.7 g/dL (ref 6.5–8.1)

## 2017-12-07 LAB — GLUCOSE, CAPILLARY
GLUCOSE-CAPILLARY: 114 mg/dL — AB (ref 70–99)
GLUCOSE-CAPILLARY: 113 mg/dL — AB (ref 70–99)
Glucose-Capillary: 103 mg/dL — ABNORMAL HIGH (ref 70–99)
Glucose-Capillary: 88 mg/dL (ref 70–99)

## 2017-12-07 LAB — CBC WITH DIFFERENTIAL/PLATELET
Abs Immature Granulocytes: 0.03 10*3/uL (ref 0.00–0.07)
BASOS ABS: 0 10*3/uL (ref 0.0–0.1)
Basophils Relative: 0 %
Eosinophils Absolute: 0.1 10*3/uL (ref 0.0–0.5)
Eosinophils Relative: 2 %
HCT: 38.3 % (ref 36.0–46.0)
HEMOGLOBIN: 11.9 g/dL — AB (ref 12.0–15.0)
Immature Granulocytes: 0 %
LYMPHS PCT: 27 %
Lymphs Abs: 2 10*3/uL (ref 0.7–4.0)
MCH: 28.7 pg (ref 26.0–34.0)
MCHC: 31.1 g/dL (ref 30.0–36.0)
MCV: 92.3 fL (ref 80.0–100.0)
Monocytes Absolute: 0.8 10*3/uL (ref 0.1–1.0)
Monocytes Relative: 11 %
Neutro Abs: 4.3 10*3/uL (ref 1.7–7.7)
Neutrophils Relative %: 60 %
Platelets: 245 10*3/uL (ref 150–400)
RBC: 4.15 MIL/uL (ref 3.87–5.11)
RDW: 13.8 % (ref 11.5–15.5)
WBC: 7.3 10*3/uL (ref 4.0–10.5)
nRBC: 0 % (ref 0.0–0.2)

## 2017-12-07 LAB — PHOSPHORUS: Phosphorus: 2.5 mg/dL (ref 2.5–4.6)

## 2017-12-07 LAB — MAGNESIUM: Magnesium: 1.9 mg/dL (ref 1.7–2.4)

## 2017-12-07 LAB — LIPASE, BLOOD: LIPASE: 23 U/L (ref 11–51)

## 2017-12-07 SURGERY — ERCP, WITH INTERVENTION IF INDICATED
Anesthesia: General

## 2017-12-07 MED ORDER — AMOXICILLIN-POT CLAVULANATE 875-125 MG PO TABS: 1.0000 | ORAL_TABLET | Freq: Two times a day (BID) | ORAL | Status: DC

## 2017-12-07 MED ORDER — ONDANSETRON HCL 4 MG PO TABS: 4.0000 mg | ORAL_TABLET | Freq: Four times a day (QID) | ORAL | 0 refills | Status: AC | PRN

## 2017-12-07 MED ORDER — LACTATED RINGERS IV SOLN: INTRAVENOUS | Status: DC

## 2017-12-07 MED ORDER — AMOXICILLIN-POT CLAVULANATE 875-125 MG PO TABS: 1.0000 | ORAL_TABLET | Freq: Two times a day (BID) | ORAL | 0 refills | Status: AC

## 2017-12-07 NOTE — Discharge Summary (Signed)
Physician Discharge Summary  Gabriella Butler VHQ:[id number] DOB: 1945-08-04 DOA: 12/06/2017  PCP: Patient, No Pcp Per  Admit date: 12/06/2017 Discharge date: 12/07/2017  Admitted From: Home Disposition: Home  Recommendations for Outpatient Follow-up:  1. Follow up with PCP in 1-2 weeks 2. Follow up with General Surgery within 1 week  3. Follow up with Gastroenterology as an outpatient  4. Please obtain CMP/CBC, Mag, Phos in one week 5. Please follow up on the following pending results:  Home Health: No Equipment/Devices: None    Discharge Condition: Stable CODE STATUS: FULL CODE Diet recommendation: Heart Healthy Carb Modified Diet   Brief/Interim Summary: Patient is an obese [place] American female with a past medical history significant for asthma with bronchitis, history of gout, hypertension, and other comorbidities who presented to the ED with a chief complaint of worsening abdominal pain.  Patient reported having abdominal pain for last few days which was increased after eating and a subjective feeling of fever and chills, and last night the pain worsened and radiated to her back.  She had multiple episodes of vomiting and described the pain in the abdomen mostly in epigastric and right upper quadrant.  Labs revealed elevated LFTs along with elevated lipase concerning for gallstone pancreatitis.    General surgery was consulted and recommended hospitalist admission and symptomatic management with fluids and pain control/patient was placed on empiric antibiotics and an MRCP was checked for common bile duct stone.  MRCP revealed cholelithiasis and findings suggestive of acute cholecystitis.  There is also small stones in the distal third of the common bile duct but no definitive large impacted calculus and there is also mild intra-and extrahepatic biliary dilatation.    Gastroenterology was also consulted for further evaluation recommendations given her ductal dilatation and  choledocholithiasis they recommended ERCP which the patient refused.  Patient also refused surgical intervention.  Neurosurgery and both GI signed off the case because of her refusal and recommended antibiotic coverage for the gallbladder for 10 to 14 days.  She was deemed stable to discharge home as she tolerated her meal without issues however was told her that she is at high risk for decompensating and getting worse without interventions but she understands these risks and wants to go home so she will be discharged home without surgical intervention or any GI intervention.  Discharge Diagnoses:  Principal Problem:   Acute gallstone pancreatitis Active Problems:   Acute cholecystitis   Essential hypertension   Asthma   Abnormal magnetic resonance cholangiopancreatography (MRCP)   Choledocholithiasis   Elevated glucose level  AKI -Mild renal insufficiency -Patient's BUN/creatinine went from 7/0.88 and is now 7/1.18 -Continue to monitor and encourage oral hydration repeat CMP in outpatient with setting PCP  Pre-Diabetes -Globin A1c was 6.2 -Outpatient follow-up with PCP for further blood sugar management Acute gallstone pancreatitis with acute cholecystitis  -Acute otitis is improved -General surgery consulted for further evaluation recommendations and MRCP did show choledocholithiasis and acute cholecystitis -General surgery recommended GI intervention with ERCP however patient declined ERCP -Patient also declined surgical intervention at this time and wanted conservative measures so general surgery and GI both recommended robotic coverage.  Patient was able to tolerate her food without issues she is deemed medically stable to be discharged and will need a 10-day course of antibiotics and will need close follow-up with GI as well as general surgery -He was hydrated and then taken off of IV fluids and it was recommended to her to do the intervention but she declined; risks  of decompensation  worsening and urine readmission were discussed with the patient and she understands these risks being of sound mind  Hypertension  -has not been taking her medications.  For now kept patient on PRN IV hydralazine.  History of asthma  -presently not wheezing.  Mild hypokalemia -likely from vomiting.  Replace and recheck. -Potassium improved to 3.5 prior to discharge  Abnormal LFTs in the setting of acute cholecystitis -Patient's AST went from 168 and trended down to 108 and ALT went from 97 to down to 94 -SHe does have choledocholithiasis and biliary dilatation which GI ended ERCP for however patient declined  Hyperbilirubinemia -Worsening as bilirubin went from 0.8 and now 1.9 -Patient declined any admissions including ERCP and surgical intervention -Continue monitor trend and follow repeat in outpatient setting  Discharge Instructions Discharge Instructions    Call MD for:  difficulty breathing, headache or visual disturbances   Complete by:  As directed    Call MD for:  extreme fatigue   Complete by:  As directed    Call MD for:  hives   Complete by:  As directed    Call MD for:  persistant dizziness or light-headedness   Complete by:  As directed    Call MD for:  persistant nausea and vomiting   Complete by:  As directed    Call MD for:  redness, tenderness, or signs of infection (pain, swelling, redness, odor or green/yellow discharge around incision site)   Complete by:  As directed    Call MD for:  severe uncontrolled pain   Complete by:  As directed    Call MD for:  temperature >100.4   Complete by:  As directed    Diet - low sodium heart healthy   Complete by:  As directed    Discharge instructions   Complete by:  As directed    You were cared for by a hospitalist during your hospital stay. If you have any questions about your discharge medications or the care you received while you were in the hospital after you are discharged, you can call the unit and ask to  speak with the hospitalist on call if the hospitalist that took care of you is not available. Once you are discharged, your primary care physician will handle any further medical issues. Please note that NO REFILLS for any discharge medications will be authorized once you are discharged, as it is imperative that you return to your primary care physician (or establish a relationship with a primary care physician if you do not have one) for your aftercare needs so that they can reassess your need for medications and monitor your lab values.  Follow up with PCP, General Surgery, and Gastroenterology. Take all medications as prescribed. If symptoms change or worsen please return to the ED for evaluation   Increase activity slowly   Complete by:  As directed      Allergies as of 12/07/2017   No Known Allergies     Medication List    TAKE these medications   albuterol 108 (90 Base) MCG/ACT inhaler Commonly known as:  PROVENTIL HFA;VENTOLIN HFA Inhale 1-2 puffs into the lungs every 6 (six) hours as needed for wheezing or shortness of breath.   amoxicillin-clavulanate 875-125 MG tablet Commonly known as:  AUGMENTIN Take 1 tablet by mouth every 12 (twelve) hours for 13 days.   beclomethasone 40 MCG/ACT inhaler Commonly known as:  QVAR Inhale 1 puff into the lungs 2 (two) times daily.  Cetirizine HCl 10 MG Caps Take 1 capsule (10 mg total) by mouth daily for 10 days.   ondansetron 4 MG tablet Commonly known as:  ZOFRAN Take 1 tablet (4 mg total) by mouth every 6 (six) hours as needed for nausea.   traMADol 50 MG tablet Commonly known as:  ULTRAM Take 1 tablet (50 mg total) by mouth every 6 (six) hours as needed.      Follow-up [facility], [place]. Schedule an appointment as soon as possible for a visit.   Specialty:  General Surgery Why:  to discussed gallbladder surgery. Contact information: [address] [place]  [place] [phone]         No Known Allergies  Consultations: General Surgery Gastroenterology  Procedures/Studies: Mr Abdomen Mrcp Wo Contrast  Result Date: 12/06/2017 CLINICAL DATA:  Vomiting, flank pain and gallstones on ultrasound examination. EXAM: MRI ABDOMEN WITHOUT CONTRAST  (INCLUDING MRCP) TECHNIQUE: Multiplanar multisequence MR imaging of the abdomen was performed. Heavily T2-weighted images of the biliary and pancreatic ducts were obtained, and three-dimensional MRCP images were rendered by post processing. COMPARISON:  Abdominal ultrasound 12/06/2017 FINDINGS: Lower chest: The lung bases are grossly clear. No infiltrates or large effusions. No pericardial effusion. Hepatobiliary: No focal hepatic lesions are identified. There is mild central intrahepatic biliary dilatation. The gallbladder is filled with gallstones and demonstrates wall thickening and inflammation suggesting acute cholecystitis. Moderate dilatation of the common bile duct measuring up to 11 mm. Low insertion of the cystic duct. Abrupt caliber change without taper in the distal common bile duct. Suspect small common bile duct stones dependently. No obvious large impacted distal common bile duct stone. Pancreas:  No mass, inflammation or ductal dilatation. Spleen:  Normal size.  No focal lesions. Adrenals/Urinary Tract: The adrenal glands and kidneys are unremarkable. Stomach/Bowel: Visualized portions within the abdomen are unremarkable. Vascular/Lymphatic: No pathologically enlarged lymph nodes identified. No abdominal aortic aneurysm demonstrated. Other:  No ascites or abdominal wall hernia. Musculoskeletal: No significant bony findings. IMPRESSION: 1. Cholelithiasis and findings suggesting acute cholecystitis. There are small stones in the distal third of the common bile duct but no definite large impacted calculus. 2. Mild intra and extrahepatic biliary dilatation. No worrisome hepatic lesions. 3. Normal MR appearance  of the pancreas without contrast. No evidence for acute pancreatitis or mass. Electronically Signed   By: Marijo Sanes M.D.   On: 12/06/2017 08:40   Mr 3d Recon At Scanner  Result Date: 12/06/2017 CLINICAL DATA:  Vomiting, flank pain and gallstones on ultrasound examination. EXAM: MRI ABDOMEN WITHOUT CONTRAST  (INCLUDING MRCP) TECHNIQUE: Multiplanar multisequence MR imaging of the abdomen was performed. Heavily T2-weighted images of the biliary and pancreatic ducts were obtained, and three-dimensional MRCP images were rendered by post processing. COMPARISON:  Abdominal ultrasound 12/06/2017 FINDINGS: Lower chest: The lung bases are grossly clear. No infiltrates or large effusions. No pericardial effusion. Hepatobiliary: No focal hepatic lesions are identified. There is mild central intrahepatic biliary dilatation. The gallbladder is filled with gallstones and demonstrates wall thickening and inflammation suggesting acute cholecystitis. Moderate dilatation of the common bile duct measuring up to 11 mm. Low insertion of the cystic duct. Abrupt caliber change without taper in the distal common bile duct. Suspect small common bile duct stones dependently. No obvious large impacted distal common bile duct stone. Pancreas:  No mass, inflammation or ductal dilatation. Spleen:  Normal size.  No focal lesions. Adrenals/Urinary Tract: The adrenal glands and kidneys are unremarkable. Stomach/Bowel:  Visualized portions within the abdomen are unremarkable. Vascular/Lymphatic: No pathologically enlarged lymph nodes identified. No abdominal aortic aneurysm demonstrated. Other:  No ascites or abdominal wall hernia. Musculoskeletal: No significant bony findings. IMPRESSION: 1. Cholelithiasis and findings suggesting acute cholecystitis. There are small stones in the distal third of the common bile duct but no definite large impacted calculus. 2. Mild intra and extrahepatic biliary dilatation. No worrisome hepatic lesions. 3.  Normal MR appearance of the pancreas without contrast. No evidence for acute pancreatitis or mass. Electronically Signed   By: Marijo Sanes M.D.   On: 12/06/2017 08:40   US Abdomen Limited  Result Date: 12/06/2017 CLINICAL DATA:  Right upper quadrant pain. Nausea and vomiting today. Elevated lipase and transaminases EXAM: ULTRASOUND ABDOMEN LIMITED RIGHT UPPER QUADRANT COMPARISON:  None. FINDINGS: Gallbladder: Cholelithiasis with multiple stones in the gallbladder measuring up to about 9 mm diameter. No gallbladder wall thickening. Murphy's sign is positive. Common bile duct: Diameter: 7 mm, upper limits of normal. Liver: Diffusely increased parenchymal echotexture likely representing fatty infiltration. No focal lesions identified. Portal vein is patent on color Doppler imaging with normal direction of blood flow towards the liver. IMPRESSION: Cholelithiasis with positive Murphy's sign. These findings suggest acute cholecystitis in the appropriate clinical setting. Electronically Signed   By: Lucienne Capers M.D.   On: 12/06/2017 05:38    Subjective: Seen and examined at bedside and states that she is feeling better with no more nausea vomiting or abdominal pain.  Had a very lengthy discussion the patient about having an ERCP and surgical intervention as this was what was recommended to her at that she may continue get worse and ended up possibly even bacteremic and back in the hospital but she declines these interventions at this time.  She is of sound mind making these decisions and daughter understands well and agrees with patient's mother's decision.  She is to be discharged home on oral antibiotics however risks of worsening including worsening abdominal pain, bacteremia, and even sepsis were explained to the patient and patient understands these risks.  Recommended calling GI and general surgery if she worsens.  Discharge Exam: Vitals:   12/07/17 0500 12/07/17 0850  BP: 122/65   Pulse: 72    Resp: 16   Temp: 98.6 F (37 C)   SpO2: 96% 94%   Vitals:   12/06/17 2027 12/06/17 2150 12/07/17 0500 12/07/17 0850  BP:  121/60 122/65   Pulse:  67 72   Resp:  16 16   Temp:  99.3 F (37.4 C) 98.6 F (37 C)   TempSrc:  Oral Oral   SpO2: 98% 99% 96% 94%  Weight:      Height:       General: Pt is alert, awake, not in acute distress Cardiovascular: RRR, S1/S2 +, no rubs, no gallops Respiratory: CTA bilaterally, no wheezing, no rhonchi Abdominal: Soft, NT, Distended due to body habitus, bowel sounds + Extremities: Trace edema, no cyanosis  The results of significant diagnostics from this hospitalization (including imaging, microbiology, ancillary and laboratory) are listed below for reference.    Microbiology: No results found for this or any previous visit (from the past 240 hour(s)).   Labs: BNP (last 3 results) No results for input(s): BNP in the last 8760 hours. Basic Metabolic Panel: Recent Labs  Lab 12/06/17 0325 12/06/17 0847 12/07/17 0524  NA 141 142 141  K 3.4* 3.9 3.5  CL 106 106 106  CO2 27 28 29   GLUCOSE 190* 138* 123*  BUN 9 7* 7*  CREATININE 0.95 0.88 1.18*  CALCIUM 9.2 9.1 8.9  MG  --  1.7 1.9  PHOS  --   --  2.5   Liver Function Tests: Recent Labs  Lab 12/06/17 0325 12/06/17 0847 12/07/17 0524  AST 98* 168* 108*  ALT 67* 97* 94*  ALKPHOS 183* 189* 200*  BILITOT 0.8 1.4* 1.9*  PROT 8.2* 7.4 6.7  ALBUMIN 3.8 3.3* 2.9*   Recent Labs  Lab 12/06/17 0325 12/07/17 0524  LIPASE 266* 23   No results for input(s): AMMONIA in the last 168 hours. CBC: Recent Labs  Lab 12/06/17 0325 12/06/17 0847 12/07/17 0524  WBC 6.8 12.9* 7.3  NEUTROABS  --  11.0* 4.3  HGB 13.3 12.6 11.9*  HCT 43.1 40.3 38.3  MCV 92.1 91.6 92.3  PLT 250 263 245   Cardiac Enzymes: Recent Labs  Lab 12/06/17 0847  TROPONINI 0.03*   BNP: Invalid input(s): POCBNP CBG: Recent Labs  Lab 12/06/17 1948 12/07/17 0023 12/07/17 0458 12/07/17 0740 12/07/17 1148   GLUCAP 115* 103* 114* 113* 88   D-Dimer No results for input(s): DDIMER in the last 72 hours. Hgb A1c Recent Labs    12/06/17 0847  HGBA1C 6.2*   Lipid Profile Recent Labs    12/06/17 0847  TRIG 92   Thyroid function studies No results for input(s): TSH, T4TOTAL, T3FREE, THYROIDAB in the last 72 hours.  Invalid input(s): FREET3 Anemia work up No results for input(s): VITAMINB12, FOLATE, FERRITIN, TIBC, IRON, RETICCTPCT in the last 72 hours. Urinalysis    Component Value Date/Time   COLORURINE YELLOW 12/06/2017 0307   APPEARANCEUR HAZY (A) 12/06/2017 0307   LABSPEC 1.013 12/06/2017 0307   PHURINE 8.0 12/06/2017 0307   GLUCOSEU >=500 (A) 12/06/2017 0307   HGBUR NEGATIVE 12/06/2017 0307   BILIRUBINUR NEGATIVE 12/06/2017 0307   KETONESUR NEGATIVE 12/06/2017 0307   PROTEINUR 30 (A) 12/06/2017 0307   NITRITE NEGATIVE 12/06/2017 0307   LEUKOCYTESUR TRACE (A) 12/06/2017 0307   Sepsis Labs Invalid input(s): PROCALCITONIN,  WBC,  LACTICIDVEN Microbiology No results found for this or any previous visit (from the past 240 hour(s)).  Time coordinating discharge: 35 minutes  SIGNED:  Kerney Elbe, DO [facility] 12/07/2017, 2:03 PM Pager is on [place]  If 7PM-7AM, please contact night-coverage [email or web address] Password [email or web address]

## 2017-12-07 NOTE — Final Consult Note (Signed)
Consultant Final Sign-Off Note    Assessment/Final recommendations  Gabriella Butler is a 72 y.o. female followed by me for gallstone pancreatitis, choledocholithiasis, signs of cholecystitis. Patient would benefit from ERCP followed by lap chole but declines at this time. We discussed that she may improve on antibiotics but may also become sicker without the recommended surgical treatment. I encouraged the patient to return to the hospital for severe abdominal pain, intolerance of PO intake, or fever. Provided her with [facility] follow up info so she could schedule cholecystectomy. Since she is declining surgery then it is ok to advance her diet to a low fat diet as tolerated. Recommend PO abx for 10-14 days and follow up labs.   Wound care (if applicable):  N/A   Diet at discharge: heart healthy   Activity at discharge: per primary team   Follow-up appointment:     Pending results:  Unresulted Labs (From admission, onward)   None       Medication recommendations:   Other recommendations:    Thank you for allowing [place] to participate in the care of your patient!  Please consult [place] again if you have further needs for your patient.  [place] 12/07/2017 10:52 AM    Subjective   States abd pain resolved. Denies nausea or vomiting. Denies BM.   Objective  Vital signs in last 24 hours: Temp:  [98.6 F (37 C)-99.3 F (37.4 C)] 98.6 F (37 C) (11/07 0500) Pulse Rate:  [67-72] 72 (11/07 0500) Resp:  [16-18] 16 (11/07 0500) BP: (117-124)/(60-99) 122/65 (11/07 0500) SpO2:  [94 %-99 %] 94 % (11/07 0850)  General: sitting up, NAD HEENT: pupils equal, no scleral icterus CV: RRR, no m/r/g Pulm: normal effort, CTAB Abd: obese, soft, non-tender, no peritonitis Skin: warm and dry, no rashes Psych: A&Ox3  Pertinent labs and Studies: Recent Labs    12/06/17 0325 12/06/17 0847 12/07/17 0524  WBC 6.8 12.9* 7.3  HGB 13.3 12.6 11.9*  HCT 43.1 40.3 38.3   BMET Recent Labs    12/06/17 0847 12/07/17 0524  NA 142 141  K 3.9 3.5  CL 106 106  CO2 28 29  GLUCOSE 138* 123*  BUN 7* 7*  CREATININE 0.88 1.18*  CALCIUM 9.1 8.9   No results for input(s): LABURIN in the last 72 hours. No results found for this or any previous visit.  Imaging: No results found.

## 2017-12-07 NOTE — Discharge Instructions (Signed)
Low-Fat Diet for Pancreatitis or Gallbladder Conditions A low-fat diet can be helpful if you have pancreatitis or a gallbladder condition. With these conditions, your pancreas and gallbladder have trouble digesting fats. A healthy eating plan with less fat will help rest your pancreas and gallbladder and reduce your symptoms. What do I need to know about this diet?  Eat a low-fat diet. ? Reduce your fat intake to less than 20-30% of your total daily calories. This is less than 50-60 g of fat per day. ? Remember that you need some fat in your diet. Ask your dietician what your daily goal should be. ? Choose nonfat and low-fat healthy foods. Look for the words nonfat, low fat, or fat free. ? As a guide, look on the label and choose foods with less than 3 g of fat per serving. Eat only one serving.  Avoid alcohol.  Do not smoke. If you need help quitting, talk with your health care provider.  Eat small frequent meals instead of three large heavy meals. What foods can I eat? Grains Include healthy grains and starches such as potatoes, wheat bread, fiber-rich cereal, and brown rice. Choose whole grain options whenever possible. In adults, whole grains should account for 45-65% of your daily calories. Fruits and Vegetables Eat plenty of fruits and vegetables. Fresh fruits and vegetables add fiber to your diet. Meats and Other Protein Sources Eat lean meat such as chicken and pork. Trim any fat off of meat before cooking it. Eggs, fish, and beans are other sources of protein. In adults, these foods should account for 10-35% of your daily calories. Dairy Choose low-fat milk and dairy options. Dairy includes fat and protein, as well as calcium. Fats and Oils Limit high-fat foods such as fried foods, sweets, baked goods, sugary drinks. Other Creamy sauces and condiments, such as mayonnaise, can add extra fat. Think about whether or not you need to use them, or use smaller amounts or low fat  options. What foods are not recommended?  High fat foods, such as: ? [employer]. ? Ice cream. ? [place] toast. ? Sweet rolls. ? Pizza. ? Cheese bread. ? Foods covered with batter, butter, creamy sauces, or cheese. ? Fried foods. ? Sugary drinks and desserts.  Foods that cause gas or bloating This information is not intended to replace advice given to you by your health care provider. Make sure you discuss any questions you have with your health care provider. Document Released: 01/22/2013 Document Revised: 06/25/2015 Document Reviewed: 12/31/2012 Elsevier Interactive Patient Education  2017 [employer]. Acute Pancreatitis Acute pancreatitis is a condition in which the pancreas suddenly becomes irritated and swollen (has inflammation). The pancreas is a gland that is located behind the stomach. It produces enzymes that help to digest food. The pancreas also releases the hormones glucagon and insulin, which help to regulate blood sugar. Damage to the pancreas occurs when the digestive enzymes from the pancreas are activated before they are released into the intestine. Most acute attacks last a couple of days and can cause serious problems. Some people become dehydrated and develop low blood pressure. In severe cases, bleeding into the pancreas can lead to shock and can be life-threatening. The lungs, heart, and kidneys may fail. What are the causes? The most common causes of this condition are:  Alcohol abuse.  Gallstones.  Other causes include:  Certain medicines.  Exposure to certain chemicals.  Infection.  Damage caused by an accident (trauma).  Abdominal surgery.  In some cases,  the cause may not be known. What are the signs or symptoms? Symptoms of this condition include:  Pain in the upper abdomen that may radiate to the back.  Tenderness and swelling of the abdomen.  Nausea and vomiting.  How is this diagnosed? This condition may be diagnosed based on:  A  physical exam.  Blood tests.  Imaging tests, such as X-rays, CT scans, or an ultrasound of the abdomen.  How is this treated? Treatment for this condition usually requires a stay in the hospital. Treatment may include:  Pain medicine.  Fluid replacement through an IV tube.  Placing a tube in the stomach to remove stomach contents and to control vomiting (NG tube, or nasogastric tube).  Not eating for 3-4 days. This gives the pancreas a rest, because enzymes are not being produced that can cause further damage.  Antibiotic medicines, if your condition is caused by an infection.  Surgery on the pancreas or gallbladder.  Follow these instructions at home: Eating and drinking  Follow instructions from your health care provider about diet. This may involve avoiding alcohol and decreasing the amount of fat in your diet.  Eat smaller, more frequent meals. This reduces the amount of digestive fluids that the pancreas produces.  Drink enough fluid to keep your urine clear or pale yellow.  Do not drink alcohol if it caused your condition. General instructions  Take over-the-counter and prescription medicines only as told by your health care provider.  Do not use any tobacco products, such as cigarettes, chewing tobacco, and e-cigarettes. If you need help quitting, ask your health care provider.  Get plenty of rest.  If directed, check your blood sugar at home as told by your health care provider.  Keep all follow-up visits as told by your health care provider. This is important. Contact a health care provider if:  You do not recover as quickly as expected.  You develop new or worsening symptoms.  You have persistent pain, weakness, or nausea.  You recover and then have another episode of pain.  You have a fever. Get help right away if:  You cannot eat or keep fluids down.  Your pain becomes severe.  Your skin or the white part of your eyes turns yellow  (jaundice).  You vomit.  You feel dizzy or you faint.  Your blood sugar is high (over 300 mg/dL). This information is not intended to replace advice given to you by your health care provider. Make sure you discuss any questions you have with your health care provider. Document Released: 01/17/2005 Document Revised: 05/27/2015 Document Reviewed: 10/21/2014 Elsevier Interactive Patient Education  [employer].

## 2017-12-07 NOTE — Progress Notes (Addendum)
[facility] Progress Note    Since last GI note: She slept poorly overnight.  No serious abd pains.  Objective: Vital signs in last 24 hours: Temp:  [98.6 F (37 C)-99.3 F (37.4 C)] 98.6 F (37 C) (11/07 0500) Pulse Rate:  [67-79] 72 (11/07 0500) Resp:  [16-18] 16 (11/07 0500) BP: (117-129)/(60-99) 122/65 (11/07 0500) SpO2:  [96 %-100 %] 96 % (11/07 0500) Last BM Date: 12/04/17 General: alert and oriented times 3 Heart: regular rate and rythm Abdomen: soft, non-tender, non-distended, normal bowel sounds   Lab Results: Recent Labs    12/06/17 0325 12/06/17 0847 12/07/17 0524  WBC 6.8 12.9* 7.3  HGB 13.3 12.6 11.9*  PLT 250 263 245  MCV 92.1 91.6 92.3   Recent Labs    12/06/17 0325 12/06/17 0847 12/07/17 0524  NA 141 142 141  K 3.4* 3.9 3.5  CL 106 106 106  CO2 27 28 29   GLUCOSE 190* 138* 123*  BUN 9 7* 7*  CREATININE 0.95 0.88 1.18*  CALCIUM 9.2 9.1 8.9   Recent Labs    12/06/17 0325 12/06/17 0847 12/07/17 0524  PROT 8.2* 7.4 6.7  ALBUMIN 3.8 3.3* 2.9*  AST 98* 168* 108*  ALT 67* 97* 94*  ALKPHOS 183* 189* 200*  BILITOT 0.8 1.4* 1.9*  BILIDIR  --  0.7*  --   IBILI  --  0.7  --    Recent Labs    12/06/17 1157  INR 0.94     Studies/Results: Mr Abdomen Mrcp Wo Contrast  Result Date: 12/06/2017 CLINICAL DATA:  Vomiting, flank pain and gallstones on ultrasound examination. EXAM: MRI ABDOMEN WITHOUT CONTRAST  (INCLUDING MRCP) TECHNIQUE: Multiplanar multisequence MR imaging of the abdomen was performed. Heavily T2-weighted images of the biliary and pancreatic ducts were obtained, and three-dimensional MRCP images were rendered by post processing. COMPARISON:  Abdominal ultrasound 12/06/2017 FINDINGS: Lower chest: The lung bases are grossly clear. No infiltrates or large effusions. No pericardial effusion. Hepatobiliary: No focal hepatic lesions are identified. There is mild central intrahepatic biliary dilatation. The gallbladder is filled  with gallstones and demonstrates wall thickening and inflammation suggesting acute cholecystitis. Moderate dilatation of the common bile duct measuring up to 11 mm. Low insertion of the cystic duct. Abrupt caliber change without taper in the distal common bile duct. Suspect small common bile duct stones dependently. No obvious large impacted distal common bile duct stone. Pancreas:  No mass, inflammation or ductal dilatation. Spleen:  Normal size.  No focal lesions. Adrenals/Urinary Tract: The adrenal glands and kidneys are unremarkable. Stomach/Bowel: Visualized portions within the abdomen are unremarkable. Vascular/Lymphatic: No pathologically enlarged lymph nodes identified. No abdominal aortic aneurysm demonstrated. Other:  No ascites or abdominal wall hernia. Musculoskeletal: No significant bony findings. IMPRESSION: 1. Cholelithiasis and findings suggesting acute cholecystitis. There are small stones in the distal third of the common bile duct but no definite large impacted calculus. 2. Mild intra and extrahepatic biliary dilatation. No worrisome hepatic lesions. 3. Normal MR appearance of the pancreas without contrast. No evidence for acute pancreatitis or mass. Electronically Signed   By: Marijo Sanes M.D.   On: 12/06/2017 08:40   Mr 3d Recon At Scanner  Result Date: 12/06/2017 CLINICAL DATA:  Vomiting, flank pain and gallstones on ultrasound examination. EXAM: MRI ABDOMEN WITHOUT CONTRAST  (INCLUDING MRCP) TECHNIQUE: Multiplanar multisequence MR imaging of the abdomen was performed. Heavily T2-weighted images of the biliary and pancreatic ducts were obtained, and three-dimensional MRCP images were rendered by post processing. COMPARISON:  Abdominal ultrasound 12/06/2017 FINDINGS: Lower chest: The lung bases are grossly clear. No infiltrates or large effusions. No pericardial effusion. Hepatobiliary: No focal hepatic lesions are identified. There is mild central intrahepatic biliary dilatation. The  gallbladder is filled with gallstones and demonstrates wall thickening and inflammation suggesting acute cholecystitis. Moderate dilatation of the common bile duct measuring up to 11 mm. Low insertion of the cystic duct. Abrupt caliber change without taper in the distal common bile duct. Suspect small common bile duct stones dependently. No obvious large impacted distal common bile duct stone. Pancreas:  No mass, inflammation or ductal dilatation. Spleen:  Normal size.  No focal lesions. Adrenals/Urinary Tract: The adrenal glands and kidneys are unremarkable. Stomach/Bowel: Visualized portions within the abdomen are unremarkable. Vascular/Lymphatic: No pathologically enlarged lymph nodes identified. No abdominal aortic aneurysm demonstrated. Other:  No ascites or abdominal wall hernia. Musculoskeletal: No significant bony findings. IMPRESSION: 1. Cholelithiasis and findings suggesting acute cholecystitis. There are small stones in the distal third of the common bile duct but no definite large impacted calculus. 2. Mild intra and extrahepatic biliary dilatation. No worrisome hepatic lesions. 3. Normal MR appearance of the pancreas without contrast. No evidence for acute pancreatitis or mass. Electronically Signed   By: Marijo Sanes M.D.   On: 12/06/2017 08:40   US Abdomen Limited  Result Date: 12/06/2017 CLINICAL DATA:  Right upper quadrant pain. Nausea and vomiting today. Elevated lipase and transaminases EXAM: ULTRASOUND ABDOMEN LIMITED RIGHT UPPER QUADRANT COMPARISON:  None. FINDINGS: Gallbladder: Cholelithiasis with multiple stones in the gallbladder measuring up to about 9 mm diameter. No gallbladder wall thickening. Murphy's sign is positive. Common bile duct: Diameter: 7 mm, upper limits of normal. Liver: Diffusely increased parenchymal echotexture likely representing fatty infiltration. No focal lesions identified. Portal vein is patent on color Doppler imaging with normal direction of blood flow towards  the liver. IMPRESSION: Cholelithiasis with positive Murphy's sign. These findings suggest acute cholecystitis in the appropriate clinical setting. Electronically Signed   By: Lucienne Capers M.D.   On: 12/06/2017 05:38     Medications: Scheduled Meds: . budesonide (PULMICORT) nebulizer solution  0.25 mg Nebulization BID  . insulin aspart  0-9 Units Subcutaneous Q4H   Continuous Infusions: . piperacillin-tazobactam (ZOSYN)  IV 3.375 g (12/07/17 0530)   PRN Meds:.acetaminophen **OR** acetaminophen, albuterol, morphine injection, ondansetron **OR** ondansetron (ZOFRAN) IV    Assessment/Plan: 72 y.o. female with cholelithiasis, choledocholithiasis and possible cholecystitis  She declines to consent to ERCP this morning. She is not sure if she will ever consent.  I discussed this with her and her cousin in the room, tried to address all of her concerns/questions. She understands that she may get much sicker without the recommended procedures.  She understands that our ability to do ERCP tomorrow and over the coming weekend may be very limited.  She still declines.  Signing off. OK to advance her diet if that is OK with general surgery.  I recommend a prolonged course of antibiotics, follow her LFTs, CBC serially.  Please call or page with any further questions or concerns.   Milus Banister, MD  12/07/2017, 8:16 AM [facility] Pager [phone]

## 2017-12-07 NOTE — Progress Notes (Signed)
Patient was discharged home, instructions were given to patient and all questions were answered. Patient was taken to main entrance via wheelchair.

## 2017-12-07 NOTE — Progress Notes (Signed)
Patient was able to eat lunch (heart healthy diet) and she tolerated it well. Will continue to monitor while awaiting discharge orders.

## 2019-04-22 ENCOUNTER — Emergency Department (HOSPITAL_COMMUNITY): Payer: Medicare Other

## 2019-04-22 ENCOUNTER — Encounter (HOSPITAL_COMMUNITY): Payer: Self-pay | Admitting: Emergency Medicine

## 2019-04-22 ENCOUNTER — Other Ambulatory Visit: Payer: Self-pay

## 2019-04-22 ENCOUNTER — Emergency Department (HOSPITAL_COMMUNITY)
Admission: EM | Admit: 2019-04-22 | Discharge: 2019-04-23 | Disposition: A | Discharge status: Home / Self Care | Payer: Medicare Other | Attending: Emergency Medicine | Admitting: Emergency Medicine

## 2019-04-22 DIAGNOSIS — Z79899 Other long term (current) drug therapy: Secondary | ICD-10-CM | POA: Insufficient documentation

## 2019-04-22 DIAGNOSIS — R531 Weakness: Secondary | ICD-10-CM | POA: Diagnosis not present

## 2019-04-22 DIAGNOSIS — R42 Dizziness and giddiness: Secondary | ICD-10-CM | POA: Diagnosis not present

## 2019-04-22 DIAGNOSIS — J45909 Unspecified asthma, uncomplicated: Secondary | ICD-10-CM | POA: Diagnosis not present

## 2019-04-22 DIAGNOSIS — R0602 Shortness of breath: Secondary | ICD-10-CM | POA: Diagnosis not present

## 2019-04-22 DIAGNOSIS — R2243 Localized swelling, mass and lump, lower limb, bilateral: Secondary | ICD-10-CM | POA: Diagnosis not present

## 2019-04-22 DIAGNOSIS — I10 Essential (primary) hypertension: Secondary | ICD-10-CM | POA: Diagnosis not present

## 2019-04-22 DIAGNOSIS — Z20822 Contact with and (suspected) exposure to covid-19: Secondary | ICD-10-CM | POA: Diagnosis not present

## 2019-04-22 DIAGNOSIS — R000 Tachycardia, unspecified: Secondary | ICD-10-CM | POA: Diagnosis not present

## 2019-04-22 LAB — CBC WITH DIFFERENTIAL/PLATELET
Abs Immature Granulocytes: 0.03 10*3/uL (ref 0.00–0.07)
Basophils Absolute: 0 10*3/uL (ref 0.0–0.1)
Basophils Relative: 1 %
Eosinophils Absolute: 0.2 10*3/uL (ref 0.0–0.5)
Eosinophils Relative: 2 %
HCT: 43.9 % (ref 36.0–46.0)
Hemoglobin: 13.6 g/dL (ref 12.0–15.0)
Immature Granulocytes: 1 %
Lymphocytes Relative: 31 %
Lymphs Abs: 1.9 10*3/uL (ref 0.7–4.0)
MCH: 29.3 pg (ref 26.0–34.0)
MCHC: 31 g/dL (ref 30.0–36.0)
MCV: 94.6 fL (ref 80.0–100.0)
Monocytes Absolute: 0.7 10*3/uL (ref 0.1–1.0)
Monocytes Relative: 12 %
Neutro Abs: 3.3 10*3/uL (ref 1.7–7.7)
Neutrophils Relative %: 53 %
Platelets: 254 10*3/uL (ref 150–400)
RBC: 4.64 MIL/uL (ref 3.87–5.11)
RDW: 13.8 % (ref 11.5–15.5)
WBC: 6.1 10*3/uL (ref 4.0–10.5)
nRBC: 0 % (ref 0.0–0.2)

## 2019-04-22 LAB — COMPREHENSIVE METABOLIC PANEL
ALT: 18 U/L (ref 0–44)
AST: 20 U/L (ref 15–41)
Albumin: 3.9 g/dL (ref 3.5–5.0)
Alkaline Phosphatase: 61 U/L (ref 38–126)
Anion gap: 7 (ref 5–15)
BUN: 10 mg/dL (ref 8–23)
CO2: 26 mmol/L (ref 22–32)
Calcium: 10 mg/dL (ref 8.9–10.3)
Chloride: 108 mmol/L (ref 98–111)
Creatinine, Ser: 0.98 mg/dL (ref 0.44–1.00)
GFR calc Af Amer: >60 mL/min (ref >60)
GFR calc non Af Amer: 58 mL/min — ABNORMAL LOW (ref >60)
Glucose, Bld: 116 mg/dL — ABNORMAL HIGH (ref 70–99)
Potassium: 4 mmol/L (ref 3.5–5.1)
Sodium: 141 mmol/L (ref 135–145)
Total Bilirubin: 0.5 mg/dL (ref 0.3–1.2)
Total Protein: 8.1 g/dL (ref 6.5–8.1)

## 2019-04-22 LAB — POC SARS CORONAVIRUS 2 AG -  ED: SARS Coronavirus 2 Ag: NEGATIVE

## 2019-04-22 LAB — D-DIMER, QUANTITATIVE: D-Dimer, Quant: 0.71 ug/mL-FEU — ABNORMAL HIGH (ref 0.00–0.50)

## 2019-04-22 LAB — TROPONIN I (HIGH SENSITIVITY): Troponin I (High Sensitivity): 4 ng/L (ref <18)

## 2019-04-22 LAB — BRAIN NATRIURETIC PEPTIDE: B Natriuretic Peptide: 59.2 pg/mL (ref 0.0–100.0)

## 2019-04-22 LAB — CBG MONITORING, ED: Glucose-Capillary: 100 mg/dL — ABNORMAL HIGH (ref 70–99)

## 2019-04-22 MED ORDER — ALBUTEROL SULFATE HFA 108 (90 BASE) MCG/ACT IN AERS
4.0000 | INHALATION_SPRAY | Freq: Once | RESPIRATORY_TRACT | Status: AC
  Administered 2019-04-22: 4 via RESPIRATORY_TRACT
  Filled 2019-04-22: qty 6.7

## 2019-04-22 NOTE — ED Triage Notes (Signed)
Pt arrived via EMS from home. Pt became short of breath around 7pm. Pt felt weak and dizzy. Pt takes albuterol for her breathing. Pt took her albuterol for her Shortness of breath, and it helped, but she still felt weak and dizzy. Pt has a hx of asthma. Pt reports no hx of hypertension.

## 2019-04-22 NOTE — ED Provider Notes (Signed)
[facility] DEPT Provider Note   CSN: [id number] Arrival date & time: 04/22/19  2102     History Chief Complaint  Patient presents with  . Weakness  . Dizziness    Gabriella Butler is a 74 y.o. female.  HPI 74 year old female presents with near syncope.  At around 8 PM she was about to have dinner and felt chills and shortness of breath.  Felt lightheaded when she stood up to walk.  Called 911 and they recommended she use her albuterol.  She states she does not often use it.  Denies any cough.  Her shortness of breath and dizziness felt better after using it and now she has no dizziness.  She does feel little short of breath but is much better.  Has been having bilateral leg swelling for the last couple days.  No fever, chest pain, palpitations.  No headache.  Never passed out.   Past Medical History:  Diagnosis Date  . Asthma   . Bronchitis   . Gout   . Hypertension     Patient Active Problem List   Diagnosis Date Noted  . Elevated glucose level   . Acute gallstone pancreatitis 12/06/2017  . Acute cholecystitis 12/06/2017  . Essential hypertension 12/06/2017  . Asthma 12/06/2017  . Abnormal magnetic resonance cholangiopancreatography (MRCP)   . Choledocholithiasis     History reviewed. No pertinent surgical history.   OB History   No obstetric history on file.     Family History  Problem Relation Age of Onset  . Hypertension Father     Social History   Tobacco Use  . Smoking status: Never Smoker  . Smokeless tobacco: Never Used  Substance Use Topics  . Alcohol use: No  . Drug use: No    Home Medications Prior to Admission medications   Medication Sig Start Date End Date Taking? Authorizing Provider  albuterol (VENTOLIN HFA) 108 (90 Base) MCG/ACT inhaler Inhale 1-2 puffs into the lungs every 4 (four) hours as needed for wheezing or shortness of breath. 04/23/19   Sherwood Gambler, MD  beclomethasone (QVAR) 40 MCG/ACT inhaler  Inhale 1 puff into the lungs 2 (two) times daily. Patient not taking: Reported on 04/22/2019 02/04/17   Dorie Rank, MD  Cetirizine HCl 10 MG CAPS Take 1 capsule (10 mg total) by mouth daily for 10 days. Patient not taking: Reported on 12/06/2017 07/12/17 07/22/17  Wieters, Hallie C, PA-C  ondansetron (ZOFRAN) 4 MG tablet Take 1 tablet (4 mg total) by mouth every 6 (six) hours as needed for nausea. Patient not taking: Reported on 04/22/2019 12/07/17   Raiford Noble Latif, DO  traMADol (ULTRAM) 50 MG tablet Take 1 tablet (50 mg total) by mouth every 6 (six) hours as needed. Patient not taking: Reported on 12/06/2017 02/04/17   Dorie Rank, MD    Allergies    Patient has no known allergies.  Review of Systems   Review of Systems  Constitutional: Negative for fever.  Respiratory: Positive for shortness of breath. Negative for cough.   Cardiovascular: Negative for chest pain.  Gastrointestinal: Negative for abdominal pain and vomiting.  Neurological: Positive for light-headedness. Negative for syncope and headaches.  All other systems reviewed and are negative.   Physical Exam Updated Vital Signs BP (!) 170/84   Pulse 68   Temp 98.4 F (36.9 C) (Oral)   Resp 19   Ht 5\' 5"  (1.651 m)   Wt 90.7 kg   SpO2 99%   BMI 33.27 kg/m  Physical Exam Vitals and nursing note reviewed.  Constitutional:      General: She is not in acute distress.    Appearance: She is well-developed. She is obese. She is not ill-appearing or diaphoretic.  HENT:     Head: Normocephalic and atraumatic.     Right Ear: External ear normal.     Left Ear: External ear normal.     Nose: Nose normal.  Eyes:     General:        Right eye: No discharge.        Left eye: No discharge.  Cardiovascular:     Rate and Rhythm: Normal rate and regular rhythm.     Heart sounds: Normal heart sounds.  Pulmonary:     Effort: Pulmonary effort is normal.     Breath sounds: Wheezing (mild, diffuse, expiratory) present.  Abdominal:      Palpations: Abdomen is soft.     Tenderness: There is no abdominal tenderness.  Musculoskeletal:     Right lower leg: Edema present.     Left lower leg: Edema present.     Comments: Mild pedal edema  Skin:    General: Skin is warm and dry.  Neurological:     Mental Status: She is alert.  Psychiatric:        Mood and Affect: Mood is not anxious.     ED Results / Procedures / Treatments   Labs (all labs ordered are listed, but only abnormal results are displayed) Labs Reviewed  COMPREHENSIVE METABOLIC PANEL - Abnormal; Notable for the following components:      Result Value   Glucose, Bld 116 (*)    GFR calc non Af Amer 58 (*)    All other components within normal limits  D-DIMER, QUANTITATIVE (NOT AT [facility]) - Abnormal; Notable for the following components:   D-Dimer, Quant 0.71 (*)    All other components within normal limits  CBG MONITORING, ED - Abnormal; Notable for the following components:   Glucose-Capillary 100 (*)    All other components within normal limits  CBC WITH DIFFERENTIAL/PLATELET  BRAIN NATRIURETIC PEPTIDE  POC SARS CORONAVIRUS 2 AG -  ED  TROPONIN I (HIGH SENSITIVITY)  TROPONIN I (HIGH SENSITIVITY)    EKG EKG Interpretation  Date/Time:  Monday April 22 2019 21:17:12 EDT Ventricular Rate:  72 PR Interval:    QRS Duration: 86 QT Interval:  413 QTC Calculation: 452 R Axis:   10 Text Interpretation: Sinus arrhythmia Low voltage, precordial leads Abnormal ECG Confirmed by Carmin Muskrat [phone]) on 04/22/2019 9:45:18 PM   Radiology DG Chest Portable 1 View  Result Date: 04/22/2019 CLINICAL DATA:  74 year old female with acute shortness of breath at 1900 hours. EXAM: PORTABLE CHEST 1 VIEW COMPARISON:  Chest radiographs 09/14/2016 and earlier. FINDINGS: Portable AP upright view at 2205 hours. Lung volumes and mediastinal contours remain within normal limits. Visualized tracheal air column is within normal limits. Allowing for portable technique the  lungs are clear. No pneumothorax. Stable mild eventration of the diaphragm (normal variant). No acute osseous abnormality identified. IMPRESSION: Negative portable chest. Electronically Signed   By: Genevie Ann M.D.   On: 04/22/2019 22:21    Procedures Procedures (including critical care time)  Medications Ordered in ED Medications  albuterol (VENTOLIN HFA) 108 (90 Base) MCG/ACT inhaler 4 puff (4 puffs Inhalation Given 04/22/19 2207)    ED Course  I have reviewed the triage vital signs and the nursing notes.  Pertinent labs & imaging results that  were available during my care of the patient were reviewed by me and considered in my medical decision making (see chart for details).    MDM Rules/Calculators/A&P                      Patient's shortness of breath resolved with albuterol.  She is well-appearing.  She never passed out.  She did have shortness of breath with lightheadedness so D-dimer sent to help work-up PE as she does not have any other obvious high risk features.  D-dimer is negative when age-adjusted.  Thus I doubt pulmonary embolism.  Covid seems less likely given no fever or cough but will test.  Given improvement with albuterol, could be a mild flare of her asthma/bronchitis.  At this point, with troponins negative x2, I think ACS is highly unlikely as well.  Will discharge home and advised her to follow-up with PCP.  Gabriella Butler was evaluated in Emergency Department on 04/23/2019 for the symptoms described in the history of present illness. She was evaluated in the context of the global COVID-19 pandemic, which necessitated consideration that the patient might be at risk for infection with the SARS-CoV-2 virus that causes COVID-19. Institutional protocols and algorithms that pertain to the evaluation of patients at risk for COVID-19 are in a state of rapid change based on information released by regulatory bodies including the CDC and federal and state organizations. These policies  and algorithms were followed during the patient's care in the ED.  Final Clinical Impression(s) / ED Diagnoses Final diagnoses:  Shortness of breath    Rx / DC Orders ED Discharge Orders         Ordered    albuterol (VENTOLIN HFA) 108 (90 Base) MCG/ACT inhaler  Every 4 hours PRN     04/23/19 0100           Sherwood Gambler, MD 04/23/19 0105

## 2019-04-23 DIAGNOSIS — R0602 Shortness of breath: Secondary | ICD-10-CM | POA: Diagnosis not present

## 2019-04-23 LAB — SARS CORONAVIRUS 2 (TAT 6-24 HRS): SARS Coronavirus 2: NEGATIVE

## 2019-04-23 LAB — TROPONIN I (HIGH SENSITIVITY): Troponin I (High Sensitivity): 8 ng/L (ref <18)

## 2019-04-23 MED ORDER — ALBUTEROL SULFATE HFA 108 (90 BASE) MCG/ACT IN AERS: 1.0000 | INHALATION_SPRAY | RESPIRATORY_TRACT | 0 refills | Status: AC | PRN

## 2019-04-23 NOTE — Discharge Instructions (Signed)
If you develop new or worsening shortness of breath, chest pain, vomiting, fever, or any other new/concerning symptoms then return to the ER for evaluation  You may use the albuterol inhaler 1-2 puffs every 4 hours as needed for shortness of breath or wheezing.

## 2019-04-29 ENCOUNTER — Telehealth: Payer: Self-pay | Admitting: General Practice

## 2019-04-29 NOTE — Telephone Encounter (Signed)
Negative COVID results given. Patient results "NOT Detected." Caller expressed understanding. ° °

## 2019-05-23 ENCOUNTER — Inpatient Hospital Stay (HOSPITAL_COMMUNITY)
Admission: EM | Admit: 2019-05-23 | Discharge: 2019-05-28 | DRG: 871 | Disposition: A | Discharge status: Skilled Nursing Facility | Payer: Medicare Other | Attending: Internal Medicine | Admitting: Internal Medicine

## 2019-05-23 ENCOUNTER — Encounter (HOSPITAL_COMMUNITY): Payer: Self-pay

## 2019-05-23 ENCOUNTER — Other Ambulatory Visit: Payer: Self-pay

## 2019-05-23 ENCOUNTER — Inpatient Hospital Stay (HOSPITAL_COMMUNITY): Payer: Medicare Other

## 2019-05-23 ENCOUNTER — Emergency Department (HOSPITAL_COMMUNITY): Payer: Medicare Other

## 2019-05-23 DIAGNOSIS — Z79899 Other long term (current) drug therapy: Secondary | ICD-10-CM | POA: Diagnosis not present

## 2019-05-23 DIAGNOSIS — I700 Atherosclerosis of aorta: Secondary | ICD-10-CM | POA: Diagnosis present

## 2019-05-23 DIAGNOSIS — K821 Hydrops of gallbladder: Secondary | ICD-10-CM | POA: Diagnosis present

## 2019-05-23 DIAGNOSIS — E669 Obesity, unspecified: Secondary | ICD-10-CM | POA: Diagnosis not present

## 2019-05-23 DIAGNOSIS — I959 Hypotension, unspecified: Secondary | ICD-10-CM | POA: Diagnosis present

## 2019-05-23 DIAGNOSIS — Z434 Encounter for attention to other artificial openings of digestive tract: Secondary | ICD-10-CM | POA: Diagnosis not present

## 2019-05-23 DIAGNOSIS — K5900 Constipation, unspecified: Secondary | ICD-10-CM | POA: Diagnosis present

## 2019-05-23 DIAGNOSIS — R748 Abnormal levels of other serum enzymes: Secondary | ICD-10-CM | POA: Diagnosis present

## 2019-05-23 DIAGNOSIS — J690 Pneumonitis due to inhalation of food and vomit: Secondary | ICD-10-CM | POA: Diagnosis not present

## 2019-05-23 DIAGNOSIS — M109 Gout, unspecified: Secondary | ICD-10-CM | POA: Diagnosis present

## 2019-05-23 DIAGNOSIS — K819 Cholecystitis, unspecified: Secondary | ICD-10-CM

## 2019-05-23 DIAGNOSIS — K2900 Acute gastritis without bleeding: Secondary | ICD-10-CM | POA: Diagnosis not present

## 2019-05-23 DIAGNOSIS — R0902 Hypoxemia: Secondary | ICD-10-CM | POA: Diagnosis not present

## 2019-05-23 DIAGNOSIS — J188 Other pneumonia, unspecified organism: Secondary | ICD-10-CM | POA: Diagnosis not present

## 2019-05-23 DIAGNOSIS — I10 Essential (primary) hypertension: Secondary | ICD-10-CM | POA: Diagnosis not present

## 2019-05-23 DIAGNOSIS — Z6833 Body mass index (BMI) 33.0-33.9, adult: Secondary | ICD-10-CM

## 2019-05-23 DIAGNOSIS — J40 Bronchitis, not specified as acute or chronic: Secondary | ICD-10-CM | POA: Diagnosis present

## 2019-05-23 DIAGNOSIS — Z87898 Personal history of other specified conditions: Secondary | ICD-10-CM

## 2019-05-23 DIAGNOSIS — R404 Transient alteration of awareness: Secondary | ICD-10-CM | POA: Diagnosis not present

## 2019-05-23 DIAGNOSIS — N179 Acute kidney failure, unspecified: Secondary | ICD-10-CM | POA: Diagnosis present

## 2019-05-23 DIAGNOSIS — Z79891 Long term (current) use of opiate analgesic: Secondary | ICD-10-CM

## 2019-05-23 DIAGNOSIS — Z8249 Family history of ischemic heart disease and other diseases of the circulatory system: Secondary | ICD-10-CM | POA: Diagnosis not present

## 2019-05-23 DIAGNOSIS — K529 Noninfective gastroenteritis and colitis, unspecified: Secondary | ICD-10-CM | POA: Diagnosis not present

## 2019-05-23 DIAGNOSIS — J9811 Atelectasis: Secondary | ICD-10-CM | POA: Diagnosis present

## 2019-05-23 DIAGNOSIS — R739 Hyperglycemia, unspecified: Secondary | ICD-10-CM | POA: Diagnosis present

## 2019-05-23 DIAGNOSIS — K8050 Calculus of bile duct without cholangitis or cholecystitis without obstruction: Secondary | ICD-10-CM | POA: Diagnosis not present

## 2019-05-23 DIAGNOSIS — A419 Sepsis, unspecified organism: Secondary | ICD-10-CM

## 2019-05-23 DIAGNOSIS — E878 Other disorders of electrolyte and fluid balance, not elsewhere classified: Secondary | ICD-10-CM | POA: Diagnosis not present

## 2019-05-23 DIAGNOSIS — K851 Biliary acute pancreatitis without necrosis or infection: Secondary | ICD-10-CM | POA: Diagnosis present

## 2019-05-23 DIAGNOSIS — R000 Tachycardia, unspecified: Secondary | ICD-10-CM | POA: Diagnosis not present

## 2019-05-23 DIAGNOSIS — J45909 Unspecified asthma, uncomplicated: Secondary | ICD-10-CM | POA: Diagnosis present

## 2019-05-23 DIAGNOSIS — E876 Hypokalemia: Secondary | ICD-10-CM | POA: Diagnosis not present

## 2019-05-23 DIAGNOSIS — K810 Acute cholecystitis: Secondary | ICD-10-CM | POA: Diagnosis not present

## 2019-05-23 DIAGNOSIS — R7303 Prediabetes: Secondary | ICD-10-CM | POA: Diagnosis present

## 2019-05-23 DIAGNOSIS — R0689 Other abnormalities of breathing: Secondary | ICD-10-CM | POA: Diagnosis not present

## 2019-05-23 DIAGNOSIS — R41841 Cognitive communication deficit: Secondary | ICD-10-CM | POA: Diagnosis not present

## 2019-05-23 DIAGNOSIS — D259 Leiomyoma of uterus, unspecified: Secondary | ICD-10-CM | POA: Diagnosis not present

## 2019-05-23 DIAGNOSIS — R1312 Dysphagia, oropharyngeal phase: Secondary | ICD-10-CM | POA: Diagnosis not present

## 2019-05-23 DIAGNOSIS — K8000 Calculus of gallbladder with acute cholecystitis without obstruction: Secondary | ICD-10-CM | POA: Diagnosis present

## 2019-05-23 DIAGNOSIS — R7881 Bacteremia: Secondary | ICD-10-CM | POA: Diagnosis not present

## 2019-05-23 DIAGNOSIS — A4159 Other Gram-negative sepsis: Secondary | ICD-10-CM | POA: Diagnosis not present

## 2019-05-23 DIAGNOSIS — K8020 Calculus of gallbladder without cholecystitis without obstruction: Secondary | ICD-10-CM | POA: Diagnosis not present

## 2019-05-23 DIAGNOSIS — J189 Pneumonia, unspecified organism: Secondary | ICD-10-CM | POA: Diagnosis present

## 2019-05-23 DIAGNOSIS — Z20822 Contact with and (suspected) exposure to covid-19: Secondary | ICD-10-CM | POA: Diagnosis present

## 2019-05-23 DIAGNOSIS — E1169 Type 2 diabetes mellitus with other specified complication: Secondary | ICD-10-CM | POA: Diagnosis not present

## 2019-05-23 DIAGNOSIS — R6520 Severe sepsis without septic shock: Secondary | ICD-10-CM | POA: Diagnosis not present

## 2019-05-23 DIAGNOSIS — J984 Other disorders of lung: Secondary | ICD-10-CM | POA: Diagnosis not present

## 2019-05-23 DIAGNOSIS — M6281 Muscle weakness (generalized): Secondary | ICD-10-CM | POA: Diagnosis not present

## 2019-05-23 DIAGNOSIS — N178 Other acute kidney failure: Secondary | ICD-10-CM | POA: Diagnosis not present

## 2019-05-23 DIAGNOSIS — E668 Other obesity: Secondary | ICD-10-CM | POA: Diagnosis not present

## 2019-05-23 DIAGNOSIS — A4189 Other specified sepsis: Secondary | ICD-10-CM | POA: Diagnosis not present

## 2019-05-23 HISTORY — PX: IR PERC CHOLECYSTOSTOMY: IMG2326

## 2019-05-23 LAB — POCT I-STAT 7, (LYTES, BLD GAS, ICA,H+H)
Acid-base deficit: 5 mmol/L — ABNORMAL HIGH (ref 0.0–2.0)
Bicarbonate: 18.4 mmol/L — ABNORMAL LOW (ref 20.0–28.0)
Calcium, Ion: 1.26 mmol/L (ref 1.15–1.40)
HCT: 40 % (ref 36.0–46.0)
Hemoglobin: 13.6 g/dL (ref 12.0–15.0)
O2 Saturation: 95 %
Patient temperature: 103.3
Potassium: 2.8 mmol/L — ABNORMAL LOW (ref 3.5–5.1)
Sodium: 138 mmol/L (ref 135–145)
TCO2: 19 mmol/L — ABNORMAL LOW (ref 22–32)
pCO2 arterial: 32.7 mmHg (ref 32.0–48.0)
pH, Arterial: 7.37 (ref 7.350–7.450)
pO2, Arterial: 89 mmHg (ref 83.0–108.0)

## 2019-05-23 LAB — COMPREHENSIVE METABOLIC PANEL
ALT: 18 U/L (ref 0–44)
AST: 25 U/L (ref 15–41)
Albumin: 3 g/dL — ABNORMAL LOW (ref 3.5–5.0)
Alkaline Phosphatase: 180 U/L — ABNORMAL HIGH (ref 38–126)
Anion gap: 14 (ref 5–15)
BUN: 24 mg/dL — ABNORMAL HIGH (ref 8–23)
CO2: 17 mmol/L — ABNORMAL LOW (ref 22–32)
Calcium: 9.4 mg/dL (ref 8.9–10.3)
Chloride: 104 mmol/L (ref 98–111)
Creatinine, Ser: 1.77 mg/dL — ABNORMAL HIGH (ref 0.44–1.00)
GFR calc Af Amer: 33 mL/min — ABNORMAL LOW (ref >60)
GFR calc non Af Amer: 28 mL/min — ABNORMAL LOW (ref >60)
Glucose, Bld: 186 mg/dL — ABNORMAL HIGH (ref 70–99)
Potassium: 3.3 mmol/L — ABNORMAL LOW (ref 3.5–5.1)
Sodium: 135 mmol/L (ref 135–145)
Total Bilirubin: 1.1 mg/dL (ref 0.3–1.2)
Total Protein: 7.3 g/dL (ref 6.5–8.1)

## 2019-05-23 LAB — BLOOD CULTURE ID PANEL (REFLEXED)
Acinetobacter baumannii: NOT DETECTED
Candida albicans: NOT DETECTED
Candida glabrata: NOT DETECTED
Candida krusei: NOT DETECTED
Candida parapsilosis: NOT DETECTED
Candida tropicalis: NOT DETECTED
Carbapenem resistance: NOT DETECTED
Enterobacter cloacae complex: NOT DETECTED
Enterobacteriaceae species: DETECTED — AB
Enterococcus species: NOT DETECTED
Escherichia coli: NOT DETECTED
Haemophilus influenzae: NOT DETECTED
Klebsiella oxytoca: DETECTED — AB
Klebsiella pneumoniae: NOT DETECTED
Listeria monocytogenes: NOT DETECTED
Neisseria meningitidis: NOT DETECTED
Proteus species: NOT DETECTED
Pseudomonas aeruginosa: NOT DETECTED
Serratia marcescens: NOT DETECTED
Staphylococcus aureus (BCID): NOT DETECTED
Staphylococcus species: NOT DETECTED
Streptococcus agalactiae: NOT DETECTED
Streptococcus pneumoniae: NOT DETECTED
Streptococcus pyogenes: NOT DETECTED
Streptococcus species: DETECTED — AB

## 2019-05-23 LAB — CBC WITH DIFFERENTIAL/PLATELET
Abs Immature Granulocytes: 0.15 10*3/uL — ABNORMAL HIGH (ref 0.00–0.07)
Basophils Absolute: 0.1 10*3/uL (ref 0.0–0.1)
Basophils Relative: 1 %
Eosinophils Absolute: 0 10*3/uL (ref 0.0–0.5)
Eosinophils Relative: 0 %
HCT: 45.7 % (ref 36.0–46.0)
Hemoglobin: 14.9 g/dL (ref 12.0–15.0)
Immature Granulocytes: 2 %
Lymphocytes Relative: 8 %
Lymphs Abs: 0.8 10*3/uL (ref 0.7–4.0)
MCH: 29.1 pg (ref 26.0–34.0)
MCHC: 32.6 g/dL (ref 30.0–36.0)
MCV: 89.3 fL (ref 80.0–100.0)
Monocytes Absolute: 0.1 10*3/uL (ref 0.1–1.0)
Monocytes Relative: 1 %
Neutro Abs: 9.1 10*3/uL — ABNORMAL HIGH (ref 1.7–7.7)
Neutrophils Relative %: 88 %
Platelets: 208 10*3/uL (ref 150–400)
RBC: 5.12 MIL/uL — ABNORMAL HIGH (ref 3.87–5.11)
RDW: 13.6 % (ref 11.5–15.5)
WBC: 10.2 10*3/uL (ref 4.0–10.5)
nRBC: 0 % (ref 0.0–0.2)

## 2019-05-23 LAB — URINALYSIS, ROUTINE W REFLEX MICROSCOPIC
Bacteria, UA: NONE SEEN
Bilirubin Urine: NEGATIVE
Glucose, UA: 150 mg/dL — AB
Ketones, ur: 5 mg/dL — AB
Leukocytes,Ua: NEGATIVE
Nitrite: NEGATIVE
Protein, ur: 100 mg/dL — AB
Specific Gravity, Urine: 1.021 (ref 1.005–1.030)
pH: 6 (ref 5.0–8.0)

## 2019-05-23 LAB — MAGNESIUM: Magnesium: 1.6 mg/dL — ABNORMAL LOW (ref 1.7–2.4)

## 2019-05-23 LAB — PROTIME-INR
INR: 1.2 (ref 0.8–1.2)
Prothrombin Time: 15.5 seconds — ABNORMAL HIGH (ref 11.4–15.2)

## 2019-05-23 LAB — LACTIC ACID, PLASMA
Lactic Acid, Venous: 3.8 mmol/L (ref 0.5–1.9)
Lactic Acid, Venous: 3.6 mmol/L (ref 0.5–1.9)

## 2019-05-23 LAB — RESPIRATORY PANEL BY RT PCR (FLU A&B, COVID)
Influenza A by PCR: NEGATIVE
Influenza B by PCR: NEGATIVE
SARS Coronavirus 2 by RT PCR: NEGATIVE

## 2019-05-23 LAB — APTT: aPTT: 28 seconds (ref 24–36)

## 2019-05-23 LAB — AMMONIA: Ammonia: 26 umol/L (ref 9–35)

## 2019-05-23 LAB — CBG MONITORING, ED: Glucose-Capillary: 166 mg/dL — ABNORMAL HIGH (ref 70–99)

## 2019-05-23 MED ORDER — MORPHINE SULFATE (PF) 2 MG/ML IV SOLN: 1.0000 mg | INTRAVENOUS | Status: DC | PRN

## 2019-05-23 MED ORDER — SODIUM CHLORIDE 0.9 % IV BOLUS
1000.0000 mL | Freq: Once | INTRAVENOUS | Status: AC
  Administered 2019-05-23: 03:00:00 1000 mL via INTRAVENOUS

## 2019-05-23 MED ORDER — METRONIDAZOLE IN NACL 5-0.79 MG/ML-% IV SOLN
500.0000 mg | Freq: Once | INTRAVENOUS | Status: AC
  Administered 2019-05-23: 03:00:00 500 mg via INTRAVENOUS
  Filled 2019-05-23: qty 100

## 2019-05-23 MED ORDER — MIDAZOLAM HCL 2 MG/2ML IJ SOLN
INTRAMUSCULAR | Status: AC | PRN
  Administered 2019-05-23: 1 mg via INTRAVENOUS

## 2019-05-23 MED ORDER — POTASSIUM CHLORIDE 10 MEQ/100ML IV SOLN
10.0000 meq | INTRAVENOUS | Status: AC
  Administered 2019-05-23 (×3): 10 meq via INTRAVENOUS
  Filled 2019-05-23 (×3): qty 100

## 2019-05-23 MED ORDER — LIDOCAINE HCL 1 % IJ SOLN
INTRAMUSCULAR | Status: AC
  Filled 2019-05-23: qty 20

## 2019-05-23 MED ORDER — FENTANYL CITRATE (PF) 100 MCG/2ML IJ SOLN
INTRAMUSCULAR | Status: AC
  Filled 2019-05-23: qty 2

## 2019-05-23 MED ORDER — HYDROMORPHONE HCL 1 MG/ML IJ SOLN
INTRAMUSCULAR | Status: AC | PRN
  Administered 2019-05-23: 0.5 mg via INTRAVENOUS

## 2019-05-23 MED ORDER — HEPARIN SODIUM (PORCINE) 5000 UNIT/ML IJ SOLN
5000.0000 [IU] | Freq: Three times a day (TID) | INTRAMUSCULAR | Status: DC
  Administered 2019-05-23 – 2019-05-28 (×14): 5000 [IU] via SUBCUTANEOUS
  Filled 2019-05-23 (×14): qty 1

## 2019-05-23 MED ORDER — METRONIDAZOLE IN NACL 5-0.79 MG/ML-% IV SOLN
500.0000 mg | Freq: Three times a day (TID) | INTRAVENOUS | Status: DC
  Administered 2019-05-23 – 2019-05-27 (×12): 500 mg via INTRAVENOUS
  Filled 2019-05-23 (×12): qty 100

## 2019-05-23 MED ORDER — ONDANSETRON HCL 4 MG PO TABS: 4.0000 mg | ORAL_TABLET | Freq: Four times a day (QID) | ORAL | Status: DC | PRN

## 2019-05-23 MED ORDER — POTASSIUM CHLORIDE 10 MEQ/100ML IV SOLN: 10.0000 meq | INTRAVENOUS | Status: DC

## 2019-05-23 MED ORDER — IOHEXOL 300 MG/ML  SOLN
50.0000 mL | Freq: Once | INTRAMUSCULAR | Status: AC | PRN
  Administered 2019-05-23: 14:00:00 5 mL

## 2019-05-23 MED ORDER — POTASSIUM CHLORIDE 10 MEQ/100ML IV SOLN
INTRAVENOUS | Status: AC
  Administered 2019-05-23: 12:00:00 10 meq via INTRAVENOUS
  Filled 2019-05-23: qty 100

## 2019-05-23 MED ORDER — SODIUM CHLORIDE 0.9 % IV SOLN
2.0000 g | Freq: Once | INTRAVENOUS | Status: AC
  Administered 2019-05-23: 03:00:00 2 g via INTRAVENOUS
  Filled 2019-05-23: qty 2

## 2019-05-23 MED ORDER — ALBUTEROL SULFATE (2.5 MG/3ML) 0.083% IN NEBU: 2.5000 mg | INHALATION_SOLUTION | Freq: Four times a day (QID) | RESPIRATORY_TRACT | Status: DC | PRN

## 2019-05-23 MED ORDER — SODIUM CHLORIDE 0.9% FLUSH
5.0000 mL | Freq: Three times a day (TID) | INTRAVENOUS | Status: DC
  Administered 2019-05-23 – 2019-05-28 (×13): 5 mL

## 2019-05-23 MED ORDER — ACETAMINOPHEN 325 MG PO TABS: 650.0000 mg | ORAL_TABLET | Freq: Four times a day (QID) | ORAL | Status: DC | PRN

## 2019-05-23 MED ORDER — SODIUM CHLORIDE 0.9 % IV SOLN
2.0000 g | Freq: Two times a day (BID) | INTRAVENOUS | Status: DC
  Administered 2019-05-23: 11:00:00 2 g via INTRAVENOUS
  Filled 2019-05-23: qty 2

## 2019-05-23 MED ORDER — VANCOMYCIN HCL 750 MG/150ML IV SOLN
750.0000 mg | INTRAVENOUS | Status: DC
  Filled 2019-05-23: qty 150

## 2019-05-23 MED ORDER — FENTANYL CITRATE (PF) 100 MCG/2ML IJ SOLN
INTRAMUSCULAR | Status: AC | PRN
  Administered 2019-05-23: 50 ug via INTRAVENOUS

## 2019-05-23 MED ORDER — VANCOMYCIN HCL IN DEXTROSE 1-5 GM/200ML-% IV SOLN
1000.0000 mg | Freq: Once | INTRAVENOUS | Status: AC
  Administered 2019-05-23: 1000 mg via INTRAVENOUS
  Filled 2019-05-23: qty 200

## 2019-05-23 MED ORDER — ACETAMINOPHEN 650 MG RE SUPP: 650.0000 mg | Freq: Four times a day (QID) | RECTAL | Status: DC | PRN

## 2019-05-23 MED ORDER — HYDROMORPHONE HCL 1 MG/ML IJ SOLN
INTRAMUSCULAR | Status: AC
  Filled 2019-05-23: qty 1

## 2019-05-23 MED ORDER — HEPARIN SODIUM (PORCINE) 5000 UNIT/ML IJ SOLN
5000.0000 [IU] | Freq: Three times a day (TID) | INTRAMUSCULAR | Status: DC
  Filled 2019-05-23: qty 1

## 2019-05-23 MED ORDER — SODIUM CHLORIDE 0.9 % IV BOLUS
30.0000 mL/kg | Freq: Once | INTRAVENOUS | Status: AC
  Administered 2019-05-23: 06:00:00 2730 mL via INTRAVENOUS

## 2019-05-23 MED ORDER — SODIUM CHLORIDE 0.9% FLUSH
3.0000 mL | Freq: Two times a day (BID) | INTRAVENOUS | Status: DC
  Administered 2019-05-23 – 2019-05-28 (×5): 3 mL via INTRAVENOUS

## 2019-05-23 MED ORDER — SODIUM CHLORIDE 0.9 % IV SOLN
2.0000 g | INTRAVENOUS | Status: DC
  Administered 2019-05-23 – 2019-05-27 (×5): 2 g via INTRAVENOUS
  Filled 2019-05-23 (×5): qty 20

## 2019-05-23 MED ORDER — ONDANSETRON HCL 4 MG/2ML IJ SOLN: 4.0000 mg | Freq: Four times a day (QID) | INTRAMUSCULAR | Status: DC | PRN

## 2019-05-23 MED ORDER — SODIUM CHLORIDE 0.9 % IV SOLN
INTRAVENOUS | Status: DC
  Administered 2019-05-26: 15:00:00 1000 mL via INTRAVENOUS

## 2019-05-23 MED ORDER — NALOXONE HCL 2 MG/2ML IJ SOSY
1.0000 mg | PREFILLED_SYRINGE | Freq: Once | INTRAMUSCULAR | Status: AC
  Administered 2019-05-23: 05:00:00 1 mg via INTRAVENOUS
  Filled 2019-05-23: qty 2

## 2019-05-23 MED ORDER — ACETAMINOPHEN 650 MG RE SUPP
650.0000 mg | Freq: Once | RECTAL | Status: AC
  Administered 2019-05-23: 05:00:00 650 mg via RECTAL
  Filled 2019-05-23: qty 1

## 2019-05-23 MED ORDER — MIDAZOLAM HCL 2 MG/2ML IJ SOLN
INTRAMUSCULAR | Status: AC
  Filled 2019-05-23: qty 2

## 2019-05-23 MED ORDER — LIDOCAINE HCL (PF) 1 % IJ SOLN
INTRAMUSCULAR | Status: AC | PRN
  Administered 2019-05-23: 10 mL

## 2019-05-23 NOTE — H&P (Addendum)
History and Physical    Gabriella Butler [id number] DOB: 13-Dec-1945 DOA: 05/23/2019  Referring MD/NP/PA: Dwyane Dee, MD PCP: Patient, No Pcp Per  Patient coming from: Home via EMS  Chief Complaint: Abdominal pain  I have personally briefly reviewed patient's old medical records in [place]   HPI: Gabriella Butler is a 74 y.o. female with medical history significant of hypertension, asthma with bronchitis, gout, cholelithiasis, and gallstone pancreatitis presents with complaints of abdominal pain over the last couple days.  History is obtained mostly from the patient's daughter who is at bedside and lives with her at home.  She described the abdominal pain is located all over with radiation to her back.  Patient admitted that abdominal pain worsened after eating.  Associated symptoms include fever, generalized malaise, nausea, vomiting, mild intermittently productive cough, short and lethargy.    Patient last was hospitalized back in 12/2017 with complaints of abdominal pain found to cholelithiasis with gallstone pancreatitis.  At that time ERCP was recommended by GI as well as surgical intervention by general surgery, but patient refused both.  Gabriella Butler reports that she was scared of having any kind of procedure.  Ultimately she was placed on empiric antibiotics and recommended to follow-up in the outpatient setting, but never followed up.  En route with EMS patient was noted to have a blood pressure of 77/48, heart rate 140, respirations 93% on room air, and CBG, 202.   ED Course: Upon admission into the emergency department patient was seen to be febrile up to 103.3 F, pulse 113-138, respirations up to 37, blood pressure as low as 74/50 improved with IV fluids to 108/68, and O2 saturations maintained on room air.  Labs significant for WBC 10.2 with left shift, potassium 3.3, CO2 17, BUN 24, creatinine 1.77, glucose 186, magnesium 1.6, lactic acid 3.8-> 3.6.  Chest x-ray significant for  lingular atelectasis.  CT scan of the abdomen and pelvis revealed diffuse inflammatory changes concerning for acute cholecystitis with signs of right lobe pneumonia versus aspiration.  General surgery was formally consulted.  Blood cultures were obtained.  COVID-19 and influenza screen were both negative.  Patient was given 40 mEq of potassium chloride IV, naloxone, metronidazole, vancomycin, and cefepime.    Review of Systems  Constitutional: Positive for fever and malaise/fatigue.  HENT: Negative for congestion and nosebleeds.   Eyes: Negative for photophobia and pain.  Respiratory: Positive for cough, sputum production and shortness of breath.   Cardiovascular: Negative for chest pain, leg swelling and PND.  Gastrointestinal: Positive for abdominal pain, nausea and vomiting.  Genitourinary: Negative for dysuria and hematuria.  Musculoskeletal: Positive for back pain.  Skin: Negative for rash.  Neurological: Positive for weakness. Negative for focal weakness and loss of consciousness.  Psychiatric/Behavioral: Negative for substance abuse. The patient is not nervous/anxious.     Past Medical History:  Diagnosis Date  . Asthma   . Bronchitis   . Gout   . Hypertension     History reviewed. No pertinent surgical history.   reports that she has never smoked. She has never used smokeless tobacco. She reports that she does not drink alcohol or use drugs.  No Known Allergies  Family History  Problem Relation Age of Onset  . Hypertension Father     Prior to Admission medications   Medication Sig Start Date End Date Taking? Authorizing Provider  albuterol (VENTOLIN HFA) 108 (90 Base) MCG/ACT inhaler Inhale 1-2 puffs into the lungs every 4 (four) hours as needed for  wheezing or shortness of breath. Patient not taking: Reported on 05/23/2019 04/23/19   Sherwood Gambler, MD  beclomethasone (QVAR) 40 MCG/ACT inhaler Inhale 1 puff into the lungs 2 (two) times daily. Patient not taking:  Reported on 04/22/2019 02/04/17   Dorie Rank, MD  Cetirizine HCl 10 MG CAPS Take 1 capsule (10 mg total) by mouth daily for 10 days. Patient not taking: Reported on 12/06/2017 07/12/17 07/22/17  Wieters, Hallie C, PA-C  ondansetron (ZOFRAN) 4 MG tablet Take 1 tablet (4 mg total) by mouth every 6 (six) hours as needed for nausea. Patient not taking: Reported on 04/22/2019 12/07/17   Raiford Noble Latif, DO  traMADol (ULTRAM) 50 MG tablet Take 1 tablet (50 mg total) by mouth every 6 (six) hours as needed. Patient not taking: Reported on 12/06/2017 02/04/17   Dorie Rank, MD    Physical Exam:  Constitutional: Lethargic elderly female who early responses to verbal commands. Vitals:   05/23/19 0600 05/23/19 0610 05/23/19 0615 05/23/19 0700  BP: (!) 83/60 99/68 103/66 108/68  Pulse: (!) 116 (!) 116    Resp: (!) 24 (!) 27 (!) 21 (!) 28  Temp:      TempSrc:      SpO2: 99% 98%    Weight:      Height:       Eyes: PERRL, lids and conjunctivae normal ENMT: Mucous membranes are moist. Posterior pharynx clear of any exudate or lesions.  Neck: normal, supple, no masses, no thyromegaly Respiratory: clear to auscultation bilaterally, no wheezing, no crackles. Normal respiratory effort. No accessory muscle use.  Cardiovascular: Regular rate and rhythm, no murmurs / rubs / gallops. No extremity edema. 2+ pedal pulses. No carotid bruits.  Abdomen: Mild abdominal tenderness noted in the right upper quadrant.  Bowel sounds present. Musculoskeletal: no clubbing / cyanosis. No joint deformity upper and lower extremities. Good ROM, no contractures. Normal muscle tone.  Skin: no rashes, lesions, ulcers. No induration Neurologic: CN 2-12 grossly intact. Sensation intact, DTR normal. Strength 5/5 in all 4.  Psychiatric: Normal judgment and insight. Alert and oriented x 3. Normal mood.     Labs on Admission: I have personally reviewed following labs and imaging studies  CBC: Recent Labs  Lab 05/23/19 0253  05/23/19 0433  WBC 10.2  --   NEUTROABS 9.1*  --   HGB 14.9 13.6  HCT 45.7 40.0  MCV 89.3  --   PLT 208  --    Basic Metabolic Panel: Recent Labs  Lab 05/23/19 0253 05/23/19 0433 05/23/19 0530  NA 135 138  --   K 3.3* 2.8*  --   CL 104  --   --   CO2 17*  --   --   GLUCOSE 186*  --   --   BUN 24*  --   --   CREATININE 1.77*  --   --   CALCIUM 9.4  --   --   MG  --   --  1.6*   GFR: Estimated Creatinine Clearance: 32 mL/min (A) (by C-G formula based on SCr of 1.77 mg/dL (H)). Liver Function Tests: Recent Labs  Lab 05/23/19 0253  AST 25  ALT 18  ALKPHOS 180*  BILITOT 1.1  PROT 7.3  ALBUMIN 3.0*   No results for input(s): LIPASE, AMYLASE in the last 168 hours. Recent Labs  Lab 05/23/19 0530  AMMONIA 26   Coagulation Profile: Recent Labs  Lab 05/23/19 0253  INR 1.2   Cardiac Enzymes: No results for  input(s): CKTOTAL, CKMB, CKMBINDEX, TROPONINI in the last 168 hours. BNP (last 3 results) No results for input(s): PROBNP in the last 8760 hours. HbA1C: No results for input(s): HGBA1C in the last 72 hours. CBG: Recent Labs  Lab 05/23/19 0256  GLUCAP 166*   Lipid Profile: No results for input(s): CHOL, HDL, LDLCALC, TRIG, CHOLHDL, LDLDIRECT in the last 72 hours. Thyroid Function Tests: No results for input(s): TSH, T4TOTAL, FREET4, T3FREE, THYROIDAB in the last 72 hours. Anemia Panel: No results for input(s): VITAMINB12, FOLATE, FERRITIN, TIBC, IRON, RETICCTPCT in the last 72 hours. Urine analysis:    Component Value Date/Time   COLORURINE YELLOW 05/23/2019 0347   APPEARANCEUR HAZY (A) 05/23/2019 0347   LABSPEC 1.021 05/23/2019 0347   PHURINE 6.0 05/23/2019 0347   GLUCOSEU 150 (A) 05/23/2019 0347   HGBUR SMALL (A) 05/23/2019 0347   BILIRUBINUR NEGATIVE 05/23/2019 0347   KETONESUR 5 (A) 05/23/2019 0347   PROTEINUR 100 (A) 05/23/2019 0347   NITRITE NEGATIVE 05/23/2019 0347   LEUKOCYTESUR NEGATIVE 05/23/2019 0347   Sepsis Labs: Recent Results  (from the past 240 hour(s))  Respiratory Panel by RT PCR (Flu A&B, Covid) - Nasopharyngeal Swab     Status: None   Collection Time: 05/23/19  3:19 AM   Specimen: Nasopharyngeal Swab  Result Value Ref Range Status   SARS Coronavirus 2 by RT PCR NEGATIVE NEGATIVE Final    Comment: (NOTE) SARS-CoV-2 target nucleic acids are NOT DETECTED. The SARS-CoV-2 RNA is generally detectable in upper respiratoy specimens during the acute phase of infection. The lowest concentration of SARS-CoV-2 viral copies this assay can detect is 131 copies/mL. A negative result does not preclude SARS-Cov-2 infection and should not be used as the sole basis for treatment or other patient management decisions. A negative result may occur with  improper specimen collection/handling, submission of specimen other than nasopharyngeal swab, presence of viral mutation(s) within the areas targeted by this assay, and inadequate number of viral copies (<131 copies/mL). A negative result must be combined with clinical observations, patient history, and epidemiological information. The expected result is Negative. Fact Sheet for Patients:  PinkCheek.be Fact Sheet for Healthcare Providers:  GravelBags.it This test is not yet ap proved or cleared by the [place] FDA and  has been authorized for detection and/or diagnosis of SARS-CoV-2 by FDA under an Emergency Use Authorization (EUA). This EUA will remain  in effect (meaning this test can be used) for the duration of the COVID-19 declaration under Section 564(b)(1) of the Act, 21 U.S.C. section 360bbb-3(b)(1), unless the authorization is terminated or revoked sooner.    Influenza A by PCR NEGATIVE NEGATIVE Final   Influenza B by PCR NEGATIVE NEGATIVE Final    Comment: (NOTE) The Xpert Xpress SARS-CoV-2/FLU/RSV assay is intended as an aid in  the diagnosis of influenza from Nasopharyngeal swab specimens and   should not be used as a sole basis for treatment. Nasal washings and  aspirates are unacceptable for Xpert Xpress SARS-CoV-2/FLU/RSV  testing. Fact Sheet for Patients: PinkCheek.be Fact Sheet for Healthcare Providers: GravelBags.it This test is not yet approved or cleared by the [place] FDA and  has been authorized for detection and/or diagnosis of SARS-CoV-2 by  FDA under an Emergency Use Authorization (EUA). This EUA will remain  in effect (meaning this test can be used) for the duration of the  Covid-19 declaration under Section 564(b)(1) of the Act, 21  U.S.C. section 360bbb-3(b)(1), unless the authorization is  terminated or revoked. Performed at [facility], [place] [address], [place],  [address]      Radiological Exams on Admission: DG Chest Port 1 View  Result Date: 05/23/2019 CLINICAL DATA:  Shortness of breath, sepsis EXAM: PORTABLE CHEST 1 VIEW COMPARISON:  04/22/2019 FINDINGS: Lingular atelectasis. Right lung clear. Heart is normal size. No effusions or acute bony abnormality. IMPRESSION: Lingular atelectasis. Electronically Signed   By: Rolm Baptise M.D.   On: 05/23/2019 03:20   CT Renal Stone Study  Result Date: 05/23/2019 CLINICAL DATA:  Malaise. Diffuse abdominal pain. EXAM: CT ABDOMEN AND PELVIS WITHOUT CONTRAST TECHNIQUE: Multidetector CT imaging of the abdomen and pelvis was performed following the standard protocol without IV contrast. COMPARISON:  Abdominal ultrasound 12/06/2017 FINDINGS: Lower chest: Asymmetric right lower lobe airspace disease is present. Minimal atelectasis is present at the left base. The heart size is normal. No significant pleural or pericardial effusion is present. Hepatobiliary: Liver is within normal limits. Numerous gallstones are present. Inflammatory changes are evident about the gallbladder with gallbladder wall thickening. Common bile duct is unremarkable.  Pancreas: Unremarkable. No pancreatic ductal dilatation or surrounding inflammatory changes. Spleen: Normal in size without focal abnormality. Adrenals/Urinary Tract: Adrenal glands are normal bilaterally. Kidneys and ureters are within normal limits. No stone or mass lesion is present. Urinary bladder is normal, collapsed. Stomach/Bowel: Inflammatory changes are noted in the gastric antrum and proximal duodenum. No discrete ulcer or abscess is present. Small bowel is within normal limits. Terminal ileum is normal. Inflammatory changes are present at the hepatic flexure. Transverse colon is normal. Descending and sigmoid colon are normal. Vascular/Lymphatic: Atherosclerotic changes are noted in the aorta branch vessels without aneurysm. Reproductive: Fibroid uterus is present. Uterus and adnexa are otherwise within limits. Other: No abdominal wall hernia or abnormality. No abdominopelvic ascites. Musculoskeletal: Degenerative changes are present the lumbar spine. Facet hypertrophy contributes to foraminal stenosis bilaterally at L5-S1. Vertebral body heights are maintained. No focal lytic or blastic lesions are present. Bony pelvis is within normal limits. The hips are located and within limits bilaterally. IMPRESSION: 1. Inflammatory changes about the gallbladder with gallbladder wall thickening and cholelithiasis compatible with acute cholecystitis. 2. Diffuse inflammatory changes are also present at the gastric antrum and duodenum. This may represent acute duodenitis versus cholecystitis. 3. Inflammatory changes at the hepatic flexure of the colon are likely secondary. 4. Asymmetric right lower lobe airspace disease concerning for pneumonia or aspiration. 5. Fibroid uterus. 6. Aortic Atherosclerosis (ICD10-I70.0). Electronically Signed   By: San Morelle M.D.   On: 05/23/2019 04:54    EKG: Independently reviewed.  Heparin  Assessment/Plan Sepsis secondary to cholelithiasis with acute cholecystitis:  Patient presented with fever up to 103.3 F, tachycardic, tachypneic, and hypotensive.  Labs significant for WBC 10.2 with signs of left shift and lactic acid elevated up to 3.8.  CT revealed gallbladder wall thickening and cholelithiasis concerning for acute cholecystitis.  General surgery have been formally consulted, but thereafter consulted IR for possible need of a percutaneous cholecystectomy drain. -Admit to a progressive bed -Follow-up blood cultures -N.p.o. -Continue empiric antibiotics of vancomycin, cefepime, and metronidazole -Pain control -Appreciate general surgery and IR consultative services  Right lower lobe pneumonia: Acute.  Patient reports having intermittent productive cough.  Question bacterial versus possibility of aspiration given recent episodes of nausea and vomiting.  -Aspiration precaution -Antibiotics as seen above  Acute kidney injury: Patient presents with creatinine elevated up to 1.77 with BUN 24.  Baseline previously noted to be 0.98 just 98-month ago. -Continue normal saline IV  fluids at 75 mL/h -Recheck BMP in a.m.  Hyperglycemia, history of prediabetes: Acute.  Glucose on admission elevated to 186.  Last hemoglobin A1c noted to be 6.2 in 12/2017. -Check hemoglobin A1c in a.m. -Consider placing on sliding scale insulin if needed  Hypokalemia: Acute.  Potassium initially 3.3 on admission.  Patient was given 40 mEq of potassium chloride IV in the ED. -Continue to monitor and replace as needed   Hypomagnesemia: Acute.  Magnesium 1.6. -Give 1 g of magnesium sulfate -Continue to monitor and replace as needed   Elevated alkaline phosphatase: Acute.  Alkaline phosphatase elevated 180. -Recheck CMP in a.m.  History of asthma: Patient currently not wheezing.  Appears to have not been taking Qvar or albuterol at home. -Albuterol as needed for shortness of breath/wheezing  Obesity: BMI 33.38 kg/m   DVT prophylaxis: Heparin Code Status: Full Family  Communication: Discussed plan of care with the patient's daughter was present at bedside Disposition Plan: Likely discharge home in 3-4 days  Consults called: Surgery and IR Admission status: Inpatient   Norval Morton MD [facility] Pager [phone]   If 7PM-7AM, please contact night-coverage [email or web address] Password [facility]  05/23/2019, 7:25 AM

## 2019-05-23 NOTE — ED Notes (Signed)
MD aware of pt's vitals and critical lactic.

## 2019-05-23 NOTE — Consult Note (Signed)
Reason for Consult:cholecystitis Referring Physician: April Polumbo  Gabriella Butler is an 74 y.o. female.  HPI: 74yo F brought to the emergency department by EMS complaining of malaise and generalized abdominal pain.  She reports she started feeling poorly last Friday and then developed some abdominal pain for the last day.  She is not a very good historian.  Her daughter, who she lives with, also helps with the history.  In the ED she was found to be hypotensive with signs of possible sepsis.  CT renal stone study demonstrated acute cholecystitis.  It also showed right lower lobe pneumonia.  She has acute kidney injury.  We are asked to consult regarding her cholecystitis.  Past Medical History:  Diagnosis Date  . Asthma   . Bronchitis   . Gout   . Hypertension     History reviewed. No pertinent surgical history.  Family History  Problem Relation Age of Onset  . Hypertension Father     Social History:  reports that she has never smoked. She has never used smokeless tobacco. She reports that she does not drink alcohol or use drugs.  Allergies: No Known Allergies  Medications:  Scheduled: . heparin  5,000 Units Subcutaneous Q8H  . sodium chloride flush  3 mL Intravenous Q12H    Results for orders placed or performed during the hospital encounter of 05/23/19 (from the past 48 hour(s))  Lactic acid, plasma     Status: Abnormal   Collection Time: 05/23/19  2:53 AM  Result Value Ref Range   Lactic Acid, Venous 3.8 (HH) 0.5 - 1.9 mmol/L    Comment: CRITICAL RESULT CALLED TO, READ BACK BY AND VERIFIED WITH: CAMPBELL T,RN 05/23/19 [id number] Performed at [facility], [place] [address] [place], [place] [address]   Comprehensive metabolic panel     Status: Abnormal   Collection Time: 05/23/19  2:53 AM  Result Value Ref Range   Sodium 135 135 - 145 mmol/L   Potassium 3.3 (L) 3.5 - 5.1 mmol/L   Chloride 104 98 - 111 mmol/L   CO2 17 (L) 22 - 32 mmol/L   Glucose, Bld 186 (H) 70 -  99 mg/dL    Comment: Glucose reference range applies only to samples taken after fasting for at least 8 hours.   BUN 24 (H) 8 - 23 mg/dL   Creatinine, Ser 1.77 (H) 0.44 - 1.00 mg/dL   Calcium 9.4 8.9 - 10.3 mg/dL   Total Protein 7.3 6.5 - 8.1 g/dL   Albumin 3.0 (L) 3.5 - 5.0 g/dL   AST 25 15 - 41 U/L   ALT 18 0 - 44 U/L   Alkaline Phosphatase 180 (H) 38 - 126 U/L   Total Bilirubin 1.1 0.3 - 1.2 mg/dL   GFR calc non Af Amer 28 (L) >60 mL/min   GFR calc Af Amer 33 (L) >60 mL/min   Anion gap 14 5 - 15    Comment: Performed at [facility] [address], [place], [place] [address]  CBC WITH DIFFERENTIAL     Status: Abnormal   Collection Time: 05/23/19  2:53 AM  Result Value Ref Range   WBC 10.2 4.0 - 10.5 K/uL   RBC 5.12 (H) 3.87 - 5.11 MIL/uL   Hemoglobin 14.9 12.0 - 15.0 g/dL   HCT 45.7 36.0 - 46.0 %   MCV 89.3 80.0 - 100.0 fL   MCH 29.1 26.0 - 34.0 pg   MCHC 32.6 30.0 - 36.0 g/dL  RDW 13.6 11.5 - 15.5 %   Platelets 208 150 - 400 K/uL   nRBC 0.0 0.0 - 0.2 %   Neutrophils Relative % 88 %   Neutro Abs 9.1 (H) 1.7 - 7.7 K/uL   Lymphocytes Relative 8 %   Lymphs Abs 0.8 0.7 - 4.0 K/uL   Monocytes Relative 1 %   Monocytes Absolute 0.1 0.1 - 1.0 K/uL   Eosinophils Relative 0 %   Eosinophils Absolute 0.0 0.0 - 0.5 K/uL   Basophils Relative 1 %   Basophils Absolute 0.1 0.0 - 0.1 K/uL   WBC Morphology VACUOLATED NEUTROPHILS    Immature Granulocytes 2 %   Abs Immature Granulocytes 0.15 (H) 0.00 - 0.07 K/uL    Comment: Performed at [facility] [address] [place], [place] [address]  APTT     Status: None   Collection Time: 05/23/19  2:53 AM  Result Value Ref Range   aPTT 28 24 - 36 seconds    Comment: Performed at [facility] [address] [place], [place] [address]  Protime-INR     Status: Abnormal   Collection Time: 05/23/19  2:53 AM  Result Value Ref Range   Prothrombin Time 15.5 (H) 11.4 - 15.2 seconds   INR 1.2 0.8 - 1.2    Comment:  (NOTE) INR goal varies based on device and disease states. Performed at [facility], [place] [address] [place], [place] [address]   CBG monitoring, ED     Status: Abnormal   Collection Time: 05/23/19  2:56 AM  Result Value Ref Range   Glucose-Capillary 166 (H) 70 - 99 mg/dL    Comment: Glucose reference range applies only to samples taken after fasting for at least 8 hours.  Respiratory Panel by RT PCR (Flu A&B, Covid) - Nasopharyngeal Swab     Status: None   Collection Time: 05/23/19  3:19 AM   Specimen: Nasopharyngeal Swab  Result Value Ref Range   SARS Coronavirus 2 by RT PCR NEGATIVE NEGATIVE    Comment: (NOTE) SARS-CoV-2 target nucleic acids are NOT DETECTED. The SARS-CoV-2 RNA is generally detectable in upper respiratoy specimens during the acute phase of infection. The lowest concentration of SARS-CoV-2 viral copies this assay can detect is 131 copies/mL. A negative result does not preclude SARS-Cov-2 infection and should not be used as the sole basis for treatment or other patient management decisions. A negative result may occur with  improper specimen collection/handling, submission of specimen other than nasopharyngeal swab, presence of viral mutation(s) within the areas targeted by this assay, and inadequate number of viral copies (<131 copies/mL). A negative result must be combined with clinical observations, patient history, and epidemiological information. The expected result is Negative. Fact Sheet for Patients:  PinkCheek.be Fact Sheet for Healthcare Providers:  GravelBags.it This test is not yet ap proved or cleared by the [place] FDA and  has been authorized for detection and/or diagnosis of SARS-CoV-2 by FDA under an Emergency Use Authorization (EUA). This EUA will remain  in effect (meaning this test can be used) for the duration of the COVID-19 declaration under Section 564(b)(1) of the Act,  21 U.S.C. section 360bbb-3(b)(1), unless the authorization is terminated or revoked sooner.    Influenza A by PCR NEGATIVE NEGATIVE   Influenza B by PCR NEGATIVE NEGATIVE    Comment: (NOTE) The Xpert Xpress SARS-CoV-2/FLU/RSV assay is intended as an aid in  the diagnosis of influenza from Nasopharyngeal swab specimens and  should not be used as a sole basis for treatment. Nasal washings and  aspirates are unacceptable for Xpert Xpress SARS-CoV-2/FLU/RSV  testing. Fact Sheet for Patients: PinkCheek.be Fact Sheet for Healthcare Providers: GravelBags.it This test is not yet approved or cleared by the [place] FDA and  has been authorized for detection and/or diagnosis of SARS-CoV-2 by  FDA under an Emergency Use Authorization (EUA). This EUA will remain  in effect (meaning this test can be used) for the duration of the  Covid-19 declaration under Section 564(b)(1) of the Act, 21  U.S.C. section 360bbb-3(b)(1), unless the authorization is  terminated or revoked. Performed at [place] [facility], [place] [address], [place], [place] [address]   Urinalysis, Routine w reflex microscopic     Status: Abnormal   Collection Time: 05/23/19  3:47 AM  Result Value Ref Range   Color, Urine YELLOW YELLOW   APPearance HAZY (A) CLEAR   Specific Gravity, Urine 1.021 1.005 - 1.030   pH 6.0 5.0 - 8.0   Glucose, UA 150 (A) NEGATIVE mg/dL   Hgb urine dipstick SMALL (A) NEGATIVE   Bilirubin Urine NEGATIVE NEGATIVE   Ketones, ur 5 (A) NEGATIVE mg/dL   Protein, ur 100 (A) NEGATIVE mg/dL   Nitrite NEGATIVE NEGATIVE   Leukocytes,Ua NEGATIVE NEGATIVE   RBC / HPF 6-10 0 - 5 RBC/hpf   WBC, UA 6-10 0 - 5 WBC/hpf   Bacteria, UA NONE SEEN NONE SEEN   Squamous Epithelial / LPF 0-5 0 - 5   Mucus PRESENT     Comment: Performed at [facility], [place] [address] [place], [place] [address]  I-STAT 7, (LYTES, BLD GAS, ICA, H+H)     Status: Abnormal    Collection Time: 05/23/19  4:33 AM  Result Value Ref Range   pH, Arterial 7.370 7.350 - 7.450   pCO2 arterial 32.7 32.0 - 48.0 mmHg   pO2, Arterial 89 83.0 - 108.0 mmHg   Bicarbonate 18.4 (L) 20.0 - 28.0 mmol/L   TCO2 19 (L) 22 - 32 mmol/L   O2 Saturation 95.0 %   Acid-base deficit 5.0 (H) 0.0 - 2.0 mmol/L   Sodium 138 135 - 145 mmol/L   Potassium 2.8 (L) 3.5 - 5.1 mmol/L   Calcium, Ion 1.26 1.15 - 1.40 mmol/L   HCT 40.0 36.0 - 46.0 %   Hemoglobin 13.6 12.0 - 15.0 g/dL   Patient temperature 103.3 F    Collection site Radial    Drawn by Operator    Sample type ARTERIAL   Lactic acid, plasma     Status: Abnormal   Collection Time: 05/23/19  5:30 AM  Result Value Ref Range   Lactic Acid, Venous 3.6 (HH) 0.5 - 1.9 mmol/L    Comment: CRITICAL VALUE NOTED.  VALUE IS CONSISTENT WITH PREVIOUSLY REPORTED AND CALLED VALUE. Performed at [facility], [place] [address], [place], [place] [address]   Ammonia     Status: None   Collection Time: 05/23/19  5:30 AM  Result Value Ref Range   Ammonia 26 9 - 35 umol/L    Comment: Performed at [facility], [place] [address] [place], [place] [address]  Magnesium     Status: Abnormal   Collection Time: 05/23/19  5:30 AM  Result Value Ref Range   Magnesium 1.6 (L) 1.7 - 2.4 mg/dL    Comment: Performed at [facility] [address], [place], [place] [address]    DG Chest [place]  1 View  Result Date: 05/23/2019 CLINICAL DATA:  Shortness of breath, sepsis EXAM: PORTABLE CHEST 1 VIEW COMPARISON:  04/22/2019 FINDINGS: Lingular atelectasis. Right lung clear. Heart is normal size. No effusions or acute bony abnormality. IMPRESSION: Lingular atelectasis. Electronically Signed   By: Rolm Baptise M.D.   On: 05/23/2019 03:20   CT Renal Stone Study  Result Date: 05/23/2019 CLINICAL DATA:  Malaise. Diffuse abdominal pain. EXAM: CT ABDOMEN AND PELVIS WITHOUT CONTRAST TECHNIQUE: Multidetector CT imaging of the abdomen and pelvis was performed  following the standard protocol without IV contrast. COMPARISON:  Abdominal ultrasound 12/06/2017 FINDINGS: Lower chest: Asymmetric right lower lobe airspace disease is present. Minimal atelectasis is present at the left base. The heart size is normal. No significant pleural or pericardial effusion is present. Hepatobiliary: Liver is within normal limits. Numerous gallstones are present. Inflammatory changes are evident about the gallbladder with gallbladder wall thickening. Common bile duct is unremarkable. Pancreas: Unremarkable. No pancreatic ductal dilatation or surrounding inflammatory changes. Spleen: Normal in size without focal abnormality. Adrenals/Urinary Tract: Adrenal glands are normal bilaterally. Kidneys and ureters are within normal limits. No stone or mass lesion is present. Urinary bladder is normal, collapsed. Stomach/Bowel: Inflammatory changes are noted in the gastric antrum and proximal duodenum. No discrete ulcer or abscess is present. Small bowel is within normal limits. Terminal ileum is normal. Inflammatory changes are present at the hepatic flexure. Transverse colon is normal. Descending and sigmoid colon are normal. Vascular/Lymphatic: Atherosclerotic changes are noted in the aorta branch vessels without aneurysm. Reproductive: Fibroid uterus is present. Uterus and adnexa are otherwise within limits. Other: No abdominal wall hernia or abnormality. No abdominopelvic ascites. Musculoskeletal: Degenerative changes are present the lumbar spine. Facet hypertrophy contributes to foraminal stenosis bilaterally at L5-S1. Vertebral body heights are maintained. No focal lytic or blastic lesions are present. Bony pelvis is within normal limits. The hips are located and within limits bilaterally. IMPRESSION: 1. Inflammatory changes about the gallbladder with gallbladder wall thickening and cholelithiasis compatible with acute cholecystitis. 2. Diffuse inflammatory changes are also present at the  gastric antrum and duodenum. This may represent acute duodenitis versus cholecystitis. 3. Inflammatory changes at the hepatic flexure of the colon are likely secondary. 4. Asymmetric right lower lobe airspace disease concerning for pneumonia or aspiration. 5. Fibroid uterus. 6. Aortic Atherosclerosis (ICD10-I70.0). Electronically Signed   By: San Morelle M.D.   On: 05/23/2019 04:54    Review of Systems  Constitutional: Positive for appetite change.  HENT: Negative.   Eyes: Negative.   Respiratory: Positive for cough and shortness of breath. Negative for wheezing.   Cardiovascular: Negative for chest pain.  Gastrointestinal: Positive for abdominal pain. Negative for diarrhea and nausea.  Endocrine: Negative.   Genitourinary: Negative.   Musculoskeletal: Negative.   Skin: Negative.   Allergic/Immunologic: Negative.   Neurological: Negative.   Hematological: Negative.   Psychiatric/Behavioral: Negative.    Blood pressure 108/68, pulse (!) 116, temperature 98.1 F (36.7 C), temperature source Oral, resp. rate (!) 28, height 5\' 5"  (1.651 m), weight 91 kg, SpO2 98 %. Physical Exam  Constitutional: She appears well-developed and well-nourished. No distress.  HENT:  Head: Normocephalic.  Right Ear: External ear normal.  Left Ear: External ear normal.  Mouth/Throat: Oropharynx is clear and moist.  Eyes: Pupils are equal, round, and reactive to light. EOM are normal. Right eye exhibits no discharge. Left eye exhibits no discharge. No scleral icterus.  Neck: No thyromegaly present.  Cardiovascular: Regular rhythm and normal heart sounds.  Heart rate 115  Respiratory: Effort normal. She has no wheezes. She has no rales.  Respiratory rate somewhat increased, minimal rhonchi  GI: Soft. She exhibits no distension. There is abdominal tenderness. There is guarding. There is no rebound.  Tender right upper quadrant with voluntary guarding, no hepatosplenomegaly  Musculoskeletal:      Cervical back: Neck supple.     Comments: Mild lower extremity edema  Neurological: She is alert.  Slow to answer some questions  Skin: Skin is warm and dry.  Psychiatric: She has a normal mood and affect.  A&O x3    Assessment/Plan: Acute cholecystitis - also with acute kidney injury and right lower lobe pneumonia.  She is a poor surgical candidate at this time due to these issues.  Recommend bowel rest, IV antibiotics (on Vanc and Maxipime), Interventional Radiology to place percutaneous cholecystostomy tube.  I have ordered this and I discussed the plan in detail with her and her daughter.  I answered their questions.  Zenovia Jarred 05/23/2019, 7:44 AM

## 2019-05-23 NOTE — ED Notes (Addendum)
Pt arrived back from IR.

## 2019-05-23 NOTE — ED Provider Notes (Signed)
[facility] EMERGENCY DEPARTMENT Provider Note   CSN: [id number] Arrival date & time: 05/23/19  0246     History Chief Complaint  Patient presents with  . Tachycardia  . Hypotension  . Lethargic    Francille Burchell is a 74 y.o. female.  The history is provided by the EMS personnel. The history is limited by the condition of the patient.  Illness Location:  At home  Quality:  Lethargy, tacycardia and hypotension Severity:  Severe Onset quality:  Gradual Duration:  1 day Timing:  Constant Progression:  Worsening Chronicity:  New Context:  Elderly with HTN  Relieved by:  Nothing Worsened by:  Time  Ineffective treatments:  None Associated symptoms: abdominal pain and fever   Associated symptoms: no cough, no loss of consciousness, no nausea, no sore throat and no wheezing   Risk factors:  Elderly      Past Medical History:  Diagnosis Date  . Asthma   . Bronchitis   . Gout   . Hypertension     Patient Active Problem List   Diagnosis Date Noted  . Elevated glucose level   . Acute gallstone pancreatitis 12/06/2017  . Acute cholecystitis 12/06/2017  . Essential hypertension 12/06/2017  . Asthma 12/06/2017  . Abnormal magnetic resonance cholangiopancreatography (MRCP)   . Choledocholithiasis     History reviewed. No pertinent surgical history.   OB History   No obstetric history on file.     Family History  Problem Relation Age of Onset  . Hypertension Father     Social History   Tobacco Use  . Smoking status: Never Smoker  . Smokeless tobacco: Never Used  Substance Use Topics  . Alcohol use: No  . Drug use: No    Home Medications Prior to Admission medications   Medication Sig Start Date End Date Taking? Authorizing Provider  albuterol (VENTOLIN HFA) 108 (90 Base) MCG/ACT inhaler Inhale 1-2 puffs into the lungs every 4 (four) hours as needed for wheezing or shortness of breath. 04/23/19   Sherwood Gambler, MD  beclomethasone  (QVAR) 40 MCG/ACT inhaler Inhale 1 puff into the lungs 2 (two) times daily. Patient not taking: Reported on 04/22/2019 02/04/17   Dorie Rank, MD  Cetirizine HCl 10 MG CAPS Take 1 capsule (10 mg total) by mouth daily for 10 days. Patient not taking: Reported on 12/06/2017 07/12/17 07/22/17  Wieters, Hallie C, PA-C  ondansetron (ZOFRAN) 4 MG tablet Take 1 tablet (4 mg total) by mouth every 6 (six) hours as needed for nausea. Patient not taking: Reported on 04/22/2019 12/07/17   Raiford Noble Latif, DO  traMADol (ULTRAM) 50 MG tablet Take 1 tablet (50 mg total) by mouth every 6 (six) hours as needed. Patient not taking: Reported on 12/06/2017 02/04/17   Dorie Rank, MD    Allergies    Patient has no known allergies.  Review of Systems   Review of Systems  Unable to perform ROS: Acuity of condition  Constitutional: Positive for fever.  HENT: Negative for sore throat.   Respiratory: Negative for cough and wheezing.   Gastrointestinal: Positive for abdominal pain. Negative for nausea.  Neurological: Negative for loss of consciousness.    Physical Exam Updated Vital Signs BP 90/61   Pulse (!) 121   Temp (!) 103.3 F (39.6 C) (Rectal)   Resp (!) 8   Ht 5\' 5"  (1.651 m)   Wt 91 kg   SpO2 97%   BMI 33.38 kg/m   Physical Exam Vitals and  nursing note reviewed.  Constitutional:      Appearance: She is not diaphoretic.  HENT:     Head: Normocephalic and atraumatic.     Right Ear: Tympanic membrane normal.     Left Ear: Tympanic membrane normal.     Nose: Nose normal.  Eyes:     Conjunctiva/sclera: Conjunctivae normal.     Pupils: Pupils are equal, round, and reactive to light.  Cardiovascular:     Rate and Rhythm: Regular rhythm. Tachycardia present.     Pulses: Normal pulses.     Heart sounds: Normal heart sounds.  Pulmonary:     Effort: Pulmonary effort is normal.     Breath sounds: Normal breath sounds.  Abdominal:     General: Bowel sounds are normal.     Palpations: Abdomen is  soft.     Tenderness: There is abdominal tenderness. There is no guarding or rebound.     Comments: Upper abdomen mildly diffusely tender  Musculoskeletal:        General: Normal range of motion.     Cervical back: Normal range of motion and neck supple. No rigidity.  Lymphadenopathy:     Cervical: No cervical adenopathy.  Skin:    General: Skin is warm and dry.     Capillary Refill: Capillary refill takes less than 2 seconds.     Findings: No rash.  Neurological:     General: No focal deficit present.     Mental Status: She is alert.     Deep Tendon Reflexes: Reflexes normal.  Psychiatric:        Thought Content: Thought content normal.     ED Results / Procedures / Treatments   Labs (all labs ordered are listed, but only abnormal results are displayed) Results for orders placed or performed during the hospital encounter of 05/23/19  Respiratory Panel by RT PCR (Flu A&B, Covid) - Nasopharyngeal Swab   Specimen: Nasopharyngeal Swab  Result Value Ref Range   SARS Coronavirus 2 by RT PCR NEGATIVE NEGATIVE   Influenza A by PCR NEGATIVE NEGATIVE   Influenza B by PCR NEGATIVE NEGATIVE  Lactic acid, plasma  Result Value Ref Range   Lactic Acid, Venous 3.8 (HH) 0.5 - 1.9 mmol/L  Comprehensive metabolic panel  Result Value Ref Range   Sodium 135 135 - 145 mmol/L   Potassium 3.3 (L) 3.5 - 5.1 mmol/L   Chloride 104 98 - 111 mmol/L   CO2 17 (L) 22 - 32 mmol/L   Glucose, Bld 186 (H) 70 - 99 mg/dL   BUN 24 (H) 8 - 23 mg/dL   Creatinine, Ser 1.77 (H) 0.44 - 1.00 mg/dL   Calcium 9.4 8.9 - 10.3 mg/dL   Total Protein 7.3 6.5 - 8.1 g/dL   Albumin 3.0 (L) 3.5 - 5.0 g/dL   AST 25 15 - 41 U/L   ALT 18 0 - 44 U/L   Alkaline Phosphatase 180 (H) 38 - 126 U/L   Total Bilirubin 1.1 0.3 - 1.2 mg/dL   GFR calc non Af Amer 28 (L) >60 mL/min   GFR calc Af Amer 33 (L) >60 mL/min   Anion gap 14 5 - 15  CBC WITH DIFFERENTIAL  Result Value Ref Range   WBC 10.2 4.0 - 10.5 K/uL   RBC 5.12 (H)  3.87 - 5.11 MIL/uL   Hemoglobin 14.9 12.0 - 15.0 g/dL   HCT 45.7 36.0 - 46.0 %   MCV 89.3 80.0 - 100.0 fL   MCH 29.1  26.0 - 34.0 pg   MCHC 32.6 30.0 - 36.0 g/dL   RDW 13.6 11.5 - 15.5 %   Platelets 208 150 - 400 K/uL   nRBC 0.0 0.0 - 0.2 %   Neutrophils Relative % 88 %   Neutro Abs 9.1 (H) 1.7 - 7.7 K/uL   Lymphocytes Relative 8 %   Lymphs Abs 0.8 0.7 - 4.0 K/uL   Monocytes Relative 1 %   Monocytes Absolute 0.1 0.1 - 1.0 K/uL   Eosinophils Relative 0 %   Eosinophils Absolute 0.0 0.0 - 0.5 K/uL   Basophils Relative 1 %   Basophils Absolute 0.1 0.0 - 0.1 K/uL   WBC Morphology VACUOLATED NEUTROPHILS    Immature Granulocytes 2 %   Abs Immature Granulocytes 0.15 (H) 0.00 - 0.07 K/uL  APTT  Result Value Ref Range   aPTT 28 24 - 36 seconds  Protime-INR  Result Value Ref Range   Prothrombin Time 15.5 (H) 11.4 - 15.2 seconds   INR 1.2 0.8 - 1.2  Urinalysis, Routine w reflex microscopic  Result Value Ref Range   Color, Urine YELLOW YELLOW   APPearance HAZY (A) CLEAR   Specific Gravity, Urine 1.021 1.005 - 1.030   pH 6.0 5.0 - 8.0   Glucose, UA 150 (A) NEGATIVE mg/dL   Hgb urine dipstick SMALL (A) NEGATIVE   Bilirubin Urine NEGATIVE NEGATIVE   Ketones, ur 5 (A) NEGATIVE mg/dL   Protein, ur 100 (A) NEGATIVE mg/dL   Nitrite NEGATIVE NEGATIVE   Leukocytes,Ua NEGATIVE NEGATIVE   RBC / HPF 6-10 0 - 5 RBC/hpf   WBC, UA 6-10 0 - 5 WBC/hpf   Bacteria, UA NONE SEEN NONE SEEN   Squamous Epithelial / LPF 0-5 0 - 5   Mucus PRESENT   CBG monitoring, ED  Result Value Ref Range   Glucose-Capillary 166 (H) 70 - 99 mg/dL  I-STAT 7, (LYTES, BLD GAS, ICA, H+H)  Result Value Ref Range   pH, Arterial 7.370 7.350 - 7.450   pCO2 arterial 32.7 32.0 - 48.0 mmHg   pO2, Arterial 89 83.0 - 108.0 mmHg   Bicarbonate 18.4 (L) 20.0 - 28.0 mmol/L   TCO2 19 (L) 22 - 32 mmol/L   O2 Saturation 95.0 %   Acid-base deficit 5.0 (H) 0.0 - 2.0 mmol/L   Sodium 138 135 - 145 mmol/L   Potassium 2.8 (L) 3.5 - 5.1  mmol/L   Calcium, Ion 1.26 1.15 - 1.40 mmol/L   HCT 40.0 36.0 - 46.0 %   Hemoglobin 13.6 12.0 - 15.0 g/dL   Patient temperature 103.3 F    Collection site Radial    Drawn by Operator    Sample type ARTERIAL    DG Chest Port 1 View  Result Date: 05/23/2019 CLINICAL DATA:  Shortness of breath, sepsis EXAM: PORTABLE CHEST 1 VIEW COMPARISON:  04/22/2019 FINDINGS: Lingular atelectasis. Right lung clear. Heart is normal size. No effusions or acute bony abnormality. IMPRESSION: Lingular atelectasis. Electronically Signed   By: Rolm Baptise M.D.   On: 05/23/2019 03:20   CT Renal Stone Study  Result Date: 05/23/2019 CLINICAL DATA:  Malaise. Diffuse abdominal pain. EXAM: CT ABDOMEN AND PELVIS WITHOUT CONTRAST TECHNIQUE: Multidetector CT imaging of the abdomen and pelvis was performed following the standard protocol without IV contrast. COMPARISON:  Abdominal ultrasound 12/06/2017 FINDINGS: Lower chest: Asymmetric right lower lobe airspace disease is present. Minimal atelectasis is present at the left base. The heart size is normal. No significant pleural or pericardial effusion  is present. Hepatobiliary: Liver is within normal limits. Numerous gallstones are present. Inflammatory changes are evident about the gallbladder with gallbladder wall thickening. Common bile duct is unremarkable. Pancreas: Unremarkable. No pancreatic ductal dilatation or surrounding inflammatory changes. Spleen: Normal in size without focal abnormality. Adrenals/Urinary Tract: Adrenal glands are normal bilaterally. Kidneys and ureters are within normal limits. No stone or mass lesion is present. Urinary bladder is normal, collapsed. Stomach/Bowel: Inflammatory changes are noted in the gastric antrum and proximal duodenum. No discrete ulcer or abscess is present. Small bowel is within normal limits. Terminal ileum is normal. Inflammatory changes are present at the hepatic flexure. Transverse colon is normal. Descending and sigmoid colon  are normal. Vascular/Lymphatic: Atherosclerotic changes are noted in the aorta branch vessels without aneurysm. Reproductive: Fibroid uterus is present. Uterus and adnexa are otherwise within limits. Other: No abdominal wall hernia or abnormality. No abdominopelvic ascites. Musculoskeletal: Degenerative changes are present the lumbar spine. Facet hypertrophy contributes to foraminal stenosis bilaterally at L5-S1. Vertebral body heights are maintained. No focal lytic or blastic lesions are present. Bony pelvis is within normal limits. The hips are located and within limits bilaterally. IMPRESSION: 1. Inflammatory changes about the gallbladder with gallbladder wall thickening and cholelithiasis compatible with acute cholecystitis. 2. Diffuse inflammatory changes are also present at the gastric antrum and duodenum. This may represent acute duodenitis versus cholecystitis. 3. Inflammatory changes at the hepatic flexure of the colon are likely secondary. 4. Asymmetric right lower lobe airspace disease concerning for pneumonia or aspiration. 5. Fibroid uterus. 6. Aortic Atherosclerosis (ICD10-I70.0). Electronically Signed   By: San Morelle M.D.   On: 05/23/2019 04:54    EKG EKG Interpretation  Date/Time:  Thursday Oneill Bais 22 2021 03:01:28 EDT Ventricular Rate:  127 PR Interval:    QRS Duration: 59 QT Interval:  315 QTC Calculation: 458 R Axis:   12 Text Interpretation: Sinus tachycardia Confirmed by Dory Horn) on 05/23/2019 4:06:33 AM   Radiology DG Chest Port 1 View  Result Date: 05/23/2019 CLINICAL DATA:  Shortness of breath, sepsis EXAM: PORTABLE CHEST 1 VIEW COMPARISON:  04/22/2019 FINDINGS: Lingular atelectasis. Right lung clear. Heart is normal size. No effusions or acute bony abnormality. IMPRESSION: Lingular atelectasis. Electronically Signed   By: Rolm Baptise M.D.   On: 05/23/2019 03:20    Procedures Procedures (including critical care time)  Medications Ordered in  ED Medications  sodium chloride 0.9 % bolus 2,730 mL (has no administration in time range)  potassium chloride 10 mEq in 100 mL IVPB (has no administration in time range)  ceFEPIme (MAXIPIME) 2 g in sodium chloride 0.9 % 100 mL IVPB (0 g Intravenous Stopped 05/23/19 0340)  metroNIDAZOLE (FLAGYL) IVPB 500 mg (0 mg Intravenous Stopped 05/23/19 0408)  vancomycin (VANCOCIN) IVPB 1000 mg/200 mL premix (0 mg Intravenous Stopped 05/23/19 0451)  sodium chloride 0.9 % bolus 1,000 mL (0 mLs Intravenous Stopped 05/23/19 0453)  acetaminophen (TYLENOL) suppository 650 mg (650 mg Rectal Given 05/23/19 0451)  naloxone (NARCAN) injection 1 mg (1 mg Intravenous Given 05/23/19 0451)    ED Course  I have reviewed the triage vital signs and the nursing notes.  Pertinent labs & imaging results that were available during my care of the patient were reviewed by me and considered in my medical decision making (see chart for details).    MDM Reviewed: previous chart and nursing note Interpretation: labs, ECG and x-ray (elevated lactate white count mildly elevated.  negative CXR by me ) Total time providing critical care: >  105 minutes (30 cfc/kg bolus and multiple antibiotics ). This excludes time spent performing separately reportable procedures and services. Consults: general surgery and admitting MD  CRITICAL CARE Performed by: Gelene Recktenwald K Druscilla Petsch-Rasch Total critical care time: 120 minutes Critical care time was exclusive of separately billable procedures and treating other patients. Critical care was necessary to treat or prevent imminent or life-threatening deterioration. Critical care was time spent personally by me on the following activities: development of treatment plan with patient and/or surrogate as well as nursing, discussions with consultants, evaluation of patient's response to treatment, examination of patient, obtaining history from patient or surrogate, ordering and performing treatments and  interventions, ordering and review of laboratory studies, ordering and review of radiographic studies, pulse oximetry and re-evaluation of patient's condition.  Final Clinical Impression(s) / ED Diagnoses  525 am case d/w Dr. Kieth Brightly.  CCS to follow along,  Will not intervene surgically at this time.   Mentation is markedly improved and BP is improving with intervention, will need admission to medicine service for multiple sites of infection.     Barbera Perritt, MD 05/23/19 [id number]

## 2019-05-23 NOTE — Consult Note (Signed)
Chief Complaint: Patient was seen in consultation today for percutaneous cholecystostomy Chief Complaint  Patient presents with  . Tachycardia  . Hypotension  . Lethargic    Referring Physician(s): Thompson,B  Supervising Physician: Corrie Mckusick  Patient Status: [facility] - ED  History of Present Illness: Gabriella Butler is a 74 y.o. female with past medical history of asthma, gout, hypertension who presented to [facility] ED earlier this morning with history of malaise and generalized abdominal pain as well as some intermittent nausea.  She was found to be hypotensive with signs of possible sepsis.  Subsequent imaging revealed:  1. Inflammatory changes about the gallbladder with gallbladder wall thickening and cholelithiasis compatible with acute cholecystitis. 2. Diffuse inflammatory changes are also present at the gastric antrum and duodenum. This may represent acute duodenitis versus cholecystitis. 3. Inflammatory changes at the hepatic flexure of the colon are likely secondary. 4. Asymmetric right lower lobe airspace disease concerning for pneumonia or aspiration. 5. Fibroid uterus. 6. Aortic Atherosclerosis    Current labs include WBC normal, hemoglobin 14.9, PT 15.5, INR 1.2, potassium 3.3, creatinine 1.77, COVID-19 negative, blood /urine cultures pending.  Patient was evaluated by surgery and deemed a poor surgical candidate for cholecystectomy at this time.  Request now received for percutaneous cholecystostomy.  Past Medical History:  Diagnosis Date  . Asthma   . Bronchitis   . Gout   . Hypertension     History reviewed. No pertinent surgical history.  Allergies: Patient has no known allergies.  Medications: Prior to Admission medications   Medication Sig Start Date End Date Taking? Authorizing Provider  albuterol (VENTOLIN HFA) 108 (90 Base) MCG/ACT inhaler Inhale 1-2 puffs into the lungs every 4 (four) hours as needed for wheezing or shortness of  breath. Patient not taking: Reported on 05/23/2019 04/23/19   Sherwood Gambler, MD  beclomethasone (QVAR) 40 MCG/ACT inhaler Inhale 1 puff into the lungs 2 (two) times daily. Patient not taking: Reported on 04/22/2019 02/04/17   Dorie Rank, MD  Cetirizine HCl 10 MG CAPS Take 1 capsule (10 mg total) by mouth daily for 10 days. Patient not taking: Reported on 12/06/2017 07/12/17 07/22/17  Wieters, Hallie C, PA-C  ondansetron (ZOFRAN) 4 MG tablet Take 1 tablet (4 mg total) by mouth every 6 (six) hours as needed for nausea. Patient not taking: Reported on 04/22/2019 12/07/17   Raiford Noble Latif, DO  traMADol (ULTRAM) 50 MG tablet Take 1 tablet (50 mg total) by mouth every 6 (six) hours as needed. Patient not taking: Reported on 12/06/2017 02/04/17   Dorie Rank, MD     Family History  Problem Relation Age of Onset  . Hypertension Father     Social History   Socioeconomic History  . Marital status: Single    Spouse name: Not on file  . Number of children: Not on file  . Years of education: Not on file  . Highest education level: Not on file  Occupational History  . Not on file  Tobacco Use  . Smoking status: Never Smoker  . Smokeless tobacco: Never Used  Substance and Sexual Activity  . Alcohol use: No  . Drug use: No  . Sexual activity: Not on file  Other Topics Concern  . Not on file  Social History Narrative  . Not on file   Social Determinants of Health   Financial Resource Strain:   . Difficulty of Paying Living Expenses:   Food Insecurity:   . Worried About Charity fundraiser in  the Last Year:   . [place] in the Last Year:   Transportation Needs:   . Film/video editor (Medical):   Marland Kitchen Lack of Transportation (Non-Medical):   Physical Activity:   . Days of Exercise per Week:   . Minutes of Exercise per Session:   Stress:   . Feeling of Stress :   Social Connections:   . Frequency of Communication with Friends and Family:   . Frequency of Social Gatherings with  Friends and Family:   . Attends Religious Services:   . Active Member of Clubs or Organizations:   . Attends Archivist Meetings:   Marland Kitchen Marital Status:      Review of Systems patient has reported some prior temperature elevations, abdominal pain, some intermittent nausea; denies headache, chest pain, worsening dyspnea or bleeding.  Vital Signs: BP 111/71   Pulse (!) 116   Temp 98.1 F (36.7 C) (Oral)   Resp (!) 28   Ht 5\' 5"  (1.651 m)   Wt 200 lb 9.9 oz (91 kg)   SpO2 98%   BMI 33.38 kg/m   Physical Exam patient awake/answering questions appropriately.  Daughter in room.  Chest with slightly diminished breath sounds right base, left clear.  Heart with tachycardic but regular rhythm.  Abdomen soft, positive bowel sounds, mildly tender right upper quadrant; trace pretibial edema.  Imaging: DG Chest Port 1 View  Result Date: 05/23/2019 CLINICAL DATA:  Shortness of breath, sepsis EXAM: PORTABLE CHEST 1 VIEW COMPARISON:  04/22/2019 FINDINGS: Lingular atelectasis. Right lung clear. Heart is normal size. No effusions or acute bony abnormality. IMPRESSION: Lingular atelectasis. Electronically Signed   By: Rolm Baptise M.D.   On: 05/23/2019 03:20   CT Renal Stone Study  Result Date: 05/23/2019 CLINICAL DATA:  Malaise. Diffuse abdominal pain. EXAM: CT ABDOMEN AND PELVIS WITHOUT CONTRAST TECHNIQUE: Multidetector CT imaging of the abdomen and pelvis was performed following the standard protocol without IV contrast. COMPARISON:  Abdominal ultrasound 12/06/2017 FINDINGS: Lower chest: Asymmetric right lower lobe airspace disease is present. Minimal atelectasis is present at the left base. The heart size is normal. No significant pleural or pericardial effusion is present. Hepatobiliary: Liver is within normal limits. Numerous gallstones are present. Inflammatory changes are evident about the gallbladder with gallbladder wall thickening. Common bile duct is unremarkable. Pancreas:  Unremarkable. No pancreatic ductal dilatation or surrounding inflammatory changes. Spleen: Normal in size without focal abnormality. Adrenals/Urinary Tract: Adrenal glands are normal bilaterally. Kidneys and ureters are within normal limits. No stone or mass lesion is present. Urinary bladder is normal, collapsed. Stomach/Bowel: Inflammatory changes are noted in the gastric antrum and proximal duodenum. No discrete ulcer or abscess is present. Small bowel is within normal limits. Terminal ileum is normal. Inflammatory changes are present at the hepatic flexure. Transverse colon is normal. Descending and sigmoid colon are normal. Vascular/Lymphatic: Atherosclerotic changes are noted in the aorta branch vessels without aneurysm. Reproductive: Fibroid uterus is present. Uterus and adnexa are otherwise within limits. Other: No abdominal wall hernia or abnormality. No abdominopelvic ascites. Musculoskeletal: Degenerative changes are present the lumbar spine. Facet hypertrophy contributes to foraminal stenosis bilaterally at L5-S1. Vertebral body heights are maintained. No focal lytic or blastic lesions are present. Bony pelvis is within normal limits. The hips are located and within limits bilaterally. IMPRESSION: 1. Inflammatory changes about the gallbladder with gallbladder wall thickening and cholelithiasis compatible with acute cholecystitis. 2. Diffuse inflammatory changes are also present at the gastric antrum and duodenum. This  may represent acute duodenitis versus cholecystitis. 3. Inflammatory changes at the hepatic flexure of the colon are likely secondary. 4. Asymmetric right lower lobe airspace disease concerning for pneumonia or aspiration. 5. Fibroid uterus. 6. Aortic Atherosclerosis (ICD10-I70.0). Electronically Signed   By: San Morelle M.D.   On: 05/23/2019 04:54    Labs:  CBC: Recent Labs    04/22/19 2157 05/23/19 0253 05/23/19 0433  WBC 6.1 10.2  --   HGB 13.6 14.9 13.6  HCT 43.9  45.7 40.0  PLT 254 208  --     COAGS: Recent Labs    05/23/19 0253  INR 1.2  APTT 28    BMP: Recent Labs    04/22/19 2157 05/23/19 0253 05/23/19 0433  NA 141 135 138  K 4.0 3.3* 2.8*  CL 108 104  --   CO2 26 17*  --   GLUCOSE 116* 186*  --   BUN 10 24*  --   CALCIUM 10.0 9.4  --   CREATININE 0.98 1.77*  --   GFRNONAA 58* 28*  --   GFRAA >60 33*  --     LIVER FUNCTION TESTS: Recent Labs    04/22/19 2157 05/23/19 0253  BILITOT 0.5 1.1  AST 20 25  ALT 18 18  ALKPHOS 61 180*  PROT 8.1 7.3  ALBUMIN 3.9 3.0*    TUMOR MARKERS: No results for input(s): AFPTM, CEA, CA199, CHROMGRNA in the last 8760 hours.  Assessment and Plan: 74 y.o. female with past medical history of asthma, gout, hypertension who presented to [facility] ED earlier this morning with history of malaise and generalized abdominal pain as well as some intermittent nausea.  She was found to be hypotensive with signs of possible sepsis.  Subsequent imaging revealed:  1. Inflammatory changes about the gallbladder with gallbladder wall thickening and cholelithiasis compatible with acute cholecystitis. 2. Diffuse inflammatory changes are also present at the gastric antrum and duodenum. This may represent acute duodenitis versus cholecystitis. 3. Inflammatory changes at the hepatic flexure of the colon are likely secondary. 4. Asymmetric right lower lobe airspace disease concerning for pneumonia or aspiration. 5. Fibroid uterus. 6. Aortic Atherosclerosis    Current labs include WBC normal, hemoglobin 14.9, PT 15.5, INR 1.2, potassium 3.3, creatinine 1.77, COVID-19 negative, blood /urine cultures pending.  Patient was evaluated by surgery and deemed a poor surgical candidate for cholecystectomy at this time.  Request now received for percutaneous cholecystostomy.  Imaging studies have been reviewed by Dr. Earleen Newport.  Details/risks of cholecystostomy tube placement, including but not limited to, internal  bleeding, infection, injury to adjacent structures, need for prolonged drainage discussed with patient and daughter with their understanding and consent.  Procedure scheduled for today.  Thank you for this interesting consult.  I greatly enjoyed meeting Betsie Heimberger and look forward to participating in their care.  A copy of this report was sent to the requesting provider on this date.  Electronically Signed: D. Rowe Robert, PA-C 05/23/2019, 9:48 AM   I spent a total of  30 minutes   in face to face in clinical consultation, greater than 50% of which was counseling/coordinating care for percutaneous cholecystostomy

## 2019-05-23 NOTE — Progress Notes (Signed)
Pharmacy Antibiotic Note  Gabriella Butler is a 74 y.o. female admitted on 05/23/2019 with malaise, abdominal pain.  Pharmacy has been consulted for Vancomycin/Cefepime dosing for r/o sepsis. WBC 10.2. Noted renal dysfunction. Lactic acid elevated.   Plan: Vancomycin 750 mg IV q24h >>Estimated AUC: 532 Cefepime 2g IV q12h Trend WBC, temp, renal function  F/U infectious work-up Drug levels as indicated   Height: 5\' 5"  (165.1 cm) Weight: 91 kg (200 lb 9.9 oz) IBW/kg (Calculated) : 57  Temp (24hrs), Avg:103.3 F (39.6 C), Min:103.3 F (39.6 C), Max:103.3 F (39.6 C)  Recent Labs  Lab 05/23/19 0253  WBC 10.2  CREATININE 1.77*  LATICACIDVEN 3.8*    Estimated Creatinine Clearance: 32 mL/min (A) (by C-G formula based on SCr of 1.77 mg/dL (H)).    No Known Allergies  Narda Bonds, PharmD, BCPS Clinical Pharmacist Phone: [phone]

## 2019-05-23 NOTE — Progress Notes (Signed)
PHARMACY - PHYSICIAN COMMUNICATION CRITICAL VALUE ALERT - BLOOD CULTURE IDENTIFICATION (BCID)  Gabriella Butler is an 74 y.o. female who presented to [facility] on 05/23/2019 with a chief complaint of acute cholecystitis s/p per tube placement with purulent foul smelling fluid  Assessment:  1 set of blood cx drawn and resulted with strep species and kleb oxytoca - no resistance detected   Name of physician (or Provider) Contacted: Ardith Dark NP  Current antibiotics: Cefepime Vanc  Changes to prescribed antibiotics recommended:  Dc above Ceftriaxone 2 g q24h F/u susceptibilities  Results for orders placed or performed during the hospital encounter of 05/23/19  Blood Culture ID Panel (Reflexed) (Collected: 05/23/2019  2:48 AM)  Result Value Ref Range   Enterococcus species NOT DETECTED NOT DETECTED   Listeria monocytogenes NOT DETECTED NOT DETECTED   Staphylococcus species NOT DETECTED NOT DETECTED   Staphylococcus aureus (BCID) NOT DETECTED NOT DETECTED   Streptococcus species DETECTED (A) NOT DETECTED   Streptococcus agalactiae NOT DETECTED NOT DETECTED   Streptococcus pneumoniae NOT DETECTED NOT DETECTED   Streptococcus pyogenes NOT DETECTED NOT DETECTED   Acinetobacter baumannii NOT DETECTED NOT DETECTED   Enterobacteriaceae species DETECTED (A) NOT DETECTED   Enterobacter cloacae complex NOT DETECTED NOT DETECTED   Escherichia coli NOT DETECTED NOT DETECTED   Klebsiella oxytoca DETECTED (A) NOT DETECTED   Klebsiella pneumoniae NOT DETECTED NOT DETECTED   Proteus species NOT DETECTED NOT DETECTED   Serratia marcescens NOT DETECTED NOT DETECTED   Carbapenem resistance NOT DETECTED NOT DETECTED   Haemophilus influenzae NOT DETECTED NOT DETECTED   Neisseria meningitidis NOT DETECTED NOT DETECTED   Pseudomonas aeruginosa NOT DETECTED NOT DETECTED   Candida albicans NOT DETECTED NOT DETECTED   Candida glabrata NOT DETECTED NOT DETECTED   Candida krusei NOT DETECTED NOT DETECTED   Candida parapsilosis NOT DETECTED NOT DETECTED   Candida tropicalis NOT DETECTED NOT DETECTED   Barth Kirks, PharmD, BCPS, BCCCP Clinical Pharmacist [phone]  Please check AMION for all [place] numbers  05/23/2019 9:36 PM

## 2019-05-23 NOTE — ED Notes (Signed)
Family at bedside. 

## 2019-05-23 NOTE — ED Triage Notes (Signed)
PT via [facility] from home, c/o malaise and diffuse abd pain x1 day. VS: BP 77/48. HR 140. SPO2 93% RA, RR 30, CBG 202. GCS 14, lethargic and oriented.

## 2019-05-23 NOTE — ED Notes (Addendum)
Pt transported to IR for drain insertion.

## 2019-05-23 NOTE — Procedures (Signed)
Interventional Radiology Procedure Note  Procedure: Successful placement of a transhepatic percutaneous cholecystostomy tube.    Complications: None  Estimated Blood Loss: None  Recommendations: - Bile frankly purulent and foul smelling.  Sent for culture.  - Drain to gravity - Flush drain once per shift   Signed,  Criselda Peaches, MD

## 2019-05-24 DIAGNOSIS — R7881 Bacteremia: Secondary | ICD-10-CM

## 2019-05-24 DIAGNOSIS — K819 Cholecystitis, unspecified: Secondary | ICD-10-CM

## 2019-05-24 LAB — COMPREHENSIVE METABOLIC PANEL
ALT: 26 U/L (ref 0–44)
AST: 31 U/L (ref 15–41)
Albumin: 2.3 g/dL — ABNORMAL LOW (ref 3.5–5.0)
Alkaline Phosphatase: 64 U/L (ref 38–126)
Anion gap: 11 (ref 5–15)
BUN: 17 mg/dL (ref 8–23)
CO2: 16 mmol/L — ABNORMAL LOW (ref 22–32)
Calcium: 8.3 mg/dL — ABNORMAL LOW (ref 8.9–10.3)
Chloride: 112 mmol/L — ABNORMAL HIGH (ref 98–111)
Creatinine, Ser: 1.12 mg/dL — ABNORMAL HIGH (ref 0.44–1.00)
GFR calc Af Amer: 57 mL/min — ABNORMAL LOW (ref >60)
GFR calc non Af Amer: 49 mL/min — ABNORMAL LOW (ref >60)
Glucose, Bld: 114 mg/dL — ABNORMAL HIGH (ref 70–99)
Potassium: 4.2 mmol/L (ref 3.5–5.1)
Sodium: 139 mmol/L (ref 135–145)
Total Bilirubin: 1.5 mg/dL — ABNORMAL HIGH (ref 0.3–1.2)
Total Protein: 6.3 g/dL — ABNORMAL LOW (ref 6.5–8.1)

## 2019-05-24 LAB — CBC
HCT: 41.9 % (ref 36.0–46.0)
Hemoglobin: 13.4 g/dL (ref 12.0–15.0)
MCH: 29.3 pg (ref 26.0–34.0)
MCHC: 32 g/dL (ref 30.0–36.0)
MCV: 91.5 fL (ref 80.0–100.0)
Platelets: 144 10*3/uL — ABNORMAL LOW (ref 150–400)
RBC: 4.58 MIL/uL (ref 3.87–5.11)
RDW: 14.5 % (ref 11.5–15.5)
WBC: 14.5 10*3/uL — ABNORMAL HIGH (ref 4.0–10.5)
nRBC: 0 % (ref 0.0–0.2)

## 2019-05-24 LAB — URINE CULTURE: Culture: NO GROWTH

## 2019-05-24 LAB — HEMOGLOBIN A1C
Hgb A1c MFr Bld: 6.6 % — ABNORMAL HIGH (ref 4.8–5.6)
Mean Plasma Glucose: 142.72 mg/dL

## 2019-05-24 LAB — MAGNESIUM: Magnesium: 1.7 mg/dL (ref 1.7–2.4)

## 2019-05-24 NOTE — Progress Notes (Signed)
    CC:  Subjective: Less RUQ pain Objective: Vital signs in last 24 hours: Temp:  [97.4 F (36.3 C)-99.7 F (37.6 C)] 99.6 F (37.6 C) (04/23 0400) Pulse Rate:  [82-117] 84 (04/23 0420) Resp:  [19-39] 23 (04/23 0420) BP: (95-141)/(57-78) 125/74 (04/23 0400) SpO2:  [87 %-100 %] 100 % (04/23 0420)   360PO 600 IV recorded Urine 300 recorded Drain 100 recorded Afebrile, tachycardia improving  Labs pending   Intake/Output from previous day: 04/22 0701 - 04/23 0700 In: 478.3 [P.O.:360; IV Piggyback:118.3] Out: 400 [Urine:300; Drains:100] Intake/Output this shift: No intake/output data recorded.  General appearance: cooperative Resp: clear to auscultation bilaterally Cardio: regular rate and rhythm GI: soft, mild RUQ tenderness, chole drain functional  Lab Results:  Recent Labs    05/23/19 0253 05/23/19 0433  WBC 10.2  --   HGB 14.9 13.6  HCT 45.7 40.0  PLT 208  --     BMET Recent Labs    05/23/19 0253 05/23/19 0433  NA 135 138  K 3.3* 2.8*  CL 104  --   CO2 17*  --   GLUCOSE 186*  --   BUN 24*  --   CREATININE 1.77*  --   CALCIUM 9.4  --    PT/INR Recent Labs    05/23/19 0253  LABPROT 15.5*  INR 1.2    Recent Labs  Lab 05/23/19 0253  AST 25  ALT 18  ALKPHOS 180*  BILITOT 1.1  PROT 7.3  ALBUMIN 3.0*     Lipase     Component Value Date/Time   LIPASE 23 12/07/2017 0524     Medications: . heparin  5,000 Units Subcutaneous Q8H  . sodium chloride flush  3 mL Intravenous Q12H  . sodium chloride flush  5 mL Intracatheter Q8H   . sodium chloride 75 mL/hr at 05/23/19 1723  . cefTRIAXone (ROCEPHIN)  IV 2 g (05/23/19 2212)  . metronidazole 500 mg (05/24/19 0535)   Assessment/Plan RLL pneumonia AKI Hx asthma Hypokalemia Hx of hypertension  Sepsis Acute cholecystitis - S/P IR drain placement 05/23/19  FEN:  IV fluids/regular diet ID:  Rocephin/Flagyl 4/22 DVT:  Heparin Follow up:  Dr. Georganna Skeans    LOS: 1 day     JENNINGS,WILLARD 05/24/2019 Please see Amion  Georganna Skeans, MD, MPH, FACS Please use [email or web address] to contact on call provider

## 2019-05-24 NOTE — Progress Notes (Signed)
Referring Physician(s): * No referring provider recorded for this case *  Supervising Physician: Jacqulynn Cadet  Patient Status:  [facility] - In-pt  Chief Complaint: Acute cholecystitis  Subjective: Sleeping soundly, but wakes easily.  States she has been feeling a little better, still without much appetite. No complaints related to drain.   Allergies: Patient has no known allergies.  Medications: Prior to Admission medications   Medication Sig Start Date End Date Taking? Authorizing Provider  albuterol (VENTOLIN HFA) 108 (90 Base) MCG/ACT inhaler Inhale 1-2 puffs into the lungs every 4 (four) hours as needed for wheezing or shortness of breath. Patient not taking: Reported on 05/23/2019 04/23/19   Sherwood Gambler, MD  beclomethasone (QVAR) 40 MCG/ACT inhaler Inhale 1 puff into the lungs 2 (two) times daily. Patient not taking: Reported on 04/22/2019 02/04/17   Dorie Rank, MD  Cetirizine HCl 10 MG CAPS Take 1 capsule (10 mg total) by mouth daily for 10 days. Patient not taking: Reported on 12/06/2017 07/12/17 07/22/17  Wieters, Hallie C, PA-C  ondansetron (ZOFRAN) 4 MG tablet Take 1 tablet (4 mg total) by mouth every 6 (six) hours as needed for nausea. Patient not taking: Reported on 04/22/2019 12/07/17   Raiford Noble Latif, DO  traMADol (ULTRAM) 50 MG tablet Take 1 tablet (50 mg total) by mouth every 6 (six) hours as needed. Patient not taking: Reported on 12/06/2017 02/04/17   Dorie Rank, MD     Vital Signs: BP (!) 145/75 (BP Location: Right Arm)   Pulse 84   Temp 98.6 F (37 C) (Oral)   Resp (!) 23   Ht 5\' 5"  (1.651 m)   Wt 200 lb 9.9 oz (91 kg)   SpO2 97%   BMI 33.38 kg/m   Physical Exam  NAD, alert Abdomen: soft.  RUQ drain in place.  Insertion site c/d/i.  Pale bilious output with some debris in collection bag. Flushes easily.   Imaging: IR Perc Cholecystostomy  Result Date: 05/23/2019 INDICATION: 74 year old female with acute calculus cholecystitis and acute sepsis.  She is considered a high risk surgical candidate at this time and therefore presents for percutaneous cholecystostomy tube placement. EXAM: CHOLECYSTOSTOMY MEDICATIONS: Patient is currently an inpatient and receiving intravenous antibiotics. No additional antibiotic coverage was provided. ANESTHESIA/SEDATION: Moderate (conscious) sedation was employed during this procedure. A total of Versed 1 mg, 0.5 mg Dilaudid and Fentanyl 50 mcg was administered intravenously. Moderate Sedation Time: 19 minutes. The patient's level of consciousness and vital signs were monitored continuously by radiology nursing throughout the procedure under my direct supervision. FLUOROSCOPY TIME:  Fluoroscopy Time: 1 minutes 36 seconds (17 mGy). COMPLICATIONS: None immediate. PROCEDURE: Informed written consent was obtained from the patient after a thorough discussion of the procedural risks, benefits and alternatives. All questions were addressed. Maximal Sterile Barrier Technique was utilized including caps, mask, sterile gowns, sterile gloves, sterile drape, hand hygiene and skin antiseptic. A timeout was performed prior to the initiation of the procedure. The right upper quadrant was interrogated with ultrasound. The hydropic gallbladder containing multiple stones was successfully identified. A suitable skin entry site was selected and marked. The overlying skin was sterilely prepped and draped in the standard fashion using chlorhexidine skin prep. Local anesthesia was attained by infiltration with 1% lidocaine. Under real-time ultrasound guidance, a 21 gauge Accustick needle was advanced along a short transhepatic course and into the gallbladder lumen. A wire was advanced into the gallbladder lumen. The needle was exchanged for the transitional peel-away sheath which was successfully  advanced into the gallbladder lumen. A gentle hand injection of contrast material opacifies the bladder lumen. An Amplatz wire was then coiled in the  gallbladder. The transitional sheath was removed. The transhepatic tract was dilated to 10 [place] and a Cook 10.2 [place] all-purpose drainage catheter was advanced over the wire and formed in the gallbladder. There was copious return of foul-smelling purulent bile. Samples were aspirated and sent for Gram stain and culture. The catheter was connected to gravity bag drainage and then secured to the skin with 0 Prolene suture. Sterile bandages were applied. IMPRESSION: Successful placement of a transhepatic 10 French percutaneous cholecystostomy tube for the indication of acute calculus cholecystitis in a septic, high risk patient who is a poor candidate for surgery. Electronically Signed   By: Jacqulynn Cadet M.D.   On: 05/23/2019 17:37   DG Chest Port 1 View  Result Date: 05/23/2019 CLINICAL DATA:  Shortness of breath, sepsis EXAM: PORTABLE CHEST 1 VIEW COMPARISON:  04/22/2019 FINDINGS: Lingular atelectasis. Right lung clear. Heart is normal size. No effusions or acute bony abnormality. IMPRESSION: Lingular atelectasis. Electronically Signed   By: Rolm Baptise M.D.   On: 05/23/2019 03:20   CT Renal Stone Study  Result Date: 05/23/2019 CLINICAL DATA:  Malaise. Diffuse abdominal pain. EXAM: CT ABDOMEN AND PELVIS WITHOUT CONTRAST TECHNIQUE: Multidetector CT imaging of the abdomen and pelvis was performed following the standard protocol without IV contrast. COMPARISON:  Abdominal ultrasound 12/06/2017 FINDINGS: Lower chest: Asymmetric right lower lobe airspace disease is present. Minimal atelectasis is present at the left base. The heart size is normal. No significant pleural or pericardial effusion is present. Hepatobiliary: Liver is within normal limits. Numerous gallstones are present. Inflammatory changes are evident about the gallbladder with gallbladder wall thickening. Common bile duct is unremarkable. Pancreas: Unremarkable. No pancreatic ductal dilatation or surrounding inflammatory changes. Spleen:  Normal in size without focal abnormality. Adrenals/Urinary Tract: Adrenal glands are normal bilaterally. Kidneys and ureters are within normal limits. No stone or mass lesion is present. Urinary bladder is normal, collapsed. Stomach/Bowel: Inflammatory changes are noted in the gastric antrum and proximal duodenum. No discrete ulcer or abscess is present. Small bowel is within normal limits. Terminal ileum is normal. Inflammatory changes are present at the hepatic flexure. Transverse colon is normal. Descending and sigmoid colon are normal. Vascular/Lymphatic: Atherosclerotic changes are noted in the aorta branch vessels without aneurysm. Reproductive: Fibroid uterus is present. Uterus and adnexa are otherwise within limits. Other: No abdominal wall hernia or abnormality. No abdominopelvic ascites. Musculoskeletal: Degenerative changes are present the lumbar spine. Facet hypertrophy contributes to foraminal stenosis bilaterally at L5-S1. Vertebral body heights are maintained. No focal lytic or blastic lesions are present. Bony pelvis is within normal limits. The hips are located and within limits bilaterally. IMPRESSION: 1. Inflammatory changes about the gallbladder with gallbladder wall thickening and cholelithiasis compatible with acute cholecystitis. 2. Diffuse inflammatory changes are also present at the gastric antrum and duodenum. This may represent acute duodenitis versus cholecystitis. 3. Inflammatory changes at the hepatic flexure of the colon are likely secondary. 4. Asymmetric right lower lobe airspace disease concerning for pneumonia or aspiration. 5. Fibroid uterus. 6. Aortic Atherosclerosis (ICD10-I70.0). Electronically Signed   By: San Morelle M.D.   On: 05/23/2019 04:54    Labs:  CBC: Recent Labs    04/22/19 2157 05/23/19 0253 05/23/19 0433 05/24/19 0759  WBC 6.1 10.2  --  14.5*  HGB 13.6 14.9 13.6 13.4  HCT 43.9 45.7 40.0 41.9  PLT 254  208  --  144*    COAGS: Recent Labs      05/23/19 0253  INR 1.2  APTT 28    BMP: Recent Labs    04/22/19 2157 05/23/19 0253 05/23/19 0433 05/24/19 0759  NA 141 135 138 139  K 4.0 3.3* 2.8* 4.2  CL 108 104  --  112*  CO2 26 17*  --  16*  GLUCOSE 116* 186*  --  114*  BUN 10 24*  --  17  CALCIUM 10.0 9.4  --  8.3*  CREATININE 0.98 1.77*  --  1.12*  GFRNONAA 58* 28*  --  49*  GFRAA >60 33*  --  57*    LIVER FUNCTION TESTS: Recent Labs    04/22/19 2157 05/23/19 0253 05/24/19 0759  BILITOT 0.5 1.1 1.5*  AST 20 25 31   ALT 18 18 26   ALKPHOS 61 180* 64  PROT 8.1 7.3 6.3*  ALBUMIN 3.9 3.0* 2.3*    Assessment and Plan: Acute Cholecystitis s/p drain placement by Dr. Laurence Ferrari 05/23/19 Patient with some improvement in belly pain.  Still without appetite, but attempting to eat.  WBC actually increased today from 10.2  14.5 Remains afebrile.  Drain intact. FLushes easily.  100 mL output overnight.  Culture positive for Klebsiella oxytoca; possibly other bacteria as well; reincubated.  Remains on IV Flagyl and rocephin.  Continue current drain management.  IR following.   Electronically Signed: Docia Barrier, PA 05/24/2019, 3:54 PM   I spent a total of 15 Minutes at the the patient's bedside AND on the patient's hospital floor or unit, greater than 50% of which was counseling/coordinating care for acute cholecystitis.

## 2019-05-24 NOTE — Evaluation (Signed)
Physical Therapy Evaluation Patient Details Name: Gabriella Butler MRN: [id number] DOB: Jun 08, 1945 Today's Date: 05/24/2019   History of Present Illness  Pt is 74 yo female with PMH including hypertension, asthma with bronchitis, gout, cholelithiasis, and gallstone pancreatitis.  Pt presented with abdominal pain and found to have acute cholecystitis.  She is s/p IR percutaneous cholecystostomy drain on 05/23/19.  Clinical Impression  Pt admitted with above diagnosis. Pt was independent with SPC prior to admission.  Today she required cues for transfer techniques, min A for transfers, and ambulated 3'x2 with UE support and seated rest breaks.  Pt currently with functional limitations due to the deficits listed below (see PT Problem List). Pt will benefit from skilled PT to increase their independence and safety with mobility to allow discharge to the venue listed below.       Follow Up Recommendations SNF    Equipment Recommendations  Rolling walker with 5" wheels    Recommendations for Other Services       Precautions / Restrictions Precautions Precautions: Fall Precaution Comments: cholecystostomy drain (R abd) Restrictions Weight Bearing Restrictions: No      Mobility  Bed Mobility Overal bed mobility: Needs Assistance Bed Mobility: Supine to Sit;Sit to Supine     Supine to sit: Min assist Sit to supine: Min assist   General bed mobility comments: Min A and HOB elevated to elevate trunk; cued for log roll technique for pain control  Transfers Overall transfer level: Needs assistance Equipment used: (held onto back of chair) Transfers: Sit to/from Stand Sit to Stand: Min assist         General transfer comment: performed x 2; cues for safe hand placement  Ambulation/Gait Ambulation/Gait assistance: Min guard Gait Distance (Feet): 3 Feet(x2) Assistive device: (held onto back of chair) Gait Pattern/deviations: Wide base of support;Shuffle;Decreased stride length Gait  velocity: decreased   General Gait Details: side steps toward HOB; required 1 seated rest break  Stairs            Wheelchair Mobility    Modified Rankin (Stroke Patients Only)       Balance Overall balance assessment: Needs assistance Sitting-balance support: No upper extremity supported;Feet supported Sitting balance-Leahy Scale: Good     Standing balance support: Bilateral upper extremity supported Standing balance-Leahy Scale: Fair                               Pertinent Vitals/Pain Pain Assessment: 0-10 Pain Score: 2  Pain Location: R lower abd Pain Descriptors / Indicators: Sore Pain Intervention(s): Limited activity within patient's tolerance;Monitored during session    Home Living Family/patient expects to be discharged to:: Private residence Living Arrangements: Children;Other (Comment)(daughter with her for now) Available Help at Discharge: Family;Available 24 hours/day Type of Home: Apartment Home Access: Level entry     Home Layout: One level Home Equipment: Cane - single point;Grab bars - tub/shower      Prior Function Level of Independence: Needs assistance   Gait / Transfers Assistance Needed: Uses cane to walk; can do short community distances  ADL's / Homemaking Assistance Needed: Reports independent with ADLS; reports can do IADLs but family there if needed  Comments: denies falls at home     Hand Dominance        Extremity/Trunk Assessment   Upper Extremity Assessment Upper Extremity Assessment: Overall WFL for tasks assessed    Lower Extremity Assessment Lower Extremity Assessment: Overall WFL for tasks assessed  Cervical / Trunk Assessment Cervical / Trunk Assessment: Normal  Communication   Communication: No difficulties  Cognition Arousal/Alertness: Awake/alert Behavior During Therapy: WFL for tasks assessed/performed Overall Cognitive Status: Within Functional Limits for tasks assessed                                         General Comments General comments (skin integrity, edema, etc.): VSS on RA    Exercises     Assessment/Plan    PT Assessment Patient needs continued PT services  PT Problem List Decreased strength;Decreased mobility;Decreased coordination;Decreased activity tolerance;Decreased balance;Decreased knowledge of use of DME       PT Treatment Interventions DME instruction;Therapeutic activities;Gait training;Therapeutic exercise;Patient/family education;Balance training;Functional mobility training    PT Goals (Current goals can be found in the Care Plan section)  Acute Rehab PT Goals Patient Stated Goal: "I may need a little rehab before going home" PT Goal Formulation: With patient Time For Goal Achievement: 06/07/19 Potential to Achieve Goals: Good    Frequency Min 3X/week   Barriers to discharge        Co-evaluation               AM-PAC PT "6 Clicks" Mobility  Outcome Measure Help needed turning from your back to your side while in a flat bed without using bedrails?: A Little Help needed moving from lying on your back to sitting on the side of a flat bed without using bedrails?: A Little Help needed moving to and from a bed to a chair (including a wheelchair)?: A Little Help needed standing up from a chair using your arms (e.g., wheelchair or bedside chair)?: A Little Help needed to walk in hospital room?: A Little Help needed climbing 3-5 steps with a railing? : A Lot 6 Click Score: 17    End of Session Equipment Utilized During Treatment: Gait belt Activity Tolerance: Patient tolerated treatment well Patient left: in bed;with call bell/phone within reach;with bed alarm set Nurse Communication: Mobility status PT Visit Diagnosis: Unsteadiness on feet (R26.81);Muscle weakness (generalized) (M62.81)    Time: 1700-1730 PT Time Calculation (min) (ACUTE ONLY): 30 min   Charges:   PT Evaluation $PT Eval Low Complexity: 1  Low PT Treatments $Therapeutic Activity: 8-22 mins        Maggie Font, PT Acute Rehab Services Pager [phone] [facility] [phone] [facility] [phone]   Gabriella Butler 05/24/2019, 5:43 PM

## 2019-05-24 NOTE — Progress Notes (Signed)
PROGRESS NOTE                                                                                                                                                                                                             Patient Demographics:    Gabriella Butler, is a 74 y.o. female, DOB - 07/07/1945, ZX:[id number]  Admit date - 05/23/2019   Admitting Physician Norval Morton, MD  Outpatient Primary MD for the patient is Patient, No Pcp Per  LOS - 1   Chief Complaint  Patient presents with  . Tachycardia  . Hypotension  . Lethargic       Brief Narrative    74 y.o. female with medical history significant of hypertension, asthma with bronchitis, gout, cholelithiasis, and gallstone pancreatitis presents with complaints of abdominal pain , her work-up significant for acute cholecystitis, patient with hospitalization couple years ago significant for cholelithiasis, and acute pancreatitis, but she did not follow-up as an outpatient, she did not want to proceed with surgery when.  He presents this admission with sepsis secondary to acute cholecystitis/bacteremia , status post IR percutaneous cholecystostomy drain, surgery is following.   Subjective:    Gabriella Butler today reports some abdominal pain, but reported has improved, denies any nausea or vomiting,.  .   Assessment  & Plan :    Principal Problem:   Sepsis ([place]) Active Problems:   Cholelithiasis with acute cholecystitis   Asthma   Hypomagnesemia   Hypokalemia   Obesity (BMI 30-39.9)   History of prediabetes   Right lower lobe pneumonia   Sepsis secondary to cholelithiasis with acute cholecystitis/bacteremia: -Is present on admission with fever up to 103.3 F, tachycardic, tachypneic, and hypotensive.  Labs significant for WBC 10.2 with signs of left shift and lactic acid elevated up to 3.8.   - CT revealed gallbladder wall thickening and cholelithiasis concerning for acute cholecystitis.   General surgery have been formally consulted, recommendation for percutaneous cholecystectomy drain.  Placed by IR 4/22. -continue  with pain control. -Regular diet. -Blood culture growing Klebsiella oxytoca, and still showing gram-positive cocci, for now continue with broad-spectrum antibiotics, her percutaneous cholecystostomy drain growing Klebsiella oxytoca as well, Gram stain as well. -Appreciate general surgery and IR consultative services  Right lower lobe pneumonia:  - Acute.  Patient reports having intermittent productive cough.   -Antibiotics  as seen above -Was encouraged use incentive spirometry  Acute kidney injury:  Patient presents with creatinine elevated up to 1.77 with BUN 24.  Baseline previously noted to be 0.98 just 29-month ago. -Improving with IV fluids  Hyperglycemia, history of prediabetes: -  Acute.  Glucose on admission elevated to 186.  Last hemoglobin A1c noted to be 6.2 in 12/2017. - A1c is 6.6 -Consider placing on sliding scale insulin if needed  Hypokalemia:  - repleted  Hypomagnesemia:  - repleted  Elevated alkaline phosphatase:  - Trending down.  History of asthma:  - Patient currently not wheezing.  Appears to have not been taking Qvar or albuterol at home. -Albuterol as needed for shortness of breath/wheezing  Obesity: BMI 33.38 kg/m    COVID-19 Labs  No results for input(s): DDIMER, FERRITIN, LDH, CRP in the last 72 hours.  Lab Results  Component Value Date   SARSCOV2NAA NEGATIVE 05/23/2019   [place] NEGATIVE 04/23/2019     Code Status : Full   Family Communication  : Discussed with her cousin at bedside  Disposition Plan  :  Status is: Inpatient  Remains inpatient appropriate because:IV treatments appropriate due to intensity of illness or inability to take PO   Dispo: The patient is from: Home              Anticipated d/c is to: Home              Anticipated d/c date is: > 3 days              Patient  currently is not medically stable to d/c.   Consults  :  General surgery  Procedures  : Percutaneous cholecystostomy drain by IR 4/22  DVT Prophylaxis  :   [place] heparin   Lab Results  Component Value Date   PLT 144 (L) 05/24/2019    Antibiotics  :    Anti-infectives (From admission, onward)   Start     Dose/Rate Route Frequency Ordered Stop   05/23/19 2200  vancomycin (VANCOREADY) IVPB 750 mg/150 mL  Status:  Discontinued     750 mg 150 mL/hr over 60 Minutes Intravenous Every 24 hours 05/23/19 0518 05/23/19 2136   05/23/19 2200  cefTRIAXone (ROCEPHIN) 2 g in sodium chloride 0.9 % 100 mL IVPB     2 g 200 mL/hr over 30 Minutes Intravenous Every 24 hours 05/23/19 2136     05/23/19 1200  metroNIDAZOLE (FLAGYL) IVPB 500 mg     500 mg 100 mL/hr over 60 Minutes Intravenous Every 8 hours 05/23/19 0617     05/23/19 1000  ceFEPIme (MAXIPIME) 2 g in sodium chloride 0.9 % 100 mL IVPB  Status:  Discontinued     2 g 200 mL/hr over 30 Minutes Intravenous Every 12 hours 05/23/19 0518 05/23/19 2136   05/23/19 0300  ceFEPIme (MAXIPIME) 2 g in sodium chloride 0.9 % 100 mL IVPB     2 g 200 mL/hr over 30 Minutes Intravenous  Once 05/23/19 0253 05/23/19 0340   05/23/19 0300  metroNIDAZOLE (FLAGYL) IVPB 500 mg     500 mg 100 mL/hr over 60 Minutes Intravenous  Once 05/23/19 0253 05/23/19 0408   05/23/19 0300  vancomycin (VANCOCIN) IVPB 1000 mg/200 mL premix     1,000 mg 200 mL/hr over 60 Minutes Intravenous  Once 05/23/19 0253 05/23/19 0451        Objective:   Vitals:   05/24/19 0100 05/24/19 0400 05/24/19 0420 05/24/19 0800  BP: 119/68 125/74  Marland Kitchen)  145/75  Pulse: (!) 104 90 84   Resp: (!) 27 (!) 26 (!) 23 (!) 23  Temp: 98.5 F (36.9 C) 99.6 F (37.6 C)  98.6 F (37 C)  TempSrc: Oral Oral  Oral  SpO2: 99% 99% 100% 97%  Weight:      Height:        Wt Readings from Last 3 Encounters:  05/23/19 91 kg  04/22/19 90.7 kg  12/06/17 90.7 kg     Intake/Output Summary (Last 24 hours) at  05/24/2019 1214 Last data filed at 05/24/2019 0535 Gross per 24 hour  Intake 360 ml  Output 400 ml  Net -40 ml     Physical Exam  Awake Alert, frail, answering questions appropriately. Symmetrical Chest wall movement, Good air movement bilaterally, CTAB RRR,No Gallops,Rubs or new Murmurs, No Parasternal Heave +ve B.Sounds, Abd Soft, quadrant tenderness, right upper quadrant drain functional,No rebound - guarding or rigidity. No Cyanosis, Clubbing or edema, No new Rash or bruise      Data Review:    CBC Recent Labs  Lab 05/23/19 0253 05/23/19 0433 05/24/19 0759  WBC 10.2  --  14.5*  HGB 14.9 13.6 13.4  HCT 45.7 40.0 41.9  PLT 208  --  144*  MCV 89.3  --  91.5  MCH 29.1  --  29.3  MCHC 32.6  --  32.0  RDW 13.6  --  14.5  LYMPHSABS 0.8  --   --   MONOABS 0.1  --   --   EOSABS 0.0  --   --   BASOSABS 0.1  --   --     Chemistries  Recent Labs  Lab 05/23/19 0253 05/23/19 0433 05/23/19 0530 05/24/19 0759  NA 135 138  --  139  K 3.3* 2.8*  --  4.2  CL 104  --   --  112*  CO2 17*  --   --  16*  GLUCOSE 186*  --   --  114*  BUN 24*  --   --  17  CREATININE 1.77*  --   --  1.12*  CALCIUM 9.4  --   --  8.3*  MG  --   --  1.6* 1.7  AST 25  --   --  31  ALT 18  --   --  26  ALKPHOS 180*  --   --  64  BILITOT 1.1  --   --  1.5*   ------------------------------------------------------------------------------------------------------------------ No results for input(s): CHOL, HDL, LDLCALC, TRIG, CHOLHDL, LDLDIRECT in the last 72 hours.  Lab Results  Component Value Date   HGBA1C 6.6 (H) 05/24/2019   ------------------------------------------------------------------------------------------------------------------ No results for input(s): TSH, T4TOTAL, T3FREE, THYROIDAB in the last 72 hours.  Invalid input(s): FREET3 ------------------------------------------------------------------------------------------------------------------ No results for input(s):  VITAMINB12, FOLATE, FERRITIN, TIBC, IRON, RETICCTPCT in the last 72 hours.  Coagulation profile Recent Labs  Lab 05/23/19 0253  INR 1.2    No results for input(s): DDIMER in the last 72 hours.  Cardiac Enzymes No results for input(s): CKMB, TROPONINI, MYOGLOBIN in the last 168 hours.  Invalid input(s): CK ------------------------------------------------------------------------------------------------------------------    Component Value Date/Time   BNP 59.2 04/22/2019 2157    Inpatient Medications  Scheduled Meds: . heparin  5,000 Units Subcutaneous Q8H  . sodium chloride flush  3 mL Intravenous Q12H  . sodium chloride flush  5 mL Intracatheter Q8H   Continuous Infusions: . sodium chloride 75 mL/hr at 05/23/19 1723  . cefTRIAXone (ROCEPHIN)  IV 2  g (05/23/19 2212)  . metronidazole 500 mg (05/24/19 0535)   PRN Meds:.acetaminophen **OR** acetaminophen, albuterol, morphine injection, ondansetron **OR** ondansetron (ZOFRAN) IV  Micro Results Recent Results (from the past 240 hour(s))  Blood Culture (routine x 2)     Status: Abnormal (Preliminary result)   Collection Time: 05/23/19  2:48 AM   Specimen: BLOOD  Result Value Ref Range Status   Specimen Description BLOOD RIGHT ANTECUBITAL  Final   Special Requests   Final    BOTTLES DRAWN AEROBIC AND ANAEROBIC Blood Culture adequate volume   Culture  Setup Time   Final    IN BOTH AEROBIC AND ANAEROBIC BOTTLES GRAM POSITIVE COCCI IN CHAINS GRAM NEGATIVE RODS CRITICAL RESULT CALLED TO, READ BACK BY AND VERIFIED WITH: Hughie Closs [facility] 05/23/19 2006 JDW    Culture (A)  Final    KLEBSIELLA OXYTOCA GRAM POSITIVE COCCI CULTURE REINCUBATED FOR BETTER GROWTH Performed at [facility], [place] [address], [place], [place] [address]    Report Status PENDING  Incomplete  Blood Culture ID Panel (Reflexed)     Status: Abnormal   Collection Time: 05/23/19  2:48 AM  Result Value Ref Range Status   Enterococcus species NOT DETECTED  NOT DETECTED Final   Listeria monocytogenes NOT DETECTED NOT DETECTED Final   Staphylococcus species NOT DETECTED NOT DETECTED Final   Staphylococcus aureus (BCID) NOT DETECTED NOT DETECTED Final   Streptococcus species DETECTED (A) NOT DETECTED Final    Comment: Not Enterococcus species, Streptococcus agalactiae, Streptococcus pyogenes, or Streptococcus pneumoniae. CRITICAL RESULT CALLED TO, READ BACK BY AND VERIFIED WITH: Hughie Closs [facility] 05/23/19 2006 JDW    Streptococcus agalactiae NOT DETECTED NOT DETECTED Final   Streptococcus pneumoniae NOT DETECTED NOT DETECTED Final   Streptococcus pyogenes NOT DETECTED NOT DETECTED Final   Acinetobacter baumannii NOT DETECTED NOT DETECTED Final   Enterobacteriaceae species DETECTED (A) NOT DETECTED Final    Comment: Enterobacteriaceae represent a large family of gram-negative bacteria, not a single organism. CRITICAL RESULT CALLED TO, READ BACK BY AND VERIFIED WITH: Hughie Closs [facility] 05/23/19 2006 JDW    Enterobacter cloacae complex NOT DETECTED NOT DETECTED Final   Escherichia coli NOT DETECTED NOT DETECTED Final   Klebsiella oxytoca DETECTED (A) NOT DETECTED Final    Comment: CRITICAL RESULT CALLED TO, READ BACK BY AND VERIFIED WITH: Hughie Closs [facility] 05/23/19/2006 JDW    Klebsiella pneumoniae NOT DETECTED NOT DETECTED Final   Proteus species NOT DETECTED NOT DETECTED Final   Serratia marcescens NOT DETECTED NOT DETECTED Final   Carbapenem resistance NOT DETECTED NOT DETECTED Final   Haemophilus influenzae NOT DETECTED NOT DETECTED Final   Neisseria meningitidis NOT DETECTED NOT DETECTED Final   Pseudomonas aeruginosa NOT DETECTED NOT DETECTED Final   Candida albicans NOT DETECTED NOT DETECTED Final   Candida glabrata NOT DETECTED NOT DETECTED Final   Candida krusei NOT DETECTED NOT DETECTED Final   Candida parapsilosis NOT DETECTED NOT DETECTED Final   Candida tropicalis NOT DETECTED NOT DETECTED Final    Comment: Performed at [facility], [place]. [address] [place], [place] [address]  Urine culture     Status: None   Collection Time: 05/23/19  2:53 AM   Specimen: In/Out Cath Urine  Result Value Ref Range Status   Specimen Description IN/OUT CATH URINE  Final   Special Requests NONE  Final   Culture   Final    NO GROWTH Performed at [facility], [place],  [place] [address]    Report Status 05/24/2019 FINAL  Final  Respiratory Panel by RT PCR (Flu A&B, Covid) - Nasopharyngeal Swab     Status: None   Collection Time: 05/23/19  3:19 AM   Specimen: Nasopharyngeal Swab  Result Value Ref Range Status   SARS Coronavirus 2 by RT PCR NEGATIVE NEGATIVE Final    Comment: (NOTE) SARS-CoV-2 target nucleic acids are NOT DETECTED. The SARS-CoV-2 RNA is generally detectable in upper respiratoy specimens during the acute phase of infection. The lowest concentration of SARS-CoV-2 viral copies this assay can detect is 131 copies/mL. A negative result does not preclude SARS-Cov-2 infection and should not be used as the sole basis for treatment or other patient management decisions. A negative result may occur with  improper specimen collection/handling, submission of specimen other than nasopharyngeal swab, presence of viral mutation(s) within the areas targeted by this assay, and inadequate number of viral copies (<131 copies/mL). A negative result must be combined with clinical observations, patient history, and epidemiological information. The expected result is Negative. Fact Sheet for Patients:  PinkCheek.be Fact Sheet for Healthcare Providers:  GravelBags.it This test is not yet ap proved or cleared by the [place] FDA and  has been authorized for detection and/or diagnosis of SARS-CoV-2 by FDA under an Emergency Use Authorization (EUA). This EUA will remain  in effect (meaning this test can be used) for the duration of the COVID-19  declaration under Section 564(b)(1) of the Act, 21 U.S.C. section 360bbb-3(b)(1), unless the authorization is terminated or revoked sooner.    Influenza A by PCR NEGATIVE NEGATIVE Final   Influenza B by PCR NEGATIVE NEGATIVE Final    Comment: (NOTE) The Xpert Xpress SARS-CoV-2/FLU/RSV assay is intended as an aid in  the diagnosis of influenza from Nasopharyngeal swab specimens and  should not be used as a sole basis for treatment. Nasal washings and  aspirates are unacceptable for Xpert Xpress SARS-CoV-2/FLU/RSV  testing. Fact Sheet for Patients: PinkCheek.be Fact Sheet for Healthcare Providers: GravelBags.it This test is not yet approved or cleared by the [place] FDA and  has been authorized for detection and/or diagnosis of SARS-CoV-2 by  FDA under an Emergency Use Authorization (EUA). This EUA will remain  in effect (meaning this test can be used) for the duration of the  Covid-19 declaration under Section 564(b)(1) of the Act, 21  U.S.C. section 360bbb-3(b)(1), unless the authorization is  terminated or revoked. Performed at [facility], [place] [address] [place], [place] [address]   Body fluid culture     Status: None (Preliminary result)   Collection Time: 05/23/19  5:12 PM   Specimen: Gallbladder; Body Fluid  Result Value Ref Range Status   Specimen Description GALL BLADDER  Final   Special Requests BILE  Final   Gram Stain   Final    ABUNDANT WBC PRESENT, PREDOMINANTLY PMN ABUNDANT GRAM POSITIVE COCCI ABUNDANT GRAM NEGATIVE RODS ABUNDANT GRAM POSITIVE RODS    Culture   Final    FEW KLEBSIELLA OXYTOCA CULTURE REINCUBATED FOR BETTER GROWTH Performed at [facility], [address] [place], [place] [address]    Report Status PENDING  Incomplete    Radiology Reports IR Perc Cholecystostomy  Result Date: 05/23/2019 INDICATION: 74 year old female with acute calculus cholecystitis and acute  sepsis. She is considered a high risk surgical candidate at this time and therefore presents for percutaneous cholecystostomy tube placement. EXAM: CHOLECYSTOSTOMY MEDICATIONS: Patient is currently an inpatient and receiving intravenous antibiotics. No  additional antibiotic coverage was provided. ANESTHESIA/SEDATION: Moderate (conscious) sedation was employed during this procedure. A total of Versed 1 mg, 0.5 mg Dilaudid and Fentanyl 50 mcg was administered intravenously. Moderate Sedation Time: 19 minutes. The patient's level of consciousness and vital signs were monitored continuously by radiology nursing throughout the procedure under my direct supervision. FLUOROSCOPY TIME:  Fluoroscopy Time: 1 minutes 36 seconds (17 mGy). COMPLICATIONS: None immediate. PROCEDURE: Informed written consent was obtained from the patient after a thorough discussion of the procedural risks, benefits and alternatives. All questions were addressed. Maximal Sterile Barrier Technique was utilized including caps, mask, sterile gowns, sterile gloves, sterile drape, hand hygiene and skin antiseptic. A timeout was performed prior to the initiation of the procedure. The right upper quadrant was interrogated with ultrasound. The hydropic gallbladder containing multiple stones was successfully identified. A suitable skin entry site was selected and marked. The overlying skin was sterilely prepped and draped in the standard fashion using chlorhexidine skin prep. Local anesthesia was attained by infiltration with 1% lidocaine. Under real-time ultrasound guidance, a 21 gauge Accustick needle was advanced along a short transhepatic course and into the gallbladder lumen. A wire was advanced into the gallbladder lumen. The needle was exchanged for the transitional peel-away sheath which was successfully advanced into the gallbladder lumen. A gentle hand injection of contrast material opacifies the bladder lumen. An Amplatz wire was then coiled in  the gallbladder. The transitional sheath was removed. The transhepatic tract was dilated to 10 [place] and a Cook 10.2 [place] all-purpose drainage catheter was advanced over the wire and formed in the gallbladder. There was copious return of foul-smelling purulent bile. Samples were aspirated and sent for Gram stain and culture. The catheter was connected to gravity bag drainage and then secured to the skin with 0 Prolene suture. Sterile bandages were applied. IMPRESSION: Successful placement of a transhepatic 10 French percutaneous cholecystostomy tube for the indication of acute calculus cholecystitis in a septic, high risk patient who is a poor candidate for surgery. Electronically Signed   By: Jacqulynn Cadet M.D.   On: 05/23/2019 17:37   DG Chest Port 1 View  Result Date: 05/23/2019 CLINICAL DATA:  Shortness of breath, sepsis EXAM: PORTABLE CHEST 1 VIEW COMPARISON:  04/22/2019 FINDINGS: Lingular atelectasis. Right lung clear. Heart is normal size. No effusions or acute bony abnormality. IMPRESSION: Lingular atelectasis. Electronically Signed   By: Rolm Baptise M.D.   On: 05/23/2019 03:20   CT Renal Stone Study  Result Date: 05/23/2019 CLINICAL DATA:  Malaise. Diffuse abdominal pain. EXAM: CT ABDOMEN AND PELVIS WITHOUT CONTRAST TECHNIQUE: Multidetector CT imaging of the abdomen and pelvis was performed following the standard protocol without IV contrast. COMPARISON:  Abdominal ultrasound 12/06/2017 FINDINGS: Lower chest: Asymmetric right lower lobe airspace disease is present. Minimal atelectasis is present at the left base. The heart size is normal. No significant pleural or pericardial effusion is present. Hepatobiliary: Liver is within normal limits. Numerous gallstones are present. Inflammatory changes are evident about the gallbladder with gallbladder wall thickening. Common bile duct is unremarkable. Pancreas: Unremarkable. No pancreatic ductal dilatation or surrounding inflammatory changes.  Spleen: Normal in size without focal abnormality. Adrenals/Urinary Tract: Adrenal glands are normal bilaterally. Kidneys and ureters are within normal limits. No stone or mass lesion is present. Urinary bladder is normal, collapsed. Stomach/Bowel: Inflammatory changes are noted in the gastric antrum and proximal duodenum. No discrete ulcer or abscess is present. Small bowel is within normal limits. Terminal ileum is normal. Inflammatory changes are present  at the hepatic flexure. Transverse colon is normal. Descending and sigmoid colon are normal. Vascular/Lymphatic: Atherosclerotic changes are noted in the aorta branch vessels without aneurysm. Reproductive: Fibroid uterus is present. Uterus and adnexa are otherwise within limits. Other: No abdominal wall hernia or abnormality. No abdominopelvic ascites. Musculoskeletal: Degenerative changes are present the lumbar spine. Facet hypertrophy contributes to foraminal stenosis bilaterally at L5-S1. Vertebral body heights are maintained. No focal lytic or blastic lesions are present. Bony pelvis is within normal limits. The hips are located and within limits bilaterally. IMPRESSION: 1. Inflammatory changes about the gallbladder with gallbladder wall thickening and cholelithiasis compatible with acute cholecystitis. 2. Diffuse inflammatory changes are also present at the gastric antrum and duodenum. This may represent acute duodenitis versus cholecystitis. 3. Inflammatory changes at the hepatic flexure of the colon are likely secondary. 4. Asymmetric right lower lobe airspace disease concerning for pneumonia or aspiration. 5. Fibroid uterus. 6. Aortic Atherosclerosis (ICD10-I70.0). Electronically Signed   By: San Morelle M.D.   On: 05/23/2019 04:54     Phillips Climes M.D on 05/24/2019 at 12:14 PM  Between 7am to 7pm - Pager - [phone]  After 7pm go to [email or web address] - password [facility]  [facility] -  Office  [phone]

## 2019-05-25 LAB — COMPREHENSIVE METABOLIC PANEL
ALT: 16 U/L (ref 0–44)
AST: 16 U/L (ref 15–41)
Albumin: 2.1 g/dL — ABNORMAL LOW (ref 3.5–5.0)
Alkaline Phosphatase: 61 U/L (ref 38–126)
Anion gap: 7 (ref 5–15)
BUN: 14 mg/dL (ref 8–23)
CO2: 20 mmol/L — ABNORMAL LOW (ref 22–32)
Calcium: 8.3 mg/dL — ABNORMAL LOW (ref 8.9–10.3)
Chloride: 114 mmol/L — ABNORMAL HIGH (ref 98–111)
Creatinine, Ser: 0.87 mg/dL (ref 0.44–1.00)
GFR calc Af Amer: >60 mL/min (ref >60)
GFR calc non Af Amer: >60 mL/min (ref >60)
Glucose, Bld: 100 mg/dL — ABNORMAL HIGH (ref 70–99)
Potassium: 3.2 mmol/L — ABNORMAL LOW (ref 3.5–5.1)
Sodium: 141 mmol/L (ref 135–145)
Total Bilirubin: 0.6 mg/dL (ref 0.3–1.2)
Total Protein: 5.8 g/dL — ABNORMAL LOW (ref 6.5–8.1)

## 2019-05-25 LAB — CBC
HCT: 35.6 % — ABNORMAL LOW (ref 36.0–46.0)
Hemoglobin: 11.6 g/dL — ABNORMAL LOW (ref 12.0–15.0)
MCH: 29.6 pg (ref 26.0–34.0)
MCHC: 32.6 g/dL (ref 30.0–36.0)
MCV: 90.8 fL (ref 80.0–100.0)
Platelets: 172 10*3/uL (ref 150–400)
RBC: 3.92 MIL/uL (ref 3.87–5.11)
RDW: 14.6 % (ref 11.5–15.5)
WBC: 11.7 10*3/uL — ABNORMAL HIGH (ref 4.0–10.5)
nRBC: 0 % (ref 0.0–0.2)

## 2019-05-25 MED ORDER — POTASSIUM CHLORIDE CRYS ER 20 MEQ PO TBCR
40.0000 meq | EXTENDED_RELEASE_TABLET | Freq: Four times a day (QID) | ORAL | Status: AC
  Administered 2019-05-25 (×2): 40 meq via ORAL
  Filled 2019-05-25 (×3): qty 2

## 2019-05-25 MED ORDER — SODIUM CHLORIDE 0.9 % IV SOLN
INTRAVENOUS | Status: DC | PRN
  Administered 2019-05-25 – 2019-05-27 (×2): 250 mL via INTRAVENOUS

## 2019-05-25 MED ORDER — SODIUM CHLORIDE 0.9% FLUSH
10.0000 mL | Freq: Two times a day (BID) | INTRAVENOUS | Status: DC
  Administered 2019-05-26 – 2019-05-28 (×3): 10 mL

## 2019-05-25 MED ORDER — SODIUM CHLORIDE 0.9% FLUSH: 10.0000 mL | INTRAVENOUS | Status: DC | PRN

## 2019-05-25 MED ORDER — SENNOSIDES-DOCUSATE SODIUM 8.6-50 MG PO TABS
2.0000 | ORAL_TABLET | Freq: Two times a day (BID) | ORAL | Status: DC
  Administered 2019-05-25 – 2019-05-28 (×5): 2 via ORAL
  Filled 2019-05-25 (×6): qty 2

## 2019-05-25 NOTE — Progress Notes (Signed)
PROGRESS NOTE                                                                                                                                                                                                             Patient Demographics:    Gabriella Butler, is a 74 y.o. female, DOB - [birth year], ZX:[id number]  Admit date - 05/23/2019   Admitting Physician Gabriella Morton, MD  Outpatient Primary MD for the patient is Patient, No Pcp Per  LOS - 2   Chief Complaint  Patient presents with  . Tachycardia  . Hypotension  . Lethargic       Brief Narrative    74 y.o. female with medical history significant of hypertension, asthma with bronchitis, gout, cholelithiasis, and gallstone pancreatitis presents with complaints of abdominal pain , her work-up significant for acute cholecystitis, patient with hospitalization couple years ago significant for cholelithiasis, and acute pancreatitis, but she did not follow-up as an outpatient, she did not want to proceed with surgery when.  He presents this admission with sepsis secondary to acute cholecystitis/bacteremia , status post IR percutaneous cholecystostomy drain, surgery is following.   Subjective:    Gabriella Butler today reports some constipation, no nausea or vomiting, abdominal pain is controlled .   Assessment  & Plan :    Principal Problem:   Sepsis ([place]) Active Problems:   Cholelithiasis with acute cholecystitis   Asthma   Hypomagnesemia   Hypokalemia   Obesity (BMI 30-39.9)   History of prediabetes   Right lower lobe pneumonia   Sepsis secondary to cholelithiasis with acute cholecystitis/bacteremia: -Is present on admission with fever up to 103.3 F, tachycardic, tachypneic, and hypotensive.  Labs significant for WBC 10.2 with signs of left shift and lactic acid elevated up to 3.8.   - CT revealed gallbladder wall thickening and cholelithiasis concerning for acute cholecystitis.  General  surgery have been formally consulted, recommendation for percutaneous cholecystectomy drain.  Placed by IR 4/22. -continue  with pain control. -Regular diet. -Blood culture growing Klebsiella oxytoca, and s showing gram-positive cocci, her percutaneous cholecystostomy drain growing Klebsiella oxytoca as well, Gram stain as well. -IV Rocephin and Flagyl and adjust antibiotics as needed -Appreciate general surgery and IR consultative services  Right lower lobe pneumonia:  - Acute.  Patient reports having intermittent productive cough.   -Antibiotics  as seen above -Was encouraged use incentive spirometry  Acute kidney injury:  Patient presents with creatinine elevated up to 1.77 with BUN 24.  Baseline previously noted to be 0.98 just 16-month ago. -Improving with IV fluids  Hyperglycemia, history of prediabetes: -  Acute.  Glucose on admission elevated to 186.  Last hemoglobin A1c noted to be 6.2 in 12/2017. - A1c is 6.6 -Consider placing on sliding scale insulin if needed  Hypokalemia:  -Sodium low at 3.2 today, repleted  Hypomagnesemia:  - repleted  Elevated alkaline phosphatase:  - Trending down.  History of asthma:  - Patient currently not wheezing.  Appears to have not been taking Qvar or albuterol at home. -Albuterol as needed for shortness of breath/wheezing  Obesity: BMI 33.38 kg/m    COVID-19 Labs  No results for input(s): DDIMER, FERRITIN, LDH, CRP in the last 72 hours.  Lab Results  Component Value Date   SARSCOV2NAA NEGATIVE 05/23/2019   [place] NEGATIVE 04/23/2019     Code Status : Full   Family Communication  : None at bedside today  Disposition Plan  :  Status is: Inpatient  Remains inpatient appropriate because:IV treatments appropriate due to intensity of illness or inability to take PO   Dispo: The patient is from: Home              Anticipated d/c is to: Home              Anticipated d/c date is: > 3 days              Patient  currently is not medically stable to d/c.   Consults  :  General surgery  Procedures  : Percutaneous cholecystostomy drain by IR 4/22  DVT Prophylaxis  :   [place] heparin   Lab Results  Component Value Date   PLT 172 05/25/2019    Antibiotics  :    Anti-infectives (From admission, onward)   Start     Dose/Rate Route Frequency Ordered Stop   05/23/19 2200  vancomycin (VANCOREADY) IVPB 750 mg/150 mL  Status:  Discontinued     750 mg 150 mL/hr over 60 Minutes Intravenous Every 24 hours 05/23/19 0518 05/23/19 2136   05/23/19 2200  cefTRIAXone (ROCEPHIN) 2 g in sodium chloride 0.9 % 100 mL IVPB     2 g 200 mL/hr over 30 Minutes Intravenous Every 24 hours 05/23/19 2136     05/23/19 1200  metroNIDAZOLE (FLAGYL) IVPB 500 mg     500 mg 100 mL/hr over 60 Minutes Intravenous Every 8 hours 05/23/19 0617     05/23/19 1000  ceFEPIme (MAXIPIME) 2 g in sodium chloride 0.9 % 100 mL IVPB  Status:  Discontinued     2 g 200 mL/hr over 30 Minutes Intravenous Every 12 hours 05/23/19 0518 05/23/19 2136   05/23/19 0300  ceFEPIme (MAXIPIME) 2 g in sodium chloride 0.9 % 100 mL IVPB     2 g 200 mL/hr over 30 Minutes Intravenous  Once 05/23/19 0253 05/23/19 0340   05/23/19 0300  metroNIDAZOLE (FLAGYL) IVPB 500 mg     500 mg 100 mL/hr over 60 Minutes Intravenous  Once 05/23/19 0253 05/23/19 0408   05/23/19 0300  vancomycin (VANCOCIN) IVPB 1000 mg/200 mL premix     1,000 mg 200 mL/hr over 60 Minutes Intravenous  Once 05/23/19 0253 05/23/19 0451        Objective:   Vitals:   05/25/19 0400 05/25/19 0739 05/25/19 0834 05/25/19 1318  BP: 107/74  101/83  110/60  Pulse: 87 80  82  Resp: (!) 22 20  17   Temp: 97.9 F (36.6 C) 98 F (36.7 C) 99.3 F (37.4 C) 98.3 F (36.8 C)  TempSrc: Oral Axillary  Axillary  SpO2: 97% 96%  97%  Weight:      Height:        Wt Readings from Last 3 Encounters:  05/23/19 91 kg  04/22/19 90.7 kg  12/06/17 90.7 kg     Intake/Output Summary (Last 24 hours) at  05/25/2019 1625 Last data filed at 05/25/2019 1340 Gross per 24 hour  Intake 659.53 ml  Output 2310 ml  Net -1650.47 ml     Physical Exam  Awake Alert, Oriented X 3, No new F.N deficits, Normal affect Symmetrical Chest wall movement, Good air movement bilaterally, CTAB RRR,No Gallops,Rubs or new Murmurs, No Parasternal Heave +ve B.Sounds, Abd Soft, quadrant percutaneous drain. No Cyanosis, Clubbing or edema, No new Rash or bruise       Data Review:    CBC Recent Labs  Lab 05/23/19 0253 05/23/19 0433 05/24/19 0759 05/25/19 0354  WBC 10.2  --  14.5* 11.7*  HGB 14.9 13.6 13.4 11.6*  HCT 45.7 40.0 41.9 35.6*  PLT 208  --  144* 172  MCV 89.3  --  91.5 90.8  MCH 29.1  --  29.3 29.6  MCHC 32.6  --  32.0 32.6  RDW 13.6  --  14.5 14.6  LYMPHSABS 0.8  --   --   --   MONOABS 0.1  --   --   --   EOSABS 0.0  --   --   --   BASOSABS 0.1  --   --   --     Chemistries  Recent Labs  Lab 05/23/19 0253 05/23/19 0433 05/23/19 0530 05/24/19 0759 05/25/19 0354  NA 135 138  --  139 141  K 3.3* 2.8*  --  4.2 3.2*  CL 104  --   --  112* 114*  CO2 17*  --   --  16* 20*  GLUCOSE 186*  --   --  114* 100*  BUN 24*  --   --  17 14  CREATININE 1.77*  --   --  1.12* 0.87  CALCIUM 9.4  --   --  8.3* 8.3*  MG  --   --  1.6* 1.7  --   AST 25  --   --  31 16  ALT 18  --   --  26 16  ALKPHOS 180*  --   --  64 61  BILITOT 1.1  --   --  1.5* 0.6   ------------------------------------------------------------------------------------------------------------------ No results for input(s): CHOL, HDL, LDLCALC, TRIG, CHOLHDL, LDLDIRECT in the last 72 hours.  Lab Results  Component Value Date   HGBA1C 6.6 (H) 05/24/2019   ------------------------------------------------------------------------------------------------------------------ No results for input(s): TSH, T4TOTAL, T3FREE, THYROIDAB in the last 72 hours.  Invalid input(s):  FREET3 ------------------------------------------------------------------------------------------------------------------ No results for input(s): VITAMINB12, FOLATE, FERRITIN, TIBC, IRON, RETICCTPCT in the last 72 hours.  Coagulation profile Recent Labs  Lab 05/23/19 0253  INR 1.2    No results for input(s): DDIMER in the last 72 hours.  Cardiac Enzymes No results for input(s): CKMB, TROPONINI, MYOGLOBIN in the last 168 hours.  Invalid input(s): CK ------------------------------------------------------------------------------------------------------------------    Component Value Date/Time   BNP 59.2 04/22/2019 2157    Inpatient Medications  Scheduled Meds: . heparin  5,000 Units Subcutaneous Q8H  .  sodium chloride flush  3 mL Intravenous Q12H  . sodium chloride flush  5 mL Intracatheter Q8H   Continuous Infusions: . sodium chloride 75 mL/hr at 05/23/19 1723  . cefTRIAXone (ROCEPHIN)  IV 2 g (05/24/19 2146)  . metronidazole 500 mg (05/25/19 1312)   PRN Meds:.acetaminophen **OR** acetaminophen, albuterol, morphine injection, ondansetron **OR** ondansetron (ZOFRAN) IV  Micro Results Recent Results (from the past 240 hour(s))  Blood Culture (routine x 2)     Status: Abnormal (Preliminary result)   Collection Time: 05/23/19  2:48 AM   Specimen: BLOOD  Result Value Ref Range Status   Specimen Description BLOOD RIGHT ANTECUBITAL  Final   Special Requests   Final    BOTTLES DRAWN AEROBIC AND ANAEROBIC Blood Culture adequate volume   Culture  Setup Time   Final    IN BOTH AEROBIC AND ANAEROBIC BOTTLES GRAM POSITIVE COCCI IN CHAINS GRAM NEGATIVE RODS CRITICAL RESULT CALLED TO, READ BACK BY AND VERIFIED WITH: Hughie Closs [facility] 05/23/19 2006 JDW    Culture (A)  Final    KLEBSIELLA OXYTOCA GRAM POSITIVE COCCI IDENTIFICATION TO FOLLOW Performed at [facility], [place] [address], [place], [place] [address]    Report Status PENDING  Incomplete   Organism ID, Bacteria  KLEBSIELLA OXYTOCA  Final      Susceptibility   Klebsiella oxytoca - MIC*    AMPICILLIN >=32 RESISTANT Resistant     CEFAZOLIN <=4 SENSITIVE Sensitive     CEFEPIME <=1 SENSITIVE Sensitive     CEFTAZIDIME <=1 SENSITIVE Sensitive     CEFTRIAXONE <=1 SENSITIVE Sensitive     CIPROFLOXACIN <=0.25 SENSITIVE Sensitive     GENTAMICIN <=1 SENSITIVE Sensitive     IMIPENEM <=0.25 SENSITIVE Sensitive     TRIMETH/SULFA <=20 SENSITIVE Sensitive     AMPICILLIN/SULBACTAM 8 SENSITIVE Sensitive     PIP/TAZO <=4 SENSITIVE Sensitive     * KLEBSIELLA OXYTOCA  Blood Culture ID Panel (Reflexed)     Status: Abnormal   Collection Time: 05/23/19  2:48 AM  Result Value Ref Range Status   Enterococcus species NOT DETECTED NOT DETECTED Final   Listeria monocytogenes NOT DETECTED NOT DETECTED Final   Staphylococcus species NOT DETECTED NOT DETECTED Final   Staphylococcus aureus (BCID) NOT DETECTED NOT DETECTED Final   Streptococcus species DETECTED (A) NOT DETECTED Final    Comment: Not Enterococcus species, Streptococcus agalactiae, Streptococcus pyogenes, or Streptococcus pneumoniae. CRITICAL RESULT CALLED TO, READ BACK BY AND VERIFIED WITH: Hughie Closs [facility] 05/23/19 2006 JDW    Streptococcus agalactiae NOT DETECTED NOT DETECTED Final   Streptococcus pneumoniae NOT DETECTED NOT DETECTED Final   Streptococcus pyogenes NOT DETECTED NOT DETECTED Final   Acinetobacter baumannii NOT DETECTED NOT DETECTED Final   Enterobacteriaceae species DETECTED (A) NOT DETECTED Final    Comment: Enterobacteriaceae represent a large family of gram-negative bacteria, not a single organism. CRITICAL RESULT CALLED TO, READ BACK BY AND VERIFIED WITH: Hughie Closs [facility] 05/23/19 2006 JDW    Enterobacter cloacae complex NOT DETECTED NOT DETECTED Final   Escherichia coli NOT DETECTED NOT DETECTED Final   Klebsiella oxytoca DETECTED (A) NOT DETECTED Final    Comment: CRITICAL RESULT CALLED TO, READ BACK BY AND VERIFIED WITH: Hughie Closs  [facility] 05/23/19/2006 JDW    Klebsiella pneumoniae NOT DETECTED NOT DETECTED Final   Proteus species NOT DETECTED NOT DETECTED Final   Serratia marcescens NOT DETECTED NOT DETECTED Final   Carbapenem resistance NOT DETECTED NOT DETECTED Final   Haemophilus influenzae NOT DETECTED  NOT DETECTED Final   Neisseria meningitidis NOT DETECTED NOT DETECTED Final   Pseudomonas aeruginosa NOT DETECTED NOT DETECTED Final   Candida albicans NOT DETECTED NOT DETECTED Final   Candida glabrata NOT DETECTED NOT DETECTED Final   Candida krusei NOT DETECTED NOT DETECTED Final   Candida parapsilosis NOT DETECTED NOT DETECTED Final   Candida tropicalis NOT DETECTED NOT DETECTED Final    Comment: Performed at [facility], [place] [address], [place], [place] [address]  Urine culture     Status: None   Collection Time: 05/23/19  2:53 AM   Specimen: In/Out Cath Urine  Result Value Ref Range Status   Specimen Description IN/OUT CATH URINE  Final   Special Requests NONE  Final   Culture   Final    NO GROWTH Performed at [facility], [place] [address] [place], [place] [address]    Report Status 05/24/2019 FINAL  Final  Respiratory Panel by RT PCR (Flu A&B, Covid) - Nasopharyngeal Swab     Status: None   Collection Time: 05/23/19  3:19 AM   Specimen: Nasopharyngeal Swab  Result Value Ref Range Status   SARS Coronavirus 2 by RT PCR NEGATIVE NEGATIVE Final    Comment: (NOTE) SARS-CoV-2 target nucleic acids are NOT DETECTED. The SARS-CoV-2 RNA is generally detectable in upper respiratoy specimens during the acute phase of infection. The lowest concentration of SARS-CoV-2 viral copies this assay can detect is 131 copies/mL. A negative result does not preclude SARS-Cov-2 infection and should not be used as the sole basis for treatment or other patient management decisions. A negative result may occur with  improper specimen collection/handling, submission of specimen other than nasopharyngeal swab,  presence of viral mutation(s) within the areas targeted by this assay, and inadequate number of viral copies (<131 copies/mL). A negative result must be combined with clinical observations, patient history, and epidemiological information. The expected result is Negative. Fact Sheet for Patients:  PinkCheek.be Fact Sheet for Healthcare Providers:  GravelBags.it This test is not yet ap proved or cleared by the [place] FDA and  has been authorized for detection and/or diagnosis of SARS-CoV-2 by FDA under an Emergency Use Authorization (EUA). This EUA will remain  in effect (meaning this test can be used) for the duration of the COVID-19 declaration under Section 564(b)(1) of the Act, 21 U.S.C. section 360bbb-3(b)(1), unless the authorization is terminated or revoked sooner.    Influenza A by PCR NEGATIVE NEGATIVE Final   Influenza B by PCR NEGATIVE NEGATIVE Final    Comment: (NOTE) The Xpert Xpress SARS-CoV-2/FLU/RSV assay is intended as an aid in  the diagnosis of influenza from Nasopharyngeal swab specimens and  should not be used as a sole basis for treatment. Nasal washings and  aspirates are unacceptable for Xpert Xpress SARS-CoV-2/FLU/RSV  testing. Fact Sheet for Patients: PinkCheek.be Fact Sheet for Healthcare Providers: GravelBags.it This test is not yet approved or cleared by the [place] FDA and  has been authorized for detection and/or diagnosis of SARS-CoV-2 by  FDA under an Emergency Use Authorization (EUA). This EUA will remain  in effect (meaning this test can be used) for the duration of the  Covid-19 declaration under Section 564(b)(1) of the Act, 21  U.S.C. section 360bbb-3(b)(1), unless the authorization is  terminated or revoked. Performed at [facility], [place] [address], [place], [place] [address]   Body fluid culture     Status:  None (Preliminary result)   Collection Time: 05/23/19  5:12 PM   Specimen: Gallbladder; Body Fluid  Result Value Ref Range Status   Specimen Description GALL BLADDER  Final   Special Requests BILE  Final   Gram Stain   Final    ABUNDANT WBC PRESENT, PREDOMINANTLY PMN ABUNDANT GRAM POSITIVE COCCI ABUNDANT GRAM NEGATIVE RODS ABUNDANT GRAM POSITIVE RODS Performed at [facility], [place] [address], [place], [place] [address]    Culture FEW KLEBSIELLA OXYTOCA  Final   Report Status PENDING  Incomplete   Organism ID, Bacteria KLEBSIELLA OXYTOCA  Final      Susceptibility   Klebsiella oxytoca - MIC*    AMPICILLIN >=32 RESISTANT Resistant     CEFAZOLIN <=4 SENSITIVE Sensitive     CEFEPIME <=1 SENSITIVE Sensitive     CEFTAZIDIME <=1 SENSITIVE Sensitive     CEFTRIAXONE <=1 SENSITIVE Sensitive     CIPROFLOXACIN <=0.25 SENSITIVE Sensitive     GENTAMICIN <=1 SENSITIVE Sensitive     IMIPENEM <=0.25 SENSITIVE Sensitive     TRIMETH/SULFA <=20 SENSITIVE Sensitive     AMPICILLIN/SULBACTAM 4 SENSITIVE Sensitive     PIP/TAZO <=4 SENSITIVE Sensitive     * FEW KLEBSIELLA OXYTOCA    Radiology Reports IR Perc Cholecystostomy  Result Date: 05/23/2019 INDICATION: 74 year old female with acute calculus cholecystitis and acute sepsis. She is considered a high risk surgical candidate at this time and therefore presents for percutaneous cholecystostomy tube placement. EXAM: CHOLECYSTOSTOMY MEDICATIONS: Patient is currently an inpatient and receiving intravenous antibiotics. No additional antibiotic coverage was provided. ANESTHESIA/SEDATION: Moderate (conscious) sedation was employed during this procedure. A total of Versed 1 mg, 0.5 mg Dilaudid and Fentanyl 50 mcg was administered intravenously. Moderate Sedation Time: 19 minutes. The patient's level of consciousness and vital signs were monitored continuously by radiology nursing throughout the procedure under my direct supervision. FLUOROSCOPY TIME:   Fluoroscopy Time: 1 minutes 36 seconds (17 mGy). COMPLICATIONS: None immediate. PROCEDURE: Informed written consent was obtained from the patient after a thorough discussion of the procedural risks, benefits and alternatives. All questions were addressed. Maximal Sterile Barrier Technique was utilized including caps, mask, sterile gowns, sterile gloves, sterile drape, hand hygiene and skin antiseptic. A timeout was performed prior to the initiation of the procedure. The right upper quadrant was interrogated with ultrasound. The hydropic gallbladder containing multiple stones was successfully identified. A suitable skin entry site was selected and marked. The overlying skin was sterilely prepped and draped in the standard fashion using chlorhexidine skin prep. Local anesthesia was attained by infiltration with 1% lidocaine. Under real-time ultrasound guidance, a 21 gauge Accustick needle was advanced along a short transhepatic course and into the gallbladder lumen. A wire was advanced into the gallbladder lumen. The needle was exchanged for the transitional peel-away sheath which was successfully advanced into the gallbladder lumen. A gentle hand injection of contrast material opacifies the bladder lumen. An Amplatz wire was then coiled in the gallbladder. The transitional sheath was removed. The transhepatic tract was dilated to 10 [place] and a Cook 10.2 [place] all-purpose drainage catheter was advanced over the wire and formed in the gallbladder. There was copious return of foul-smelling purulent bile. Samples were aspirated and sent for Gram stain and culture. The catheter was connected to gravity bag drainage and then secured to the skin with 0 Prolene suture. Sterile bandages were applied. IMPRESSION: Successful placement of a transhepatic 10 French percutaneous cholecystostomy tube for the indication of acute calculus cholecystitis in a septic, high risk patient who is a poor candidate for surgery.  Electronically Signed   By: Jacqulynn Cadet M.D.   On: 05/23/2019 17:37   DG Chest Port 1 View  Result Date: 05/23/2019 CLINICAL DATA:  Shortness of breath, sepsis EXAM: PORTABLE CHEST 1 VIEW COMPARISON:  04/22/2019 FINDINGS: Lingular atelectasis. Right lung clear. Heart is normal size. No effusions or acute bony abnormality. IMPRESSION: Lingular atelectasis. Electronically Signed   By: Rolm Baptise M.D.   On: 05/23/2019 03:20   CT Renal Stone Study  Result Date: 05/23/2019 CLINICAL DATA:  Malaise. Diffuse abdominal pain. EXAM: CT ABDOMEN AND PELVIS WITHOUT CONTRAST TECHNIQUE: Multidetector CT imaging of the abdomen and pelvis was performed following the standard protocol without IV contrast. COMPARISON:  Abdominal ultrasound 12/06/2017 FINDINGS: Lower chest: Asymmetric right lower lobe airspace disease is present. Minimal atelectasis is present at the left base. The heart size is normal. No significant pleural or pericardial effusion is present. Hepatobiliary: Liver is within normal limits. Numerous gallstones are present. Inflammatory changes are evident about the gallbladder with gallbladder wall thickening. Common bile duct is unremarkable. Pancreas: Unremarkable. No pancreatic ductal dilatation or surrounding inflammatory changes. Spleen: Normal in size without focal abnormality. Adrenals/Urinary Tract: Adrenal glands are normal bilaterally. Kidneys and ureters are within normal limits. No stone or mass lesion is present. Urinary bladder is normal, collapsed. Stomach/Bowel: Inflammatory changes are noted in the gastric antrum and proximal duodenum. No discrete ulcer or abscess is present. Small bowel is within normal limits. Terminal ileum is normal. Inflammatory changes are present at the hepatic flexure. Transverse colon is normal. Descending and sigmoid colon are normal. Vascular/Lymphatic: Atherosclerotic changes are noted in the aorta branch vessels without aneurysm. Reproductive: Fibroid uterus  is present. Uterus and adnexa are otherwise within limits. Other: No abdominal wall hernia or abnormality. No abdominopelvic ascites. Musculoskeletal: Degenerative changes are present the lumbar spine. Facet hypertrophy contributes to foraminal stenosis bilaterally at L5-S1. Vertebral body heights are maintained. No focal lytic or blastic lesions are present. Bony pelvis is within normal limits. The hips are located and within limits bilaterally. IMPRESSION: 1. Inflammatory changes about the gallbladder with gallbladder wall thickening and cholelithiasis compatible with acute cholecystitis. 2. Diffuse inflammatory changes are also present at the gastric antrum and duodenum. This may represent acute duodenitis versus cholecystitis. 3. Inflammatory changes at the hepatic flexure of the colon are likely secondary. 4. Asymmetric right lower lobe airspace disease concerning for pneumonia or aspiration. 5. Fibroid uterus. 6. Aortic Atherosclerosis (ICD10-I70.0). Electronically Signed   By: San Morelle M.D.   On: 05/23/2019 04:54     Phillips Climes M.D on 05/25/2019 at 4:25 PM  Between 7am to 7pm - Pager - [phone]  After 7pm go to [email or web address] - password [facility]  [facility] -  Office  [phone]

## 2019-05-25 NOTE — Progress Notes (Signed)
   05/25/19 2037  Assess: MEWS Score  Temp 98 F (36.7 C)  BP (!) 161/86  Pulse Rate 72  ECG Heart Rate 72  Resp (!) 21  Level of Consciousness Alert  SpO2 96 %  O2 Device Room Air  Patient Activity (if Appropriate) In bed  Assess: MEWS Score  MEWS Temp 0  MEWS Systolic 0  MEWS Pulse 0  MEWS RR 1  MEWS LOC 0  MEWS Score 1  MEWS Score Color Green  Assess: if the MEWS score is Yellow or Red  Were vital signs taken at a resting state? Yes  Focused Assessment Documented focused assessment  Early Detection of Sepsis Score *See Row Information* Low  MEWS guidelines implemented *See Row Information* No, other (Comment) (no acute changes)

## 2019-05-25 NOTE — Progress Notes (Signed)
    CC:  Sepsis  Subjective: Pt alert sitting up in bed has eaten breakfast.  NO abdominal complaints. She has a IR drain that is currently mostly serous fluid.   Objective: Vital signs in last 24 hours: Temp:  [97.9 F (36.6 C)-99.3 F (37.4 C)] 99.3 F (37.4 C) (04/24 0834) Pulse Rate:  [57-90] 80 (04/24 0739) Resp:  [19-22] 20 (04/24 0739) BP: (101-145)/(65-94) 101/83 (04/24 0739) SpO2:  [96 %-100 %] 96 % (04/24 0739) Last BM Date: 05/24/19 IV 495 PO not recorded Urine 500 Drain 75 recorded Afebrile, VSS K+ 3.2  WBC 11.7 Intake/Output from previous day: 04/23 0701 - 04/24 0700 In: 495.6 [I.V.:5; IV Piggyback:490.6] Out: 575 [Urine:500; Drains:75] Intake/Output this shift: Total I/O In: -  Out: 800 [Urine:800]  General appearance: alert, cooperative and no distress Resp: clear to auscultation bilaterally and anterior exam GI: soft, a little sore around the IR drain.  NO other abdominal complaints.    Lab Results:  Recent Labs    05/24/19 0759 05/25/19 0354  WBC 14.5* 11.7*  HGB 13.4 11.6*  HCT 41.9 35.6*  PLT 144* 172    BMET Recent Labs    05/24/19 0759 05/25/19 0354  NA 139 141  K 4.2 3.2*  CL 112* 114*  CO2 16* 20*  GLUCOSE 114* 100*  BUN 17 14  CREATININE 1.12* 0.87  CALCIUM 8.3* 8.3*   PT/INR Recent Labs    05/23/19 0253  LABPROT 15.5*  INR 1.2    Recent Labs  Lab 05/23/19 0253 05/24/19 0759 05/25/19 0354  AST 25 31 16   ALT 18 26 16   ALKPHOS 180* 64 61  BILITOT 1.1 1.5* 0.6  PROT 7.3 6.3* 5.8*  ALBUMIN 3.0* 2.3* 2.1*     Lipase     Component Value Date/Time   LIPASE 23 12/07/2017 0524     Medications: . heparin  5,000 Units Subcutaneous Q8H  . potassium chloride  40 mEq Oral Q6H  . sodium chloride flush  3 mL Intravenous Q12H  . sodium chloride flush  5 mL Intracatheter Q8H    Assessment/Plan RLL pneumonia AKI Hx asthma Hypokalemia Hx of hypertension  Sepsis Acute cholecystitis - S/P IR drain placement  05/23/19  FEN:  IV fluids/regular diet ID:  Rocephin/Flagyl 4/22 DVT:  Heparin Follow up:  Dr. Georganna Skeans  Plan:  She can follow up with the IR clinic for the drain and then set up an appointment with Dr. Grandville Silos in 6 weeks.  I will place info in the AVS.     LOS: 2 days    Gabriella Butler 05/25/2019 Please see Amion

## 2019-05-25 NOTE — Progress Notes (Signed)
Referring Physician(s): Thompson,B  Supervising Physician: Jacqulynn Cadet  Patient Status:  [facility] - In-pt  Chief Complaint:  Abdominal pain/cholecystitis  Subjective: Pt doing ok this AM; denies worsening abd pain,N/V   Allergies: Patient has no known allergies.  Medications: Prior to Admission medications   Medication Sig Start Date End Date Taking? Authorizing Provider  albuterol (VENTOLIN HFA) 108 (90 Base) MCG/ACT inhaler Inhale 1-2 puffs into the lungs every 4 (four) hours as needed for wheezing or shortness of breath. Patient not taking: Reported on 05/23/2019 04/23/19   Sherwood Gambler, MD  beclomethasone (QVAR) 40 MCG/ACT inhaler Inhale 1 puff into the lungs 2 (two) times daily. Patient not taking: Reported on 04/22/2019 02/04/17   Dorie Rank, MD  Cetirizine HCl 10 MG CAPS Take 1 capsule (10 mg total) by mouth daily for 10 days. Patient not taking: Reported on 12/06/2017 07/12/17 07/22/17  Wieters, Hallie C, PA-C  ondansetron (ZOFRAN) 4 MG tablet Take 1 tablet (4 mg total) by mouth every 6 (six) hours as needed for nausea. Patient not taking: Reported on 04/22/2019 12/07/17   Raiford Noble Latif, DO  traMADol (ULTRAM) 50 MG tablet Take 1 tablet (50 mg total) by mouth every 6 (six) hours as needed. Patient not taking: Reported on 12/06/2017 02/04/17   Dorie Rank, MD     Vital Signs: BP 101/83 (BP Location: Right Arm)   Pulse 80   Temp 99.3 F (37.4 C)   Resp 20   Ht 5\' 5"  (1.651 m)   Wt 200 lb 9.9 oz (91 kg)   SpO2 96%   BMI 33.38 kg/m   Physical Exam awake/alert; GB drain intact, insertion site mildly tender, OP 75 cc serosang bile; drain flushed without difficulty  Imaging: IR Perc Cholecystostomy  Result Date: 05/23/2019 INDICATION: 74 year old female with acute calculus cholecystitis and acute sepsis. She is considered a high risk surgical candidate at this time and therefore presents for percutaneous cholecystostomy tube placement. EXAM: CHOLECYSTOSTOMY  MEDICATIONS: Patient is currently an inpatient and receiving intravenous antibiotics. No additional antibiotic coverage was provided. ANESTHESIA/SEDATION: Moderate (conscious) sedation was employed during this procedure. A total of Versed 1 mg, 0.5 mg Dilaudid and Fentanyl 50 mcg was administered intravenously. Moderate Sedation Time: 19 minutes. The patient's level of consciousness and vital signs were monitored continuously by radiology nursing throughout the procedure under my direct supervision. FLUOROSCOPY TIME:  Fluoroscopy Time: 1 minutes 36 seconds (17 mGy). COMPLICATIONS: None immediate. PROCEDURE: Informed written consent was obtained from the patient after a thorough discussion of the procedural risks, benefits and alternatives. All questions were addressed. Maximal Sterile Barrier Technique was utilized including caps, mask, sterile gowns, sterile gloves, sterile drape, hand hygiene and skin antiseptic. A timeout was performed prior to the initiation of the procedure. The right upper quadrant was interrogated with ultrasound. The hydropic gallbladder containing multiple stones was successfully identified. A suitable skin entry site was selected and marked. The overlying skin was sterilely prepped and draped in the standard fashion using chlorhexidine skin prep. Local anesthesia was attained by infiltration with 1% lidocaine. Under real-time ultrasound guidance, a 21 gauge Accustick needle was advanced along a short transhepatic course and into the gallbladder lumen. A wire was advanced into the gallbladder lumen. The needle was exchanged for the transitional peel-away sheath which was successfully advanced into the gallbladder lumen. A gentle hand injection of contrast material opacifies the bladder lumen. An Amplatz wire was then coiled in the gallbladder. The transitional sheath was removed. The transhepatic tract was  dilated to 10 [place] and a Cook 10.2 [place] all-purpose drainage catheter was  advanced over the wire and formed in the gallbladder. There was copious return of foul-smelling purulent bile. Samples were aspirated and sent for Gram stain and culture. The catheter was connected to gravity bag drainage and then secured to the skin with 0 Prolene suture. Sterile bandages were applied. IMPRESSION: Successful placement of a transhepatic 10 French percutaneous cholecystostomy tube for the indication of acute calculus cholecystitis in a septic, high risk patient who is a poor candidate for surgery. Electronically Signed   By: Jacqulynn Cadet M.D.   On: 05/23/2019 17:37   DG Chest Port 1 View  Result Date: 05/23/2019 CLINICAL DATA:  Shortness of breath, sepsis EXAM: PORTABLE CHEST 1 VIEW COMPARISON:  04/22/2019 FINDINGS: Lingular atelectasis. Right lung clear. Heart is normal size. No effusions or acute bony abnormality. IMPRESSION: Lingular atelectasis. Electronically Signed   By: Rolm Baptise M.D.   On: 05/23/2019 03:20   CT Renal Stone Study  Result Date: 05/23/2019 CLINICAL DATA:  Malaise. Diffuse abdominal pain. EXAM: CT ABDOMEN AND PELVIS WITHOUT CONTRAST TECHNIQUE: Multidetector CT imaging of the abdomen and pelvis was performed following the standard protocol without IV contrast. COMPARISON:  Abdominal ultrasound 12/06/2017 FINDINGS: Lower chest: Asymmetric right lower lobe airspace disease is present. Minimal atelectasis is present at the left base. The heart size is normal. No significant pleural or pericardial effusion is present. Hepatobiliary: Liver is within normal limits. Numerous gallstones are present. Inflammatory changes are evident about the gallbladder with gallbladder wall thickening. Common bile duct is unremarkable. Pancreas: Unremarkable. No pancreatic ductal dilatation or surrounding inflammatory changes. Spleen: Normal in size without focal abnormality. Adrenals/Urinary Tract: Adrenal glands are normal bilaterally. Kidneys and ureters are within normal limits. No  stone or mass lesion is present. Urinary bladder is normal, collapsed. Stomach/Bowel: Inflammatory changes are noted in the gastric antrum and proximal duodenum. No discrete ulcer or abscess is present. Small bowel is within normal limits. Terminal ileum is normal. Inflammatory changes are present at the hepatic flexure. Transverse colon is normal. Descending and sigmoid colon are normal. Vascular/Lymphatic: Atherosclerotic changes are noted in the aorta branch vessels without aneurysm. Reproductive: Fibroid uterus is present. Uterus and adnexa are otherwise within limits. Other: No abdominal wall hernia or abnormality. No abdominopelvic ascites. Musculoskeletal: Degenerative changes are present the lumbar spine. Facet hypertrophy contributes to foraminal stenosis bilaterally at L5-S1. Vertebral body heights are maintained. No focal lytic or blastic lesions are present. Bony pelvis is within normal limits. The hips are located and within limits bilaterally. IMPRESSION: 1. Inflammatory changes about the gallbladder with gallbladder wall thickening and cholelithiasis compatible with acute cholecystitis. 2. Diffuse inflammatory changes are also present at the gastric antrum and duodenum. This may represent acute duodenitis versus cholecystitis. 3. Inflammatory changes at the hepatic flexure of the colon are likely secondary. 4. Asymmetric right lower lobe airspace disease concerning for pneumonia or aspiration. 5. Fibroid uterus. 6. Aortic Atherosclerosis (ICD10-I70.0). Electronically Signed   By: San Morelle M.D.   On: 05/23/2019 04:54    Labs:  CBC: Recent Labs    04/22/19 2157 04/22/19 2157 05/23/19 0253 05/23/19 0433 05/24/19 0759 05/25/19 0354  WBC 6.1  --  10.2  --  14.5* 11.7*  HGB 13.6   < > 14.9 13.6 13.4 11.6*  HCT 43.9   < > 45.7 40.0 41.9 35.6*  PLT 254  --  208  --  144* 172   < > = values in  this interval not displayed.    COAGS: Recent Labs    05/23/19 0253  INR 1.2    APTT 28    BMP: Recent Labs    04/22/19 2157 04/22/19 2157 05/23/19 0253 05/23/19 0433 05/24/19 0759 05/25/19 0354  NA 141   < > 135 138 139 141  K 4.0   < > 3.3* 2.8* 4.2 3.2*  CL 108  --  104  --  112* 114*  CO2 26  --  17*  --  16* 20*  GLUCOSE 116*  --  186*  --  114* 100*  BUN 10  --  24*  --  17 14  CALCIUM 10.0  --  9.4  --  8.3* 8.3*  CREATININE 0.98  --  1.77*  --  1.12* 0.87  GFRNONAA 58*  --  28*  --  49* >60  GFRAA >60  --  33*  --  57* >60   < > = values in this interval not displayed.    LIVER FUNCTION TESTS: Recent Labs    04/22/19 2157 05/23/19 0253 05/24/19 0759 05/25/19 0354  BILITOT 0.5 1.1 1.5* 0.6  AST 20 25 31 16   ALT 18 18 26 16   ALKPHOS 61 180* 64 61  PROT 8.1 7.3 6.3* 5.8*  ALBUMIN 3.9 3.0* 2.3* 2.1*    Assessment and Plan: Pt with hx acute calculus cholecystitis/sepsis; poor surg candidate; s/p perc GB drain 4/22; temp 99.3, WBC 11.7(14.5), hgb 11.6, creat nl; K 3.2- replace; bile cx- klebsiella; cont drain irrigation; GB drain will need to remain in place at least 4-6 weeks unless GB removed surgically in interim; will schedule for f/u GB drain injection in IR clinic in 4 weeks; other plans as per CCS/TRH   Electronically Signed: D. Rowe Robert, PA-C 05/25/2019, 11:22 AM   I spent a total of 15 minutes at the the patient's bedside AND on the patient's hospital floor or unit, greater than 50% of which was counseling/coordinating care for gallbladder drain    Patient ID: Gabriella Butler, female   DOB: 1945/09/04, 74 y.o.   MRN: [id number]

## 2019-05-25 NOTE — Discharge Instructions (Signed)
Cholecystitis  Cholecystitis is irritation and swelling (inflammation) of the gallbladder. The gallbladder is an organ that is shaped like a pear. It is under the liver on the right side of the body. This organ stores bile. Bile helps the body break down (digest) the fats in food. This condition can occur all of a sudden. It needs to be treated. What are the causes? This condition may be caused by stones or lumps that form in the gallbladder (gallstones). Gallstones can block the tube (duct) that carries bile out of your gallbladder. Other causes are:  Damage to the gallbladder due to less blood flow.  Germs in the bile ducts.  Scars or kinks in the bile ducts.  Abnormal growths (tumors) in the liver, pancreas, or gallbladder. What increases the risk? You are more likely to develop this condition if:  You have sickle cell disease.  You take birth control pills.  You use estrogen.  You have alcoholic liver disease.  You have liver cirrhosis.  You are being fed through a vein.  You are very ill.  You do not eat or drink for a long time. This is also called "fasting."  You are overweight (obese).  You lose weight too fast.  You are pregnant.  You have high levels of fat in the blood (triglycerides).  You have irritation and swelling of the pancreas (pancreatitis). What are the signs or symptoms? Symptoms of this condition include:  Pain in the belly (abdomen). Pain is often in the upper right area of the belly.  Tenderness or bloating in the belly.  Feeling sick to your stomach (nauseous).  Throwing up (vomiting).  Fever.  Chills. How is this diagnosed? This condition may be diagnosed with a medical history and exam. You may also have other tests, such as:  Imaging tests. This may include: ? Ultrasound. ? CT scan of the belly. ? Nuclear scan. This is also called a HIDA scan. This scan lets your doctor see the bile as it moves in your body. ? MRI.  Blood  tests. These are done to check: ? Your blood count. The white blood cell count may be higher than normal. ? How well your liver works. How is this treated? This condition may be treated with:  Surgery to take out your gallbladder.  Antibiotic medicines to treat illnesses caused by germs.  Going without food for some time.  Giving fluids through an IV tube.  Medicines to treat pain or throwing up. Follow these instructions at home:  If you had surgery, follow instructions from your doctor about how to care for yourself after you go home. Medicines   Take over-the-counter and prescription medicines only as told by your doctor.  If you were prescribed an antibiotic medicine, take it as told by your doctor. Do not stop taking it even if you start to feel better. General instructions  Follow instructions from your doctor about what to eat or drink. Do not eat or drink anything that makes you sick again.  Do not lift anything that is heavier than 10 lb (4.5 kg) until your doctor says that it is safe.  Do not use any products that contain nicotine or tobacco, such as cigarettes and e-cigarettes. If you need help quitting, ask your doctor.  Keep all follow-up visits as told by your doctor. This is important. Contact a doctor if:  You have pain and your medicine does not help.  You have a fever. Get help right away if:  Your pain moves to: ? Another part of your belly. ? Your back.  Your symptoms do not go away.  You have new symptoms. Summary  Cholecystitis is swelling and irritation of the gallbladder.  This condition may be caused by stones or lumps that form in the gallbladder (gallstones).  Common symptoms are pain in the belly. You may feel sick to your stomach and start throwing up. You may also have a fever and chills.  This condition may be treated with surgery to take out the gallbladder. It may also be treated with medicines, fasting, and fluids through an IV  tube.  Follow what you are told about eating and drinking. Do not eat things that make you sick again. This information is not intended to replace advice given to you by your health care provider. Make sure you discuss any questions you have with your health care provider. Document Revised: 05/26/2017 Document Reviewed: 05/26/2017 Elsevier Patient Education  [place].   Cholecystitis  Cholecystitis is irritation and swelling (inflammation) of the gallbladder. The gallbladder is an organ that is shaped like a pear. It is under the liver on the right side of the body. This organ stores bile. Bile helps the body break down (digest) the fats in food. This condition can occur all of a sudden. It needs to be treated. What are the causes? This condition may be caused by stones or lumps that form in the gallbladder (gallstones). Gallstones can block the tube (duct) that carries bile out of your gallbladder. Other causes are:  Damage to the gallbladder due to less blood flow.  Germs in the bile ducts.  Scars or kinks in the bile ducts.  Abnormal growths (tumors) in the liver, pancreas, or gallbladder. What increases the risk? You are more likely to develop this condition if:  You have sickle cell disease.  You take birth control pills.  You use estrogen.  You have alcoholic liver disease.  You have liver cirrhosis.  You are being fed through a vein.  You are very ill.  You do not eat or drink for a long time. This is also called "fasting."  You are overweight (obese).  You lose weight too fast.  You are pregnant.  You have high levels of fat in the blood (triglycerides).  You have irritation and swelling of the pancreas (pancreatitis). What are the signs or symptoms? Symptoms of this condition include:  Pain in the belly (abdomen). Pain is often in the upper right area of the belly.  Tenderness or bloating in the belly.  Feeling sick to your stomach  (nauseous).  Throwing up (vomiting).  Fever.  Chills. How is this diagnosed? This condition may be diagnosed with a medical history and exam. You may also have other tests, such as:  Imaging tests. This may include: ? Ultrasound. ? CT scan of the belly. ? Nuclear scan. This is also called a HIDA scan. This scan lets your doctor see the bile as it moves in your body. ? MRI.  Blood tests. These are done to check: ? Your blood count. The white blood cell count may be higher than normal. ? How well your liver works. How is this treated? This condition may be treated with:  Surgery to take out your gallbladder.  Antibiotic medicines to treat illnesses caused by germs.  Going without food for some time.  Giving fluids through an IV tube.  Medicines to treat pain or throwing up. Follow these instructions at home:  If you had surgery, follow instructions from your doctor about how to care for yourself after you go home. Medicines   Take over-the-counter and prescription medicines only as told by your doctor.  If you were prescribed an antibiotic medicine, take it as told by your doctor. Do not stop taking it even if you start to feel better. General instructions  Follow instructions from your doctor about what to eat or drink. Do not eat or drink anything that makes you sick again.  Do not lift anything that is heavier than 10 lb (4.5 kg) until your doctor says that it is safe.  Do not use any products that contain nicotine or tobacco, such as cigarettes and e-cigarettes. If you need help quitting, ask your doctor.  Keep all follow-up visits as told by your doctor. This is important. Contact a doctor if:  You have pain and your medicine does not help.  You have a fever. Get help right away if:  Your pain moves to: ? Another part of your belly. ? Your back.  Your symptoms do not go away.  You have new symptoms. Summary  Cholecystitis is swelling and irritation  of the gallbladder.  This condition may be caused by stones or lumps that form in the gallbladder (gallstones).  Common symptoms are pain in the belly. You may feel sick to your stomach and start throwing up. You may also have a fever and chills.  This condition may be treated with surgery to take out the gallbladder. It may also be treated with medicines, fasting, and fluids through an IV tube.  Follow what you are told about eating and drinking. Do not eat things that make you sick again. This information is not intended to replace advice given to you by your health care provider. Make sure you discuss any questions you have with your health care provider. Document Revised: 05/26/2017 Document Reviewed: 05/26/2017 Elsevier Patient Education  [place] This sheet gives you information about how to care for yourself after your procedure. Your health care provider may also give you more specific instructions. If you have problems or questions, contact your health care provider. What can I expect after the procedure? After your procedure, it is common to have soreness near the incision site of your drainage tube (catheter). Follow these instructions at home: Incision care   Follow instructions from your health care provider about how to take care of your incision site where the catheter was inserted. Make sure you: ? Wash your hands with soap and water before and after you change your bandage (dressing). If soap and water are not available, use hand sanitizer. ? Change your dressing as told by your health care provider.  Check the incision site every day for signs of infection. Check for: ? Redness, swelling, or pain. ? Fluid or blood. ? Warmth. ? Pus or a bad smell.  Do not take baths, swim, or use a hot tub until your health care provider approves. Ask your health care provider if you may take showers. You may only be allowed to take sponge  baths. General instructions  Follow instructions from your health care provider about how to care for your catheter and collection bag at home.  Your health care provider will show you: ? How to record the amount of drainage from the catheter. ? How to flush the catheter. ? How to care for the catheter incision site.  Follow instructions from your health  care provider about eating or drinking restrictions.  Take over-the-counter and prescription medicines only as told by your health care provider.  Keep all follow-up visits as told by your health care provider. This is important. Contact a health care provider if:  You have redness, swelling, or pain around the catheter incision site.  You have nausea or vomiting. Get help right away if:  Your abdominal pain gets worse.  You feel dizzy or you faint while standing.  You have fluid or blood coming from the catheter incision site.  The area around the catheter incision site feels warm to the touch.  You have pus or a bad smell coming from the catheter incision site.  You have a fever.  You have shortness of breath.  You have a rapid heartbeat.  Your nausea or vomiting does not go away.  Your catheter becomes blocked.  Your catheter comes out of your abdomen. Summary  After your procedure, it is common to have soreness near the incision site of your drainage tube (catheter).  Wash your hands with soap and water before and after you change your bandage (dressing). Change your dressing as told by your health care provider.  Check the catheter incision site every day for signs of infection. Check for redness, swelling, pain, fluid, blood, warmth, pus, or a bad smell.  Contact your health care provider if you have nausea or vomiting, or if you have redness, swelling, or pain around your catheter incision site.  Get help right away if your abdominal pain gets worse, you feel dizzy, you have blood or fluid coming from the  catheter incision site, you have a fever, or you have shortness of breath. This information is not intended to replace advice given to you by your health care provider. Make sure you discuss any questions you have with your health care provider. Document Revised: 08/14/2017 Document Reviewed: 08/14/2017 Elsevier Patient Education  [place].

## 2019-05-25 NOTE — Plan of Care (Signed)

## 2019-05-26 ENCOUNTER — Inpatient Hospital Stay: Payer: Self-pay

## 2019-05-26 DIAGNOSIS — J690 Pneumonitis due to inhalation of food and vomit: Secondary | ICD-10-CM

## 2019-05-26 LAB — BODY FLUID CULTURE

## 2019-05-26 LAB — MRSA PCR SCREENING: MRSA by PCR: NEGATIVE

## 2019-05-26 MED ORDER — POLYETHYLENE GLYCOL 3350 17 G PO PACK
17.0000 g | PACK | Freq: Two times a day (BID) | ORAL | Status: AC
  Administered 2019-05-26 – 2019-05-27 (×3): 17 g via ORAL
  Filled 2019-05-26 (×3): qty 1

## 2019-05-26 NOTE — Plan of Care (Signed)

## 2019-05-26 NOTE — Progress Notes (Signed)
   05/26/19 1952  Assess: MEWS Score  Temp 98.6 F (37 C)  BP (!) 162/78  Pulse Rate 81  ECG Heart Rate 80  Resp 15  Level of Consciousness Alert  SpO2 96 %  O2 Device Room Air  Patient Activity (if Appropriate) In bed  Assess: MEWS Score  MEWS Temp 0  MEWS Systolic 0  MEWS Pulse 0  MEWS RR 0  MEWS LOC 0  MEWS Score 0  MEWS Score Color Green  Assess: if the MEWS score is Yellow or Red  Were vital signs taken at a resting state? Yes  Focused Assessment Documented focused assessment  Early Detection of Sepsis Score *See Row Information* Low  MEWS guidelines implemented *See Row Information* No, other (Comment) (no acute changes)

## 2019-05-26 NOTE — Progress Notes (Signed)
PROGRESS NOTE                                                                                                                                                                                                             Patient Demographics:    Gabriella Butler, is a 74 y.o. female, DOB - 07-26-1945, ZX:[id number]  Admit date - 05/23/2019   Admitting Physician Norval Morton, MD  Outpatient Primary MD for the patient is Patient, No Pcp Per  LOS - 3   Chief Complaint  Patient presents with  . Tachycardia  . Hypotension  . Lethargic       Brief Narrative    74 y.o. female with medical history significant of hypertension, asthma with bronchitis, gout, cholelithiasis, and gallstone pancreatitis presents with complaints of abdominal pain , her work-up significant for acute cholecystitis, patient with hospitalization couple years ago significant for cholelithiasis, and acute pancreatitis, but she did not follow-up as an outpatient, she did not want to proceed with surgery when.  He presents this admission with sepsis secondary to acute cholecystitis/bacteremia , status post IR percutaneous cholecystostomy drain, surgery is following.   Subjective:    Gabriella Butler today still reports some constipation despite stool softeners, no nausea, no vomiting, abdominal pain is minimal.   Assessment  & Plan :    Principal Problem:   Sepsis ([place]) Active Problems:   Cholelithiasis with acute cholecystitis   Asthma   Hypomagnesemia   Hypokalemia   Obesity (BMI 30-39.9)   History of prediabetes   Right lower lobe pneumonia   Sepsis secondary to cholelithiasis with acute cholecystitis/bacteremia: -Is present on admission with fever up to 103.3 F, tachycardic, tachypneic, and hypotensive.  Labs significant for WBC 10.2 with signs of left shift and lactic acid elevated up to 3.8.   - CT revealed gallbladder wall thickening and cholelithiasis concerning for acute  cholecystitis.  General surgery have been formally consulted, recommendation for percutaneous cholecystectomy drain.  Placed by IR 4/22. -continue  with pain control. -Regular diet. -Blood culture growing Klebsiella oxytoca, and Streptococcus Anginosis, her percutaneous cholecystostomy drain growing same organisms as well.. -IV Rocephin and Flagyl and adjust antibiotics as needed -Appreciate general surgery and IR consultative services  Right lower lobe pneumonia:  - Acute.  Patient reports having intermittent productive cough.   -Antibiotics as seen above -  Was encouraged use incentive spirometry  Acute kidney injury:  Patient presents with creatinine elevated up to 1.77 with BUN 24.  Baseline previously noted to be 0.98 just 55-month ago. -Improving with IV fluids  Hyperglycemia, history of prediabetes: -  Acute.  Glucose on admission elevated to 186.  Last hemoglobin A1c noted to be 6.2 in 12/2017. - A1c is 6.6 -Consider placing on sliding scale insulin if needed  Hypokalemia:  -Sodium low at 3.2 today, repleted  Hypomagnesemia:  - repleted  Elevated alkaline phosphatase:  - Trending down.  History of asthma:  - Patient currently not wheezing.  Appears to have not been taking Qvar or albuterol at home. -Albuterol as needed for shortness of breath/wheezing  Obesity: BMI 33.38 kg/m    COVID-19 Labs  No results for input(s): DDIMER, FERRITIN, LDH, CRP in the last 72 hours.  Lab Results  Component Value Date   SARSCOV2NAA NEGATIVE 05/23/2019   [place] NEGATIVE 04/23/2019     Code Status : Full   Family Communication  : None at bedside today  Disposition Plan  :  Status is: Inpatient  Remains inpatient appropriate because:IV treatments appropriate due to intensity of illness or inability to take PO   Dispo: The patient is from: Home              Anticipated d/c is to: Home              Anticipated d/c date is: 2 days              Patient  currently is not medically stable to d/c.   Consults  :  General surgery  Procedures  : Percutaneous cholecystostomy drain by IR 4/22  DVT Prophylaxis  :   [place] heparin   Lab Results  Component Value Date   PLT 172 05/25/2019    Antibiotics  :    Anti-infectives (From admission, onward)   Start     Dose/Rate Route Frequency Ordered Stop   05/23/19 2200  vancomycin (VANCOREADY) IVPB 750 mg/150 mL  Status:  Discontinued     750 mg 150 mL/hr over 60 Minutes Intravenous Every 24 hours 05/23/19 0518 05/23/19 2136   05/23/19 2200  cefTRIAXone (ROCEPHIN) 2 g in sodium chloride 0.9 % 100 mL IVPB     2 g 200 mL/hr over 30 Minutes Intravenous Every 24 hours 05/23/19 2136     05/23/19 1200  metroNIDAZOLE (FLAGYL) IVPB 500 mg     500 mg 100 mL/hr over 60 Minutes Intravenous Every 8 hours 05/23/19 0617     05/23/19 1000  ceFEPIme (MAXIPIME) 2 g in sodium chloride 0.9 % 100 mL IVPB  Status:  Discontinued     2 g 200 mL/hr over 30 Minutes Intravenous Every 12 hours 05/23/19 0518 05/23/19 2136   05/23/19 0300  ceFEPIme (MAXIPIME) 2 g in sodium chloride 0.9 % 100 mL IVPB     2 g 200 mL/hr over 30 Minutes Intravenous  Once 05/23/19 0253 05/23/19 0340   05/23/19 0300  metroNIDAZOLE (FLAGYL) IVPB 500 mg     500 mg 100 mL/hr over 60 Minutes Intravenous  Once 05/23/19 0253 05/23/19 0408   05/23/19 0300  vancomycin (VANCOCIN) IVPB 1000 mg/200 mL premix     1,000 mg 200 mL/hr over 60 Minutes Intravenous  Once 05/23/19 0253 05/23/19 0451        Objective:   Vitals:   05/26/19 0004 05/26/19 0354 05/26/19 0913 05/26/19 1149  BP: (!) 163/78 (!) 166/87 Marland Kitchen)  150/82 (!) 144/97  Pulse: 79 72 86 70  Resp: 18 20 17  (!) 23  Temp: 97.7 F (36.5 C) 98 F (36.7 C) 98 F (36.7 C) 97.9 F (36.6 C)  TempSrc: Oral Oral Oral Oral  SpO2: 96% 93% 96% 97%  Weight:      Height:        Wt Readings from Last 3 Encounters:  05/23/19 91 kg  04/22/19 90.7 kg  12/06/17 90.7 kg     Intake/Output Summary  (Last 24 hours) at 05/26/2019 1426 Last data filed at 05/26/2019 [id number] Gross per 24 hour  Intake 677.56 ml  Output 725 ml  Net -47.44 ml     Physical Exam  Awake Alert, Oriented X 3, No new F.N deficits, Normal affect Symmetrical Chest wall movement, Good air movement bilaterally, CTAB RRR,No Gallops,Rubs or new Murmurs, No Parasternal Heave +ve B.Sounds, Abd Soft, No tenderness, No rebound - guarding or rigidity. No Cyanosis, Clubbing or edema, No new Rash or bruise       Data Review:    CBC Recent Labs  Lab 05/23/19 0253 05/23/19 0433 05/24/19 0759 05/25/19 0354  WBC 10.2  --  14.5* 11.7*  HGB 14.9 13.6 13.4 11.6*  HCT 45.7 40.0 41.9 35.6*  PLT 208  --  144* 172  MCV 89.3  --  91.5 90.8  MCH 29.1  --  29.3 29.6  MCHC 32.6  --  32.0 32.6  RDW 13.6  --  14.5 14.6  LYMPHSABS 0.8  --   --   --   MONOABS 0.1  --   --   --   EOSABS 0.0  --   --   --   BASOSABS 0.1  --   --   --     Chemistries  Recent Labs  Lab 05/23/19 0253 05/23/19 0433 05/23/19 0530 05/24/19 0759 05/25/19 0354  NA 135 138  --  139 141  K 3.3* 2.8*  --  4.2 3.2*  CL 104  --   --  112* 114*  CO2 17*  --   --  16* 20*  GLUCOSE 186*  --   --  114* 100*  BUN 24*  --   --  17 14  CREATININE 1.77*  --   --  1.12* 0.87  CALCIUM 9.4  --   --  8.3* 8.3*  MG  --   --  1.6* 1.7  --   AST 25  --   --  31 16  ALT 18  --   --  26 16  ALKPHOS 180*  --   --  64 61  BILITOT 1.1  --   --  1.5* 0.6   ------------------------------------------------------------------------------------------------------------------ No results for input(s): CHOL, HDL, LDLCALC, TRIG, CHOLHDL, LDLDIRECT in the last 72 hours.  Lab Results  Component Value Date   HGBA1C 6.6 (H) 05/24/2019   ------------------------------------------------------------------------------------------------------------------ No results for input(s): TSH, T4TOTAL, T3FREE, THYROIDAB in the last 72 hours.  Invalid input(s):  FREET3 ------------------------------------------------------------------------------------------------------------------ No results for input(s): VITAMINB12, FOLATE, FERRITIN, TIBC, IRON, RETICCTPCT in the last 72 hours.  Coagulation profile Recent Labs  Lab 05/23/19 0253  INR 1.2    No results for input(s): DDIMER in the last 72 hours.  Cardiac Enzymes No results for input(s): CKMB, TROPONINI, MYOGLOBIN in the last 168 hours.  Invalid input(s): CK ------------------------------------------------------------------------------------------------------------------    Component Value Date/Time   BNP 59.2 04/22/2019 2157    Inpatient Medications  Scheduled Meds: . heparin  5,000  Units Subcutaneous Q8H  . senna-docusate  2 tablet Oral BID  . sodium chloride flush  10-40 mL Intracatheter Q12H  . sodium chloride flush  3 mL Intravenous Q12H  . sodium chloride flush  5 mL Intracatheter Q8H   Continuous Infusions: . sodium chloride 75 mL/hr at 05/25/19 2051  . sodium chloride 250 mL (05/25/19 2249)  . cefTRIAXone (ROCEPHIN)  IV 2 g (05/25/19 2253)  . metronidazole 500 mg (05/26/19 0636)   PRN Meds:.sodium chloride, acetaminophen **OR** acetaminophen, albuterol, morphine injection, ondansetron **OR** ondansetron (ZOFRAN) IV, sodium chloride flush  Micro Results Recent Results (from the past 240 hour(s))  Blood Culture (routine x 2)     Status: Abnormal (Preliminary result)   Collection Time: 05/23/19  2:48 AM   Specimen: BLOOD  Result Value Ref Range Status   Specimen Description BLOOD RIGHT ANTECUBITAL  Final   Special Requests   Final    BOTTLES DRAWN AEROBIC AND ANAEROBIC Blood Culture adequate volume   Culture  Setup Time   Final    IN BOTH AEROBIC AND ANAEROBIC BOTTLES GRAM POSITIVE COCCI IN CHAINS GRAM NEGATIVE RODS CRITICAL RESULT CALLED TO, READ BACK BY AND VERIFIED WITH: Hughie Closs [facility] 05/23/19 2006 JDW    Culture (A)  Final    KLEBSIELLA OXYTOCA STREPTOCOCCUS  ANGINOSIS SUSCEPTIBILITIES TO FOLLOW Performed at [facility], [address], [place], [place] [address]    Report Status PENDING  Incomplete   Organism ID, Bacteria KLEBSIELLA OXYTOCA  Final      Susceptibility   Klebsiella oxytoca - MIC*    AMPICILLIN >=32 RESISTANT Resistant     CEFAZOLIN <=4 SENSITIVE Sensitive     CEFEPIME <=1 SENSITIVE Sensitive     CEFTAZIDIME <=1 SENSITIVE Sensitive     CEFTRIAXONE <=1 SENSITIVE Sensitive     CIPROFLOXACIN <=0.25 SENSITIVE Sensitive     GENTAMICIN <=1 SENSITIVE Sensitive     IMIPENEM <=0.25 SENSITIVE Sensitive     TRIMETH/SULFA <=20 SENSITIVE Sensitive     AMPICILLIN/SULBACTAM 8 SENSITIVE Sensitive     PIP/TAZO <=4 SENSITIVE Sensitive     * KLEBSIELLA OXYTOCA  Blood Culture ID Panel (Reflexed)     Status: Abnormal   Collection Time: 05/23/19  2:48 AM  Result Value Ref Range Status   Enterococcus species NOT DETECTED NOT DETECTED Final   Listeria monocytogenes NOT DETECTED NOT DETECTED Final   Staphylococcus species NOT DETECTED NOT DETECTED Final   Staphylococcus aureus (BCID) NOT DETECTED NOT DETECTED Final   Streptococcus species DETECTED (A) NOT DETECTED Final    Comment: Not Enterococcus species, Streptococcus agalactiae, Streptococcus pyogenes, or Streptococcus pneumoniae. CRITICAL RESULT CALLED TO, READ BACK BY AND VERIFIED WITH: Hughie Closs [facility] 05/23/19 2006 JDW    Streptococcus agalactiae NOT DETECTED NOT DETECTED Final   Streptococcus pneumoniae NOT DETECTED NOT DETECTED Final   Streptococcus pyogenes NOT DETECTED NOT DETECTED Final   Acinetobacter baumannii NOT DETECTED NOT DETECTED Final   Enterobacteriaceae species DETECTED (A) NOT DETECTED Final    Comment: Enterobacteriaceae represent a large family of gram-negative bacteria, not a single organism. CRITICAL RESULT CALLED TO, READ BACK BY AND VERIFIED WITH: Hughie Closs [facility] 05/23/19 2006 JDW    Enterobacter cloacae complex NOT DETECTED NOT DETECTED Final    Escherichia coli NOT DETECTED NOT DETECTED Final   Klebsiella oxytoca DETECTED (A) NOT DETECTED Final    Comment: CRITICAL RESULT CALLED TO, READ BACK BY AND VERIFIED WITH: Hughie Closs [facility] 05/23/19/2006 JDW    Klebsiella pneumoniae NOT DETECTED NOT  DETECTED Final   Proteus species NOT DETECTED NOT DETECTED Final   Serratia marcescens NOT DETECTED NOT DETECTED Final   Carbapenem resistance NOT DETECTED NOT DETECTED Final   Haemophilus influenzae NOT DETECTED NOT DETECTED Final   Neisseria meningitidis NOT DETECTED NOT DETECTED Final   Pseudomonas aeruginosa NOT DETECTED NOT DETECTED Final   Candida albicans NOT DETECTED NOT DETECTED Final   Candida glabrata NOT DETECTED NOT DETECTED Final   Candida krusei NOT DETECTED NOT DETECTED Final   Candida parapsilosis NOT DETECTED NOT DETECTED Final   Candida tropicalis NOT DETECTED NOT DETECTED Final    Comment: Performed at [facility], [place] [address] [place], [place] [address]  Urine culture     Status: None   Collection Time: 05/23/19  2:53 AM   Specimen: In/Out Cath Urine  Result Value Ref Range Status   Specimen Description IN/OUT CATH URINE  Final   Special Requests NONE  Final   Culture   Final    NO GROWTH Performed at [facility], [place] [address], [place], [place] [address]    Report Status 05/24/2019 FINAL  Final  Respiratory Panel by RT PCR (Flu A&B, Covid) - Nasopharyngeal Swab     Status: None   Collection Time: 05/23/19  3:19 AM   Specimen: Nasopharyngeal Swab  Result Value Ref Range Status   SARS Coronavirus 2 by RT PCR NEGATIVE NEGATIVE Final    Comment: (NOTE) SARS-CoV-2 target nucleic acids are NOT DETECTED. The SARS-CoV-2 RNA is generally detectable in upper respiratoy specimens during the acute phase of infection. The lowest concentration of SARS-CoV-2 viral copies this assay can detect is 131 copies/mL. A negative result does not preclude SARS-Cov-2 infection and should not be used as the sole basis  for treatment or other patient management decisions. A negative result may occur with  improper specimen collection/handling, submission of specimen other than nasopharyngeal swab, presence of viral mutation(s) within the areas targeted by this assay, and inadequate number of viral copies (<131 copies/mL). A negative result must be combined with clinical observations, patient history, and epidemiological information. The expected result is Negative. Fact Sheet for Patients:  PinkCheek.be Fact Sheet for Healthcare Providers:  GravelBags.it This test is not yet ap proved or cleared by the [place] FDA and  has been authorized for detection and/or diagnosis of SARS-CoV-2 by FDA under an Emergency Use Authorization (EUA). This EUA will remain  in effect (meaning this test can be used) for the duration of the COVID-19 declaration under Section 564(b)(1) of the Act, 21 U.S.C. section 360bbb-3(b)(1), unless the authorization is terminated or revoked sooner.    Influenza A by PCR NEGATIVE NEGATIVE Final   Influenza B by PCR NEGATIVE NEGATIVE Final    Comment: (NOTE) The Xpert Xpress SARS-CoV-2/FLU/RSV assay is intended as an aid in  the diagnosis of influenza from Nasopharyngeal swab specimens and  should not be used as a sole basis for treatment. Nasal washings and  aspirates are unacceptable for Xpert Xpress SARS-CoV-2/FLU/RSV  testing. Fact Sheet for Patients: PinkCheek.be Fact Sheet for Healthcare Providers: GravelBags.it This test is not yet approved or cleared by the [place] FDA and  has been authorized for detection and/or diagnosis of SARS-CoV-2 by  FDA under an Emergency Use Authorization (EUA). This EUA will remain  in effect (meaning this test can be used) for the duration of the  Covid-19 declaration under Section 564(b)(1) of the Act, 21  U.S.C.  section 360bbb-3(b)(1), unless the authorization is  terminated or revoked. Performed at [facility], [place] [address], [place], [place] [address]   Body fluid culture     Status: None   Collection Time: 05/23/19  5:12 PM   Specimen: Gallbladder; Body Fluid  Result Value Ref Range Status   Specimen Description GALL BLADDER  Final   Special Requests BILE  Final   Gram Stain   Final    ABUNDANT WBC PRESENT, PREDOMINANTLY PMN ABUNDANT GRAM POSITIVE COCCI ABUNDANT GRAM NEGATIVE RODS ABUNDANT GRAM POSITIVE RODS Performed at [facility], [place] [address] [place], [place] [address]    Culture   Final    ABUNDANT KLEBSIELLA OXYTOCA ABUNDANT STREPTOCOCCUS ANGINOSIS    Report Status 05/26/2019 FINAL  Final   Organism ID, Bacteria KLEBSIELLA OXYTOCA  Final      Susceptibility   Klebsiella oxytoca - MIC*    AMPICILLIN >=32 RESISTANT Resistant     CEFAZOLIN <=4 SENSITIVE Sensitive     CEFEPIME <=1 SENSITIVE Sensitive     CEFTAZIDIME <=1 SENSITIVE Sensitive     CEFTRIAXONE <=1 SENSITIVE Sensitive     CIPROFLOXACIN <=0.25 SENSITIVE Sensitive     GENTAMICIN <=1 SENSITIVE Sensitive     IMIPENEM <=0.25 SENSITIVE Sensitive     TRIMETH/SULFA <=20 SENSITIVE Sensitive     AMPICILLIN/SULBACTAM 4 SENSITIVE Sensitive     PIP/TAZO <=4 SENSITIVE Sensitive     * ABUNDANT KLEBSIELLA OXYTOCA  MRSA PCR Screening     Status: None   Collection Time: 05/24/19 12:12 AM   Specimen: Nasopharyngeal  Result Value Ref Range Status   MRSA by PCR NEGATIVE NEGATIVE Final    Comment:        The GeneXpert MRSA Assay (FDA approved for NASAL specimens only), is one component of a comprehensive MRSA colonization surveillance program. It is not intended to diagnose MRSA infection nor to guide or monitor treatment for MRSA infections. Performed at [facility], [place] [address] [place],  [address]     Radiology Reports IR Perc Cholecystostomy  Result Date: 05/23/2019 INDICATION:  74 year old female with acute calculus cholecystitis and acute sepsis. She is considered a high risk surgical candidate at this time and therefore presents for percutaneous cholecystostomy tube placement. EXAM: CHOLECYSTOSTOMY MEDICATIONS: Patient is currently an inpatient and receiving intravenous antibiotics. No additional antibiotic coverage was provided. ANESTHESIA/SEDATION: Moderate (conscious) sedation was employed during this procedure. A total of Versed 1 mg, 0.5 mg Dilaudid and Fentanyl 50 mcg was administered intravenously. Moderate Sedation Time: 19 minutes. The patient's level of consciousness and vital signs were monitored continuously by radiology nursing throughout the procedure under my direct supervision. FLUOROSCOPY TIME:  Fluoroscopy Time: 1 minutes 36 seconds (17 mGy). COMPLICATIONS: None immediate. PROCEDURE: Informed written consent was obtained from the patient after a thorough discussion of the procedural risks, benefits and alternatives. All questions were addressed. Maximal Sterile Barrier Technique was utilized including caps, mask, sterile gowns, sterile gloves, sterile drape, hand hygiene and skin antiseptic. A timeout was performed prior to the initiation of the procedure. The right upper quadrant was interrogated with ultrasound. The hydropic gallbladder containing multiple stones was successfully identified. A suitable skin entry site was selected and marked. The overlying skin was sterilely prepped and draped in the standard fashion using chlorhexidine skin prep. Local anesthesia was attained by infiltration with 1% lidocaine. Under real-time ultrasound guidance, a 21 gauge Accustick needle was advanced along a short transhepatic course and into the gallbladder lumen. A wire was advanced into the gallbladder lumen.  The needle was exchanged for the transitional peel-away sheath which was successfully advanced into the gallbladder lumen. A gentle hand injection of contrast material  opacifies the bladder lumen. An Amplatz wire was then coiled in the gallbladder. The transitional sheath was removed. The transhepatic tract was dilated to 10 [place] and a Cook 10.2 [place] all-purpose drainage catheter was advanced over the wire and formed in the gallbladder. There was copious return of foul-smelling purulent bile. Samples were aspirated and sent for Gram stain and culture. The catheter was connected to gravity bag drainage and then secured to the skin with 0 Prolene suture. Sterile bandages were applied. IMPRESSION: Successful placement of a transhepatic 10 French percutaneous cholecystostomy tube for the indication of acute calculus cholecystitis in a septic, high risk patient who is a poor candidate for surgery. Electronically Signed   By: Jacqulynn Cadet M.D.   On: 05/23/2019 17:37   DG Chest Port 1 View  Result Date: 05/23/2019 CLINICAL DATA:  Shortness of breath, sepsis EXAM: PORTABLE CHEST 1 VIEW COMPARISON:  04/22/2019 FINDINGS: Lingular atelectasis. Right lung clear. Heart is normal size. No effusions or acute bony abnormality. IMPRESSION: Lingular atelectasis. Electronically Signed   By: Rolm Baptise M.D.   On: 05/23/2019 03:20   CT Renal Stone Study  Result Date: 05/23/2019 CLINICAL DATA:  Malaise. Diffuse abdominal pain. EXAM: CT ABDOMEN AND PELVIS WITHOUT CONTRAST TECHNIQUE: Multidetector CT imaging of the abdomen and pelvis was performed following the standard protocol without IV contrast. COMPARISON:  Abdominal ultrasound 12/06/2017 FINDINGS: Lower chest: Asymmetric right lower lobe airspace disease is present. Minimal atelectasis is present at the left base. The heart size is normal. No significant pleural or pericardial effusion is present. Hepatobiliary: Liver is within normal limits. Numerous gallstones are present. Inflammatory changes are evident about the gallbladder with gallbladder wall thickening. Common bile duct is unremarkable. Pancreas: Unremarkable. No  pancreatic ductal dilatation or surrounding inflammatory changes. Spleen: Normal in size without focal abnormality. Adrenals/Urinary Tract: Adrenal glands are normal bilaterally. Kidneys and ureters are within normal limits. No stone or mass lesion is present. Urinary bladder is normal, collapsed. Stomach/Bowel: Inflammatory changes are noted in the gastric antrum and proximal duodenum. No discrete ulcer or abscess is present. Small bowel is within normal limits. Terminal ileum is normal. Inflammatory changes are present at the hepatic flexure. Transverse colon is normal. Descending and sigmoid colon are normal. Vascular/Lymphatic: Atherosclerotic changes are noted in the aorta branch vessels without aneurysm. Reproductive: Fibroid uterus is present. Uterus and adnexa are otherwise within limits. Other: No abdominal wall hernia or abnormality. No abdominopelvic ascites. Musculoskeletal: Degenerative changes are present the lumbar spine. Facet hypertrophy contributes to foraminal stenosis bilaterally at L5-S1. Vertebral body heights are maintained. No focal lytic or blastic lesions are present. Bony pelvis is within normal limits. The hips are located and within limits bilaterally. IMPRESSION: 1. Inflammatory changes about the gallbladder with gallbladder wall thickening and cholelithiasis compatible with acute cholecystitis. 2. Diffuse inflammatory changes are also present at the gastric antrum and duodenum. This may represent acute duodenitis versus cholecystitis. 3. Inflammatory changes at the hepatic flexure of the colon are likely secondary. 4. Asymmetric right lower lobe airspace disease concerning for pneumonia or aspiration. 5. Fibroid uterus. 6. Aortic Atherosclerosis (ICD10-I70.0). Electronically Signed   By: San Morelle M.D.   On: 05/23/2019 04:54     Phillips Climes M.D on 05/26/2019 at 2:26 PM  Between 7am to 7pm - Pager - [phone]  After 7pm go to [email or web address] - password  [facility] -  Office  [phone]

## 2019-05-27 ENCOUNTER — Telehealth: Payer: Self-pay

## 2019-05-27 LAB — BASIC METABOLIC PANEL
Anion gap: 8 (ref 5–15)
BUN: 9 mg/dL (ref 8–23)
CO2: 22 mmol/L (ref 22–32)
Calcium: 8.1 mg/dL — ABNORMAL LOW (ref 8.9–10.3)
Chloride: 109 mmol/L (ref 98–111)
Creatinine, Ser: 0.84 mg/dL (ref 0.44–1.00)
GFR calc Af Amer: >60 mL/min (ref >60)
GFR calc non Af Amer: >60 mL/min (ref >60)
Glucose, Bld: 89 mg/dL (ref 70–99)
Potassium: 3.2 mmol/L — ABNORMAL LOW (ref 3.5–5.1)
Sodium: 139 mmol/L (ref 135–145)

## 2019-05-27 LAB — CULTURE, BLOOD (ROUTINE X 2): Special Requests: ADEQUATE

## 2019-05-27 LAB — CBC
HCT: 36.2 % (ref 36.0–46.0)
Hemoglobin: 11.7 g/dL — ABNORMAL LOW (ref 12.0–15.0)
MCH: 29.1 pg (ref 26.0–34.0)
MCHC: 32.3 g/dL (ref 30.0–36.0)
MCV: 90 fL (ref 80.0–100.0)
Platelets: 196 10*3/uL (ref 150–400)
RBC: 4.02 MIL/uL (ref 3.87–5.11)
RDW: 14.5 % (ref 11.5–15.5)
WBC: 9.3 10*3/uL (ref 4.0–10.5)
nRBC: 0 % (ref 0.0–0.2)

## 2019-05-27 MED ORDER — POTASSIUM CHLORIDE CRYS ER 20 MEQ PO TBCR
40.0000 meq | EXTENDED_RELEASE_TABLET | ORAL | Status: AC
  Administered 2019-05-27 (×3): 40 meq via ORAL
  Filled 2019-05-27 (×3): qty 2

## 2019-05-27 MED ORDER — MAGNESIUM HYDROXIDE 400 MG/5ML PO SUSP
30.0000 mL | Freq: Once | ORAL | Status: AC
  Administered 2019-05-27: 11:00:00 30 mL via ORAL
  Filled 2019-05-27: qty 30

## 2019-05-27 MED ORDER — POLYETHYLENE GLYCOL 3350 17 G PO PACK
34.0000 g | PACK | Freq: Two times a day (BID) | ORAL | Status: AC
  Administered 2019-05-27: 11:00:00 34 g via ORAL
  Filled 2019-05-27 (×2): qty 2

## 2019-05-27 MED ORDER — MAGNESIUM CITRATE PO SOLN
0.5000 | Freq: Once | ORAL | Status: AC
  Administered 2019-05-27: 16:00:00 0.5 via ORAL
  Filled 2019-05-27: qty 296

## 2019-05-27 MED ORDER — HYDROCODONE-ACETAMINOPHEN 5-325 MG PO TABS: 1.0000 | ORAL_TABLET | ORAL | Status: DC | PRN

## 2019-05-27 MED ORDER — POTASSIUM CHLORIDE CRYS ER 20 MEQ PO TBCR
40.0000 meq | EXTENDED_RELEASE_TABLET | ORAL | Status: AC
  Administered 2019-05-27 (×2): 40 meq via ORAL
  Filled 2019-05-27 (×2): qty 2

## 2019-05-27 NOTE — Progress Notes (Signed)
   05/27/19 1000  Clinical Encounter Type  Visited With Patient  Visit Type Initial  Referral From Patient  Consult/Referral To None  Spiritual Encounters  Spiritual Needs Prayer;Emotional  Stress Factors  Patient Stress Factors Health changes  Family Stress Factors None identified   Chaplain visited with patient who had requested to see a chaplain for prayer. Patient shared about her stay in the hospital and some anxiety she had about going to a rehab facility once discharged. Chaplain provided empathic listening and prayer for her health and family.   Chaplain Rolin Barry On Call Pager: [phone]

## 2019-05-27 NOTE — Progress Notes (Signed)
PROGRESS NOTE                                                                                                                                                                                                             Patient Demographics:    Gabriella Butler, is a 74 y.o. female, DOB - [birth year], [id number]  Admit date - 05/23/2019   Admitting Physician Norval Morton, MD  Outpatient Primary MD for the patient is Patient, No Pcp Per  LOS - 4   Chief Complaint  Patient presents with  . Tachycardia  . Hypotension  . Lethargic       Brief Narrative    74 y.o. female with medical history significant of hypertension, asthma with bronchitis, gout, cholelithiasis, and gallstone pancreatitis presents with complaints of abdominal pain , her work-up significant for acute cholecystitis, patient with hospitalization couple years ago significant for cholelithiasis, and acute pancreatitis, but she did not follow-up as an outpatient, she did not want to proceed with surgery when.  He presents this admission with sepsis secondary to acute cholecystitis/bacteremia , status post IR percutaneous cholecystostomy drain, surgery is following.   Subjective:    Gabriella Butler today still reports some constipation despite receiving a laxative yesterday, otherwise denies any complaints.  .   Assessment  & Plan :    Principal Problem:   Sepsis ([place]) Active Problems:   Cholelithiasis with acute cholecystitis   Asthma   Hypomagnesemia   Hypokalemia   Obesity (BMI 30-39.9)   History of prediabetes   Right lower lobe pneumonia   Sepsis secondary to cholelithiasis with acute cholecystitis/bacteremia: -Is present on admission with fever up to 103.3 F, tachycardic, tachypneic, and hypotensive.  Labs significant for WBC 10.2 with signs of left shift and lactic acid elevated up to 3.8.   - CT revealed gallbladder wall thickening and cholelithiasis concerning for acute  cholecystitis.  General surgery have been formally consulted, recommendation for percutaneous cholecystectomy drain.  Placed by IR 4/22. -continue  with pain control. -Regular diet. -Blood culture growing Klebsiella oxytoca, and Streptococcus Anginosis, her percutaneous cholecystostomy drain growing same organisms as well.. -She is currently on discussed with ID, recommendation to continue with IV Rocephin till 5/4, patient already has midline. -Appreciate general surgery and IR consultative services  Right lower lobe pneumonia:  - Acute.  Patient reports  having intermittent productive cough.   -Antibiotics as seen above -Was encouraged use incentive spirometry  Acute kidney injury:  Patient presents with creatinine elevated up to 1.77 with BUN 24.  Baseline previously noted to be 0.98 just 64-month ago. -Improving with IV fluids  Hyperglycemia, history of prediabetes: -  Acute.  Glucose on admission elevated to 186.  Last hemoglobin A1c noted to be 6.2 in 12/2017. - A1c is 6.6 -Consider placing on sliding scale insulin if needed  Hypokalemia:  -Remains low at 3.2 today, will replete again  Hypomagnesemia:  - repleted  Elevated alkaline phosphatase:  - Trending down.  History of asthma:  - Patient currently not wheezing.  Appears to have not been taking Qvar or albuterol at home. -Albuterol as needed for shortness of breath/wheezing  Obesity: BMI 33.38 kg/m    COVID-19 Labs  No results for input(s): DDIMER, FERRITIN, LDH, CRP in the last 72 hours.  Lab Results  Component Value Date   SARSCOV2NAA NEGATIVE 05/23/2019   [place] NEGATIVE 04/23/2019     Code Status : Full   Family Communication  : None at bedside today, tried to call daughter via phone, unable to leave voicemail.  Disposition Plan  :  Status is: Inpatient  Remains inpatient appropriate because:IV treatments appropriate due to intensity of illness or inability to take PO   Dispo: The  patient is from: Home              Anticipated d/c is to: SNF              Anticipated d/c date is: 1 day              Patient currently is not medically stable to d/c.   Consults  :  General surgery  Procedures  : Percutaneous cholecystostomy drain by IR 4/22  DVT Prophylaxis  :   [place] heparin   Lab Results  Component Value Date   PLT 196 05/27/2019    Antibiotics  :    Anti-infectives (From admission, onward)   Start     Dose/Rate Route Frequency Ordered Stop   05/23/19 2200  vancomycin (VANCOREADY) IVPB 750 mg/150 mL  Status:  Discontinued     750 mg 150 mL/hr over 60 Minutes Intravenous Every 24 hours 05/23/19 0518 05/23/19 2136   05/23/19 2200  cefTRIAXone (ROCEPHIN) 2 g in sodium chloride 0.9 % 100 mL IVPB     2 g 200 mL/hr over 30 Minutes Intravenous Every 24 hours 05/23/19 2136     05/23/19 1200  metroNIDAZOLE (FLAGYL) IVPB 500 mg  Status:  Discontinued     500 mg 100 mL/hr over 60 Minutes Intravenous Every 8 hours 05/23/19 0617 05/27/19 1059   05/23/19 1000  ceFEPIme (MAXIPIME) 2 g in sodium chloride 0.9 % 100 mL IVPB  Status:  Discontinued     2 g 200 mL/hr over 30 Minutes Intravenous Every 12 hours 05/23/19 0518 05/23/19 2136   05/23/19 0300  ceFEPIme (MAXIPIME) 2 g in sodium chloride 0.9 % 100 mL IVPB     2 g 200 mL/hr over 30 Minutes Intravenous  Once 05/23/19 0253 05/23/19 0340   05/23/19 0300  metroNIDAZOLE (FLAGYL) IVPB 500 mg     500 mg 100 mL/hr over 60 Minutes Intravenous  Once 05/23/19 0253 05/23/19 0408   05/23/19 0300  vancomycin (VANCOCIN) IVPB 1000 mg/200 mL premix     1,000 mg 200 mL/hr over 60 Minutes Intravenous  Once 05/23/19 0253 05/23/19 0451  Objective:   Vitals:   05/27/19 0000 05/27/19 0400 05/27/19 0753 05/27/19 1254  BP: (!) 168/78 (!) 164/83 (!) 155/75 (!) 157/77  Pulse: 76 79 73 71  Resp: 20 17 (!) 22 19  Temp: 98.2 F (36.8 C) 98.2 F (36.8 C) 98.6 F (37 C) 98.3 F (36.8 C)  TempSrc: Oral  Oral Oral  SpO2: 97% 95%  97% 96%  Weight:      Height:        Wt Readings from Last 3 Encounters:  05/23/19 91 kg  04/22/19 90.7 kg  12/06/17 90.7 kg     Intake/Output Summary (Last 24 hours) at 05/27/2019 1509 Last data filed at 05/27/2019 1202 Gross per 24 hour  Intake 2380 ml  Output 1400 ml  Net 980 ml     Physical Exam  Awake Alert, Oriented X 3, No new F.N deficits, Normal affect Symmetrical Chest wall movement, Good air movement bilaterally, CTAB RRR,No Gallops,Rubs or new Murmurs, No Parasternal Heave RUQ drain, +ve B.Sounds, Abd Soft, No tenderness, No rebound - guarding or rigidity. No Cyanosis, Clubbing or edema, No new Rash or bruise        Data Review:    CBC Recent Labs  Lab 05/23/19 0253 05/23/19 0433 05/24/19 0759 05/25/19 0354 05/27/19 0402  WBC 10.2  --  14.5* 11.7* 9.3  HGB 14.9 13.6 13.4 11.6* 11.7*  HCT 45.7 40.0 41.9 35.6* 36.2  PLT 208  --  144* 172 196  MCV 89.3  --  91.5 90.8 90.0  MCH 29.1  --  29.3 29.6 29.1  MCHC 32.6  --  32.0 32.6 32.3  RDW 13.6  --  14.5 14.6 14.5  LYMPHSABS 0.8  --   --   --   --   MONOABS 0.1  --   --   --   --   EOSABS 0.0  --   --   --   --   BASOSABS 0.1  --   --   --   --     Chemistries  Recent Labs  Lab 05/23/19 0253 05/23/19 0433 05/23/19 0530 05/24/19 0759 05/25/19 0354 05/27/19 0402  NA 135 138  --  139 141 139  K 3.3* 2.8*  --  4.2 3.2* 3.2*  CL 104  --   --  112* 114* 109  CO2 17*  --   --  16* 20* 22  GLUCOSE 186*  --   --  114* 100* 89  BUN 24*  --   --  17 14 9   CREATININE 1.77*  --   --  1.12* 0.87 0.84  CALCIUM 9.4  --   --  8.3* 8.3* 8.1*  MG  --   --  1.6* 1.7  --   --   AST 25  --   --  31 16  --   ALT 18  --   --  26 16  --   ALKPHOS 180*  --   --  64 61  --   BILITOT 1.1  --   --  1.5* 0.6  --    ------------------------------------------------------------------------------------------------------------------ No results for input(s): CHOL, HDL, LDLCALC, TRIG, CHOLHDL, LDLDIRECT in the last 72  hours.  Lab Results  Component Value Date   HGBA1C 6.6 (H) 05/24/2019   ------------------------------------------------------------------------------------------------------------------ No results for input(s): TSH, T4TOTAL, T3FREE, THYROIDAB in the last 72 hours.  Invalid input(s): FREET3 ------------------------------------------------------------------------------------------------------------------ No results for input(s): VITAMINB12, FOLATE, FERRITIN, TIBC, IRON, RETICCTPCT in the last  72 hours.  Coagulation profile Recent Labs  Lab 05/23/19 0253  INR 1.2    No results for input(s): DDIMER in the last 72 hours.  Cardiac Enzymes No results for input(s): CKMB, TROPONINI, MYOGLOBIN in the last 168 hours.  Invalid input(s): CK ------------------------------------------------------------------------------------------------------------------    Component Value Date/Time   BNP 59.2 04/22/2019 2157    Inpatient Medications  Scheduled Meds: . heparin  5,000 Units Subcutaneous Q8H  . polyethylene glycol  34 g Oral BID  . senna-docusate  2 tablet Oral BID  . sodium chloride flush  10-40 mL Intracatheter Q12H  . sodium chloride flush  3 mL Intravenous Q12H  . sodium chloride flush  5 mL Intracatheter Q8H   Continuous Infusions: . sodium chloride 75 mL/hr at 05/27/19 0418  . sodium chloride 250 mL (05/27/19 0417)  . cefTRIAXone (ROCEPHIN)  IV Stopped (05/26/19 2203)   PRN Meds:.sodium chloride, acetaminophen **OR** acetaminophen, albuterol, morphine injection, ondansetron **OR** ondansetron (ZOFRAN) IV, sodium chloride flush  Micro Results Recent Results (from the past 240 hour(s))  Blood Culture (routine x 2)     Status: Abnormal   Collection Time: 05/23/19  2:48 AM   Specimen: BLOOD  Result Value Ref Range Status   Specimen Description BLOOD RIGHT ANTECUBITAL  Final   Special Requests   Final    BOTTLES DRAWN AEROBIC AND ANAEROBIC Blood Culture adequate volume    Culture  Setup Time   Final    IN BOTH AEROBIC AND ANAEROBIC BOTTLES GRAM POSITIVE COCCI IN CHAINS GRAM NEGATIVE RODS CRITICAL RESULT CALLED TO, READ BACK BY AND VERIFIED WITH: Hughie Closs [facility] 05/23/19 2006 JDW Performed at [facility], [place] [address] [place], [place] [address]    Culture KLEBSIELLA OXYTOCA STREPTOCOCCUS ANGINOSIS  (A)  Final   Report Status 05/27/2019 FINAL  Final   Organism ID, Bacteria KLEBSIELLA OXYTOCA  Final   Organism ID, Bacteria STREPTOCOCCUS ANGINOSIS  Final      Susceptibility   Klebsiella oxytoca - MIC*    AMPICILLIN >=32 RESISTANT Resistant     CEFAZOLIN <=4 SENSITIVE Sensitive     CEFEPIME <=1 SENSITIVE Sensitive     CEFTAZIDIME <=1 SENSITIVE Sensitive     CEFTRIAXONE <=1 SENSITIVE Sensitive     CIPROFLOXACIN <=0.25 SENSITIVE Sensitive     GENTAMICIN <=1 SENSITIVE Sensitive     IMIPENEM <=0.25 SENSITIVE Sensitive     TRIMETH/SULFA <=20 SENSITIVE Sensitive     AMPICILLIN/SULBACTAM 8 SENSITIVE Sensitive     PIP/TAZO <=4 SENSITIVE Sensitive     * KLEBSIELLA OXYTOCA   Streptococcus anginosis - MIC*    PENICILLIN <=0.06 SENSITIVE Sensitive     CEFTRIAXONE 0.5 SENSITIVE Sensitive     ERYTHROMYCIN <=0.12 SENSITIVE Sensitive     LEVOFLOXACIN 0.5 SENSITIVE Sensitive     VANCOMYCIN 0.5 SENSITIVE Sensitive     * STREPTOCOCCUS ANGINOSIS  Blood Culture ID Panel (Reflexed)     Status: Abnormal   Collection Time: 05/23/19  2:48 AM  Result Value Ref Range Status   Enterococcus species NOT DETECTED NOT DETECTED Final   Listeria monocytogenes NOT DETECTED NOT DETECTED Final   Staphylococcus species NOT DETECTED NOT DETECTED Final   Staphylococcus aureus (BCID) NOT DETECTED NOT DETECTED Final   Streptococcus species DETECTED (A) NOT DETECTED Final    Comment: Not Enterococcus species, Streptococcus agalactiae, Streptococcus pyogenes, or Streptococcus pneumoniae. CRITICAL RESULT CALLED TO, READ BACK BY AND VERIFIED WITH: Hughie Closs [facility] 05/23/19 2006 JDW      Streptococcus agalactiae NOT DETECTED  NOT DETECTED Final   Streptococcus pneumoniae NOT DETECTED NOT DETECTED Final   Streptococcus pyogenes NOT DETECTED NOT DETECTED Final   Acinetobacter baumannii NOT DETECTED NOT DETECTED Final   Enterobacteriaceae species DETECTED (A) NOT DETECTED Final    Comment: Enterobacteriaceae represent a large family of gram-negative bacteria, not a single organism. CRITICAL RESULT CALLED TO, READ BACK BY AND VERIFIED WITH: Hughie Closs [facility] 05/23/19 2006 JDW    Enterobacter cloacae complex NOT DETECTED NOT DETECTED Final   Escherichia coli NOT DETECTED NOT DETECTED Final   Klebsiella oxytoca DETECTED (A) NOT DETECTED Final    Comment: CRITICAL RESULT CALLED TO, READ BACK BY AND VERIFIED WITH: Hughie Closs [facility] 05/23/19/2006 JDW    Klebsiella pneumoniae NOT DETECTED NOT DETECTED Final   Proteus species NOT DETECTED NOT DETECTED Final   Serratia marcescens NOT DETECTED NOT DETECTED Final   Carbapenem resistance NOT DETECTED NOT DETECTED Final   Haemophilus influenzae NOT DETECTED NOT DETECTED Final   Neisseria meningitidis NOT DETECTED NOT DETECTED Final   Pseudomonas aeruginosa NOT DETECTED NOT DETECTED Final   Candida albicans NOT DETECTED NOT DETECTED Final   Candida glabrata NOT DETECTED NOT DETECTED Final   Candida krusei NOT DETECTED NOT DETECTED Final   Candida parapsilosis NOT DETECTED NOT DETECTED Final   Candida tropicalis NOT DETECTED NOT DETECTED Final    Comment: Performed at [facility], [place]. [address] [place], [place] [address]  Urine culture     Status: None   Collection Time: 05/23/19  2:53 AM   Specimen: In/Out Cath Urine  Result Value Ref Range Status   Specimen Description IN/OUT CATH URINE  Final   Special Requests NONE  Final   Culture   Final    NO GROWTH Performed at [facility], [place] [address], [place], [place] [address]    Report Status 05/24/2019 FINAL  Final  Respiratory Panel by RT PCR (Flu A&B, Covid) -  Nasopharyngeal Swab     Status: None   Collection Time: 05/23/19  3:19 AM   Specimen: Nasopharyngeal Swab  Result Value Ref Range Status   SARS Coronavirus 2 by RT PCR NEGATIVE NEGATIVE Final    Comment: (NOTE) SARS-CoV-2 target nucleic acids are NOT DETECTED. The SARS-CoV-2 RNA is generally detectable in upper respiratoy specimens during the acute phase of infection. The lowest concentration of SARS-CoV-2 viral copies this assay can detect is 131 copies/mL. A negative result does not preclude SARS-Cov-2 infection and should not be used as the sole basis for treatment or other patient management decisions. A negative result may occur with  improper specimen collection/handling, submission of specimen other than nasopharyngeal swab, presence of viral mutation(s) within the areas targeted by this assay, and inadequate number of viral copies (<131 copies/mL). A negative result must be combined with clinical observations, patient history, and epidemiological information. The expected result is Negative. Fact Sheet for Patients:  PinkCheek.be Fact Sheet for Healthcare Providers:  GravelBags.it This test is not yet ap proved or cleared by the [place] FDA and  has been authorized for detection and/or diagnosis of SARS-CoV-2 by FDA under an Emergency Use Authorization (EUA). This EUA will remain  in effect (meaning this test can be used) for the duration of the COVID-19 declaration under Section 564(b)(1) of the Act, 21 U.S.C. section 360bbb-3(b)(1), unless the authorization is terminated or revoked sooner.    Influenza A by PCR NEGATIVE NEGATIVE Final   Influenza B by PCR NEGATIVE NEGATIVE Final    Comment: (  NOTE) The Xpert Xpress SARS-CoV-2/FLU/RSV assay is intended as an aid in  the diagnosis of influenza from Nasopharyngeal swab specimens and  should not be used as a sole basis for treatment. Nasal washings and  aspirates  are unacceptable for Xpert Xpress SARS-CoV-2/FLU/RSV  testing. Fact Sheet for Patients: PinkCheek.be Fact Sheet for Healthcare Providers: GravelBags.it This test is not yet approved or cleared by the [place] FDA and  has been authorized for detection and/or diagnosis of SARS-CoV-2 by  FDA under an Emergency Use Authorization (EUA). This EUA will remain  in effect (meaning this test can be used) for the duration of the  Covid-19 declaration under Section 564(b)(1) of the Act, 21  U.S.C. section 360bbb-3(b)(1), unless the authorization is  terminated or revoked. Performed at [facility], [place] [address] [place], [place] [address]   Body fluid culture     Status: None   Collection Time: 05/23/19  5:12 PM   Specimen: Gallbladder; Body Fluid  Result Value Ref Range Status   Specimen Description GALL BLADDER  Final   Special Requests BILE  Final   Gram Stain   Final    ABUNDANT WBC PRESENT, PREDOMINANTLY PMN ABUNDANT GRAM POSITIVE COCCI ABUNDANT GRAM NEGATIVE RODS ABUNDANT GRAM POSITIVE RODS Performed at [facility], [place] [address], [place], [place] [address]    Culture   Final    ABUNDANT KLEBSIELLA OXYTOCA ABUNDANT STREPTOCOCCUS ANGINOSIS    Report Status 05/26/2019 FINAL  Final   Organism ID, Bacteria KLEBSIELLA OXYTOCA  Final      Susceptibility   Klebsiella oxytoca - MIC*    AMPICILLIN >=32 RESISTANT Resistant     CEFAZOLIN <=4 SENSITIVE Sensitive     CEFEPIME <=1 SENSITIVE Sensitive     CEFTAZIDIME <=1 SENSITIVE Sensitive     CEFTRIAXONE <=1 SENSITIVE Sensitive     CIPROFLOXACIN <=0.25 SENSITIVE Sensitive     GENTAMICIN <=1 SENSITIVE Sensitive     IMIPENEM <=0.25 SENSITIVE Sensitive     TRIMETH/SULFA <=20 SENSITIVE Sensitive     AMPICILLIN/SULBACTAM 4 SENSITIVE Sensitive     PIP/TAZO <=4 SENSITIVE Sensitive     * ABUNDANT KLEBSIELLA OXYTOCA  MRSA PCR Screening     Status: None   Collection  Time: 05/24/19 12:12 AM   Specimen: Nasopharyngeal  Result Value Ref Range Status   MRSA by PCR NEGATIVE NEGATIVE Final    Comment:        The GeneXpert MRSA Assay (FDA approved for NASAL specimens only), is one component of a comprehensive MRSA colonization surveillance program. It is not intended to diagnose MRSA infection nor to guide or monitor treatment for MRSA infections. Performed at [facility], [place] [address], [place], [place] [address]     Radiology Reports IR Perc Cholecystostomy  Result Date: 05/23/2019 INDICATION: 74 year old female with acute calculus cholecystitis and acute sepsis. She is considered a high risk surgical candidate at this time and therefore presents for percutaneous cholecystostomy tube placement. EXAM: CHOLECYSTOSTOMY MEDICATIONS: Patient is currently an inpatient and receiving intravenous antibiotics. No additional antibiotic coverage was provided. ANESTHESIA/SEDATION: Moderate (conscious) sedation was employed during this procedure. A total of Versed 1 mg, 0.5 mg Dilaudid and Fentanyl 50 mcg was administered intravenously. Moderate Sedation Time: 19 minutes. The patient's level of consciousness and vital signs were monitored continuously by radiology nursing throughout the procedure under my direct supervision. FLUOROSCOPY TIME:  Fluoroscopy Time: 1 minutes 36 seconds (17 mGy). COMPLICATIONS: None immediate. PROCEDURE: Informed written consent was obtained from  the patient after a thorough discussion of the procedural risks, benefits and alternatives. All questions were addressed. Maximal Sterile Barrier Technique was utilized including caps, mask, sterile gowns, sterile gloves, sterile drape, hand hygiene and skin antiseptic. A timeout was performed prior to the initiation of the procedure. The right upper quadrant was interrogated with ultrasound. The hydropic gallbladder containing multiple stones was successfully identified. A suitable skin entry  site was selected and marked. The overlying skin was sterilely prepped and draped in the standard fashion using chlorhexidine skin prep. Local anesthesia was attained by infiltration with 1% lidocaine. Under real-time ultrasound guidance, a 21 gauge Accustick needle was advanced along a short transhepatic course and into the gallbladder lumen. A wire was advanced into the gallbladder lumen. The needle was exchanged for the transitional peel-away sheath which was successfully advanced into the gallbladder lumen. A gentle hand injection of contrast material opacifies the bladder lumen. An Amplatz wire was then coiled in the gallbladder. The transitional sheath was removed. The transhepatic tract was dilated to 10 [place] and a Cook 10.2 [place] all-purpose drainage catheter was advanced over the wire and formed in the gallbladder. There was copious return of foul-smelling purulent bile. Samples were aspirated and sent for Gram stain and culture. The catheter was connected to gravity bag drainage and then secured to the skin with 0 Prolene suture. Sterile bandages were applied. IMPRESSION: Successful placement of a transhepatic 10 French percutaneous cholecystostomy tube for the indication of acute calculus cholecystitis in a septic, high risk patient who is a poor candidate for surgery. Electronically Signed   By: Jacqulynn Cadet M.D.   On: 05/23/2019 17:37   DG Chest Port 1 View  Result Date: 05/23/2019 CLINICAL DATA:  Shortness of breath, sepsis EXAM: PORTABLE CHEST 1 VIEW COMPARISON:  04/22/2019 FINDINGS: Lingular atelectasis. Right lung clear. Heart is normal size. No effusions or acute bony abnormality. IMPRESSION: Lingular atelectasis. Electronically Signed   By: Rolm Baptise M.D.   On: 05/23/2019 03:20   CT Renal Stone Study  Result Date: 05/23/2019 CLINICAL DATA:  Malaise. Diffuse abdominal pain. EXAM: CT ABDOMEN AND PELVIS WITHOUT CONTRAST TECHNIQUE: Multidetector CT imaging of the abdomen and pelvis  was performed following the standard protocol without IV contrast. COMPARISON:  Abdominal ultrasound 12/06/2017 FINDINGS: Lower chest: Asymmetric right lower lobe airspace disease is present. Minimal atelectasis is present at the left base. The heart size is normal. No significant pleural or pericardial effusion is present. Hepatobiliary: Liver is within normal limits. Numerous gallstones are present. Inflammatory changes are evident about the gallbladder with gallbladder wall thickening. Common bile duct is unremarkable. Pancreas: Unremarkable. No pancreatic ductal dilatation or surrounding inflammatory changes. Spleen: Normal in size without focal abnormality. Adrenals/Urinary Tract: Adrenal glands are normal bilaterally. Kidneys and ureters are within normal limits. No stone or mass lesion is present. Urinary bladder is normal, collapsed. Stomach/Bowel: Inflammatory changes are noted in the gastric antrum and proximal duodenum. No discrete ulcer or abscess is present. Small bowel is within normal limits. Terminal ileum is normal. Inflammatory changes are present at the hepatic flexure. Transverse colon is normal. Descending and sigmoid colon are normal. Vascular/Lymphatic: Atherosclerotic changes are noted in the aorta branch vessels without aneurysm. Reproductive: Fibroid uterus is present. Uterus and adnexa are otherwise within limits. Other: No abdominal wall hernia or abnormality. No abdominopelvic ascites. Musculoskeletal: Degenerative changes are present the lumbar spine. Facet hypertrophy contributes to foraminal stenosis bilaterally at L5-S1. Vertebral body heights are maintained. No focal lytic or blastic lesions are  present. Bony pelvis is within normal limits. The hips are located and within limits bilaterally. IMPRESSION: 1. Inflammatory changes about the gallbladder with gallbladder wall thickening and cholelithiasis compatible with acute cholecystitis. 2. Diffuse inflammatory changes are also  present at the gastric antrum and duodenum. This may represent acute duodenitis versus cholecystitis. 3. Inflammatory changes at the hepatic flexure of the colon are likely secondary. 4. Asymmetric right lower lobe airspace disease concerning for pneumonia or aspiration. 5. Fibroid uterus. 6. Aortic Atherosclerosis (ICD10-I70.0). Electronically Signed   By: San Morelle M.D.   On: 05/23/2019 04:54   [place] EKG SITE RITE  Result Date: 05/26/2019 If Site Rite image not attached, placement could not be confirmed due to current cardiac rhythm.    Phillips Climes M.D on 05/27/2019 at 3:09 PM  Between 7am to 7pm - Pager - [phone]  After 7pm go to [email or web address] - password [facility]  [facility] -  Office  [phone]

## 2019-05-27 NOTE — Telephone Encounter (Signed)
Left patient a voice mail to call back to set up a hospital follow up with Dr. Baxter Flattery with in 3 to 4 weeks. Dr. Baxter Flattery has a 10:15 or a 3:15 for May 12th available

## 2019-05-27 NOTE — Progress Notes (Addendum)
Physical Therapy Treatment Patient Details Name: Gabriella Butler MRN: [id number] DOB: Oct 28, 1945 Today's Date: 05/27/2019    History of Present Illness 74 year old female with PMH including hypertension, asthma with bronchitis, gout, cholelithiasis, and gallstone pancreatitis.  Pt presented with abdominal pain and found to have acute cholecystitis.  She is s/p IR percutaneous cholecystostomy drain on 05/23/19.    PT Comments    Patient continues to require physical assist for transfers and was unable to weight shift to lift LEs for marching in place with RW trial 1. Trial 2, was able to advance LEs enough in order to transfer to the chair with minA and RW but is unable to ambulate at this time. Patient expresses that she wishes to discharge home and that her daughter and granddtr can take turns to assist her as needed but both also work. Question if they would be able to provide 24/7 assist for patient. Patient is below her PLOF of modI with cane. Patient would benefit from continued skilled PT services and SNF for short term rehabilitation as she currently cannot ambulate household distances.    Follow Up Recommendations  SNF     Equipment Recommendations  3in1 (PT);Rolling walker with 5" wheels; wheelchair; wheelchair cushion       Precautions / Restrictions Precautions Precautions: Fall Precaution Comments: cholecystostomy drain (R abd) Restrictions Weight Bearing Restrictions: No    Mobility  Bed Mobility Overal bed mobility: Needs Assistance Bed Mobility: Supine to Sit     Supine to sit: Supervision     General bed mobility comments: HBO approx 10 degrees  Transfers Overall transfer level: Needs assistance Equipment used: Rolling walker (2 wheeled) Transfers: Sit to/from [facility] Sit to Stand: Min assist Stand pivot transfers: Min assist       General transfer comment: Patient attempted to stand without assist but was unable so minA provided by PT  during transfers. Cues for hand placement. MinA trial 1 and 2 from EOB. Stand-step transfer trial 2 to recliner chair.  Ambulation/Gait    General Gait Details: Unable at this time. Patient with difficulty weight shifting and marching in place with RW during standing trial 1. Improved ability to weight shift and lift LEs during transfer to chair but unable to ambulate at this time.      Balance Overall balance assessment: Needs assistance Sitting-balance support: Feet supported Sitting balance-Leahy Scale: Good Sitting balance - Comments: Patient able to don her socks sitting EOB.   Standing balance support: Bilateral upper extremity supported Standing balance-Leahy Scale: Poor Standing balance comment: Difficulty weight shifting     Cognition Arousal/Alertness: Awake/alert Behavior During Therapy: WFL for tasks assessed/performed Overall Cognitive Status: Within Functional Limits for tasks assessed    Exercises General Exercises - Lower Extremity Ankle Circles/Pumps: Other (comment)(education on ankle pumps while sitting in recliner chair) Straight Leg Raises: Other (comment)(education on modified SLR while sitting in chair)    General Comments General comments (skin integrity, edema, etc.): At rest: HR 83 bpm, O2 sat 98% on room air, RR 23.       Pertinent Vitals/Pain Pain Assessment: No/denies pain Pain Intervention(s): Monitored during session       Prior Function modI with SPC   PT Goals (current goals can now be found in the care plan section) Acute Rehab PT Goals Patient Stated Goal: to go home, reports her dtr and granddtr both work but will take turns to be with her and can assist if needed Progress towards PT goals: Progressing toward  goals    Frequency    Min 3X/week      PT Plan Current plan remains appropriate       AM-PAC PT "6 Clicks" Mobility   Outcome Measure  Help needed turning from your back to your side while in a flat bed without using  bedrails?: A Little Help needed moving from lying on your back to sitting on the side of a flat bed without using bedrails?: A Little Help needed moving to and from a bed to a chair (including a wheelchair)?: A Little Help needed standing up from a chair using your arms (e.g., wheelchair or bedside chair)?: A Little Help needed to walk in hospital room?: A Lot Help needed climbing 3-5 steps with a railing? : Total 6 Click Score: 15    End of Session Equipment Utilized During Treatment: Gait belt((up high so not on drain)) Activity Tolerance: Patient limited by fatigue Patient left: in chair;with call bell/phone within reach;Other (comment)(patient verbalized understanding to ring for assist) Nurse Communication: Mobility status PT Visit Diagnosis: Unsteadiness on feet (R26.81);Muscle weakness (generalized) (M62.81)     Time: [id number] PT Time Calculation (min) (ACUTE ONLY): 36 min  Charges:  $Therapeutic Activity: 23-37 mins                     Birdie Hopes, PT, DPT Acute Rehab [phone] office     Birdie Hopes 05/27/2019, 12:36 PM

## 2019-05-27 NOTE — NC FL2 (Signed)
[place] LEVEL OF CARE SCREENING TOOL     IDENTIFICATION  Patient Name: Gabriella Butler Birthdate: 19-Apr-1945 Sex: female Admission Date (Current Location): 05/23/2019  [facility] and [place] Number:  Herbalist and Address:  The . [facility], [place] [address], [place], [place] [address]      Provider Number: [id number]  Attending Physician Name and Address:  Albertine Patricia, MD  Relative Name and Phone Number:  Sharlet Salina, daughter, [phone]    Current Level of Care: Hospital Recommended Level of Care: [place] Prior Approval Number:    Date Approved/Denied:   PASRR Number: [id number]  Discharge Plan: SNF    Current Diagnoses: Patient Active Problem List   Diagnosis Date Noted  . Sepsis ([place]) 05/23/2019  . Hypomagnesemia 05/23/2019  . Hypokalemia 05/23/2019  . Obesity (BMI 30-39.9) 05/23/2019  . History of prediabetes 05/23/2019  . Right lower lobe pneumonia 05/23/2019  . Elevated glucose level   . Acute gallstone pancreatitis 12/06/2017  . Cholelithiasis with acute cholecystitis 12/06/2017  . Essential hypertension 12/06/2017  . Asthma 12/06/2017  . Abnormal magnetic resonance cholangiopancreatography (MRCP)   . Choledocholithiasis     Orientation RESPIRATION BLADDER Height & Weight     Self, Time, Situation, Place  Normal Incontinent, External catheter(Biliary tube;) Weight: 200 lb 9.9 oz (91 kg) Height:  5\' 5"  (165.1 cm)  BEHAVIORAL SYMPTOMS/MOOD NEUROLOGICAL BOWEL NUTRITION STATUS      Continent Diet(Please see DC Summary)  AMBULATORY STATUS COMMUNICATION OF NEEDS Skin   Limited Assist Verbally Surgical wounds(Closed incision on abdomen)                       Personal Care Assistance Level of Assistance  Bathing, Feeding, Dressing Bathing Assistance: Maximum assistance Feeding assistance: Independent Dressing Assistance: Limited assistance     Functional Limitations Info  Hearing,  Sight, Speech Sight Info: Adequate Hearing Info: Adequate Speech Info: Adequate    SPECIAL CARE FACTORS FREQUENCY  PT (By licensed PT), OT (By licensed OT)     PT Frequency: 5x/week OT Frequency: 5x/week            Contractures Contractures Info: Not present    Additional Factors Info  Code Status, Allergies Code Status Info: Full Allergies Info: NKA           Current Medications (05/27/2019):  This is the current hospital active medication list Current Facility-Administered Medications  Medication Dose Route Frequency Provider Last Rate Last Admin  . 0.9 %  sodium chloride infusion   Intravenous Continuous Fuller Plan A, MD 75 mL/hr at 05/27/19 0418 Bolus from Bag at 05/27/19 0418  . 0.9 %  sodium chloride infusion   Intravenous PRN Elgergawy, Silver Huguenin, MD 10 mL/hr at 05/27/19 0417 250 mL at 05/27/19 0417  . acetaminophen (TYLENOL) tablet 650 mg  650 mg Oral Q6H PRN Norval Morton, MD       Or  . acetaminophen (TYLENOL) suppository 650 mg  650 mg Rectal Q6H PRN Smith, Rondell A, MD      . albuterol (PROVENTIL) (2.5 MG/3ML) 0.083% nebulizer solution 2.5 mg  2.5 mg Nebulization Q6H PRN Smith, Rondell A, MD      . cefTRIAXone (ROCEPHIN) 2 g in sodium chloride 0.9 % 100 mL IVPB  2 g Intravenous Q24H Norval Morton, MD   Stopped at 05/26/19 2203  . heparin injection 5,000 Units  5,000 Units Subcutaneous Q8H Allred, Darrell K, PA-C   5,000 Units  at 05/27/19 [id number]  . magnesium hydroxide (MILK OF MAGNESIA) suspension 30 mL  30 mL Oral Once Elgergawy, Silver Huguenin, MD      . metroNIDAZOLE (FLAGYL) IVPB 500 mg  500 mg Intravenous Q8H Smith, Rondell A, MD 100 mL/hr at 05/27/19 0614 500 mg at 05/27/19 0614  . morphine 2 MG/ML injection 1 mg  1 mg Intravenous Q2H PRN Smith, Rondell A, MD      . ondansetron (ZOFRAN) tablet 4 mg  4 mg Oral Q6H PRN Fuller Plan A, MD       Or  . ondansetron (ZOFRAN) injection 4 mg  4 mg Intravenous Q6H PRN Smith, Rondell A, MD      . polyethylene  glycol (MIRALAX / GLYCOLAX) packet 34 g  34 g Oral BID Elgergawy, Silver Huguenin, MD      . potassium chloride SA (KLOR-CON) CR tablet 40 mEq  40 mEq Oral Q4H Elgergawy, Silver Huguenin, MD   40 mEq at 05/27/19 0827  . senna-docusate (Senokot-S) tablet 2 tablet  2 tablet Oral BID Elgergawy, Silver Huguenin, MD   2 tablet at 05/27/19 [phone]  . sodium chloride flush (NS) 0.9 % injection 10-40 mL  10-40 mL Intracatheter Q12H Elgergawy, Silver Huguenin, MD   10 mL at 05/26/19 0952  . sodium chloride flush (NS) 0.9 % injection 10-40 mL  10-40 mL Intracatheter PRN Elgergawy, Silver Huguenin, MD      . sodium chloride flush (NS) 0.9 % injection 3 mL  3 mL Intravenous Q12H Smith, Rondell A, MD   3 mL at 05/25/19 1026  . sodium chloride flush (NS) 0.9 % injection 5 mL  5 mL Intracatheter Q8H Jacqulynn Cadet, MD   5 mL at 05/27/19 [id number]     Discharge Medications: Please see discharge summary for a list of discharge medications.  Relevant Imaging Results:  Relevant Lab Results:   Additional Information SSN: [id number]       COVID negative on 4/22. Has not had COVID vaccines.  Benard Halsted, LCSW

## 2019-05-27 NOTE — TOC Initial Note (Addendum)
Transition of Care [facility]) - Initial/Assessment Note    Patient Details  Name: Gabriella Butler MRN: [id number] Date of Birth: [birth year]  Transition [facility]) CM/SW Contact:    Benard Halsted, LCSW Phone Number: 05/27/2019, 10:49 AM  Clinical Narrative:                 10:49am-CSW received consult for possible SNF placement at time of discharge. CSW spoke with patient regarding PT recommendation of SNF placement at time of discharge. Patient reported that patient's daughter and granddaughter are staying with her but are currently unable to care for patient since they work and given patient's current physical needs and fall risk. Patient expressed understanding of PT recommendation and is agreeable to SNF placement at time of discharge. CSW discussed insurance authorization process and provided Medicare SNF ratings list. Patient expressed being hopeful for rehab and to feel better soon. No further questions reported at this time. Updated COVID test requested in anticipation of potential discharge tomorrow. CSW to continue to follow and assist with discharge planning needs.  3pm-CSW provided SNF list to patient for review. Patient requests CSW contact her daughter to see if she can assist with selecting a facility, however, daughter's numbers are not working. Patient states she will see if she can call her cousin to get a number as she is unable to remember it.    Expected Discharge Plan: Skilled Nursing Facility Barriers to Discharge: Continued Medical Work up   Patient Goals and CMS Choice Patient states their goals for this hospitalization and ongoing recovery are:: Rehab CMS Medicare.gov Compare Post Acute Care list provided to:: Patient Choice offered to / list presented to : Patient  Expected Discharge Plan and Services Expected Discharge Plan: [place] In-house Referral: Clinical Social Work   Post Acute Care Choice: [place] Living arrangements for  the past 2 months: Apartment                                      Prior Living Arrangements/Services Living arrangements for the past 2 months: Apartment Lives with:: Self, Adult Children Patient language and need for interpreter reviewed:: Yes        Need for Family Participation in Patient Care: Yes (Comment) Care giver support system in place?: Yes (comment)   Criminal Activity/Legal Involvement Pertinent to Current Situation/Hospitalization: No - Comment as needed  Activities of Daily Living Home Assistive Devices/Equipment: Cane (specify quad or straight), Blood pressure cuff ADL Screening (condition at time of admission) Patient's cognitive ability adequate to safely complete daily activities?: Yes Is the patient deaf or have difficulty hearing?: No Does the patient have difficulty seeing, even when wearing glasses/contacts?: No Does the patient have difficulty concentrating, remembering, or making decisions?: No Patient able to express need for assistance with ADLs?: Yes Does the patient have difficulty dressing or bathing?: No Independently performs ADLs?: Yes (appropriate for developmental age) Does the patient have difficulty walking or climbing stairs?: No Weakness of Legs: Both Weakness of Arms/Hands: None  Permission Sought/Granted Permission sought to share information with : Facility Sport and exercise psychologist, Family Supports Permission granted to share information with : Yes, Verbal Permission Granted  Share Information with NAME: Sharlet Salina  Permission granted to share info w AGENCY: SNFs  Permission granted to share info w Relationship: Daughter  Permission granted to share info w Contact Information: [phone]  Emotional Assessment Appearance:: Appears stated age Attitude/Demeanor/Rapport:  Engaged, Gracious Affect (typically observed): Accepting, Appropriate, Pleasant Orientation: : Oriented to Self, Oriented to Place, Oriented to  Time, Oriented to  Situation Alcohol / Substance Use: Not Applicable Psych Involvement: No (comment)  Admission diagnosis:  Cholecystitis [K81.9] Colitis [K52.9] Sepsis ([place]) [A41.9] Acute gastritis without hemorrhage, unspecified gastritis type [K29.00] Aspiration pneumonia of right lower lobe, unspecified aspiration pneumonia type ([place]) [J69.0] Sepsis, due to unspecified organism, unspecified whether acute organ dysfunction present [facility]) [A41.9] Patient Active Problem List   Diagnosis Date Noted  . Sepsis ([place]) 05/23/2019  . Hypomagnesemia 05/23/2019  . Hypokalemia 05/23/2019  . Obesity (BMI 30-39.9) 05/23/2019  . History of prediabetes 05/23/2019  . Right lower lobe pneumonia 05/23/2019  . Elevated glucose level   . Acute gallstone pancreatitis 12/06/2017  . Cholelithiasis with acute cholecystitis 12/06/2017  . Essential hypertension 12/06/2017  . Asthma 12/06/2017  . Abnormal magnetic resonance cholangiopancreatography (MRCP)   . Choledocholithiasis    PCP:  Patient, No Pcp Per Pharmacy:   [facility] [place], [place] - [address] [place] [address] Phone: [phone] Fax: [phone]     Social Determinants of Health (SDOH) Interventions    Readmission Risk Interventions No flowsheet data found.

## 2019-05-27 NOTE — Telephone Encounter (Signed)
-----   Message from Landis Gandy, RN sent at 05/27/2019 12:22 PM EDT ----- Regarding: RE: hospital follow up Hwo about may 12? ----- Message ----- From: Cassell Smiles Sent: 05/27/2019   9:40 AM EDT To: Landis Gandy, RN Subject: RE: hospital follow up                         Dr.Snider is not here in 3 or 4 weeks, shes here the week of May 10th or until June 7.  ----- Message ----- From: Landis Gandy, RN Sent: 05/27/2019   9:16 AM EDT To: Lenell Antu, # Subject: hospital follow up                             Hello, Can you please schedule this patient with Dr Baxter Flattery? Thanks! Sharyn Lull ----- Message ----- From: Carlyle Basques, MD Sent: 05/26/2019  10:41 PM EDT To: [facility] Triage Nurse Pool  Can I see her in my clinic in 3-4 wk

## 2019-05-28 DIAGNOSIS — I10 Essential (primary) hypertension: Secondary | ICD-10-CM | POA: Diagnosis not present

## 2019-05-28 DIAGNOSIS — J690 Pneumonitis due to inhalation of food and vomit: Secondary | ICD-10-CM | POA: Diagnosis not present

## 2019-05-28 DIAGNOSIS — R1312 Dysphagia, oropharyngeal phase: Secondary | ICD-10-CM | POA: Diagnosis not present

## 2019-05-28 DIAGNOSIS — E1169 Type 2 diabetes mellitus with other specified complication: Secondary | ICD-10-CM | POA: Diagnosis not present

## 2019-05-28 DIAGNOSIS — J188 Other pneumonia, unspecified organism: Secondary | ICD-10-CM | POA: Diagnosis not present

## 2019-05-28 DIAGNOSIS — E668 Other obesity: Secondary | ICD-10-CM | POA: Diagnosis not present

## 2019-05-28 DIAGNOSIS — K8050 Calculus of bile duct without cholangitis or cholecystitis without obstruction: Secondary | ICD-10-CM | POA: Diagnosis not present

## 2019-05-28 DIAGNOSIS — J984 Other disorders of lung: Secondary | ICD-10-CM | POA: Diagnosis not present

## 2019-05-28 DIAGNOSIS — K8000 Calculus of gallbladder with acute cholecystitis without obstruction: Secondary | ICD-10-CM | POA: Diagnosis not present

## 2019-05-28 DIAGNOSIS — M6281 Muscle weakness (generalized): Secondary | ICD-10-CM | POA: Diagnosis not present

## 2019-05-28 DIAGNOSIS — K5909 Other constipation: Secondary | ICD-10-CM | POA: Diagnosis not present

## 2019-05-28 DIAGNOSIS — K8020 Calculus of gallbladder without cholecystitis without obstruction: Secondary | ICD-10-CM | POA: Diagnosis not present

## 2019-05-28 DIAGNOSIS — K648 Other hemorrhoids: Secondary | ICD-10-CM | POA: Diagnosis not present

## 2019-05-28 DIAGNOSIS — A419 Sepsis, unspecified organism: Secondary | ICD-10-CM | POA: Diagnosis not present

## 2019-05-28 DIAGNOSIS — R41841 Cognitive communication deficit: Secondary | ICD-10-CM | POA: Diagnosis not present

## 2019-05-28 DIAGNOSIS — A4189 Other specified sepsis: Secondary | ICD-10-CM | POA: Diagnosis not present

## 2019-05-28 DIAGNOSIS — Z434 Encounter for attention to other artificial openings of digestive tract: Secondary | ICD-10-CM | POA: Diagnosis not present

## 2019-05-28 DIAGNOSIS — K819 Cholecystitis, unspecified: Secondary | ICD-10-CM | POA: Diagnosis not present

## 2019-05-28 DIAGNOSIS — E878 Other disorders of electrolyte and fluid balance, not elsewhere classified: Secondary | ICD-10-CM | POA: Diagnosis not present

## 2019-05-28 DIAGNOSIS — N178 Other acute kidney failure: Secondary | ICD-10-CM | POA: Diagnosis not present

## 2019-05-28 DIAGNOSIS — J45909 Unspecified asthma, uncomplicated: Secondary | ICD-10-CM | POA: Diagnosis not present

## 2019-05-28 DIAGNOSIS — R6520 Severe sepsis without septic shock: Secondary | ICD-10-CM | POA: Diagnosis not present

## 2019-05-28 DIAGNOSIS — Z86018 Personal history of other benign neoplasm: Secondary | ICD-10-CM | POA: Diagnosis not present

## 2019-05-28 DIAGNOSIS — K810 Acute cholecystitis: Secondary | ICD-10-CM | POA: Diagnosis not present

## 2019-05-28 LAB — CBC
HCT: 35.9 % — ABNORMAL LOW (ref 36.0–46.0)
Hemoglobin: 11.7 g/dL — ABNORMAL LOW (ref 12.0–15.0)
MCH: 29.5 pg (ref 26.0–34.0)
MCHC: 32.6 g/dL (ref 30.0–36.0)
MCV: 90.7 fL (ref 80.0–100.0)
Platelets: 220 10*3/uL (ref 150–400)
RBC: 3.96 MIL/uL (ref 3.87–5.11)
RDW: 14.5 % (ref 11.5–15.5)
WBC: 10.3 10*3/uL (ref 4.0–10.5)
nRBC: 0.2 % (ref 0.0–0.2)

## 2019-05-28 LAB — BASIC METABOLIC PANEL
Anion gap: 5 (ref 5–15)
BUN: 7 mg/dL — ABNORMAL LOW (ref 8–23)
CO2: 23 mmol/L (ref 22–32)
Calcium: 7.9 mg/dL — ABNORMAL LOW (ref 8.9–10.3)
Chloride: 110 mmol/L (ref 98–111)
Creatinine, Ser: 0.79 mg/dL (ref 0.44–1.00)
GFR calc Af Amer: >60 mL/min (ref >60)
GFR calc non Af Amer: >60 mL/min (ref >60)
Glucose, Bld: 89 mg/dL (ref 70–99)
Potassium: 3.5 mmol/L (ref 3.5–5.1)
Sodium: 138 mmol/L (ref 135–145)

## 2019-05-28 MED ORDER — POLYETHYLENE GLYCOL 3350 17 G PO PACK: 17.0000 g | PACK | Freq: Every day | ORAL | 0 refills | Status: AC

## 2019-05-28 MED ORDER — POTASSIUM CHLORIDE ER 10 MEQ PO TBCR: 10.0000 meq | EXTENDED_RELEASE_TABLET | Freq: Every day | ORAL | Status: AC

## 2019-05-28 MED ORDER — POTASSIUM CHLORIDE CRYS ER 20 MEQ PO TBCR
40.0000 meq | EXTENDED_RELEASE_TABLET | ORAL | Status: AC
  Administered 2019-05-28 (×2): 40 meq via ORAL
  Filled 2019-05-28 (×2): qty 2

## 2019-05-28 MED ORDER — SENNOSIDES-DOCUSATE SODIUM 8.6-50 MG PO TABS: 2.0000 | ORAL_TABLET | Freq: Two times a day (BID) | ORAL | Status: AC

## 2019-05-28 MED ORDER — SODIUM CHLORIDE 0.9 % IV SOLN: 2.0000 g | INTRAVENOUS | Status: AC

## 2019-05-28 MED ORDER — ACETAMINOPHEN 325 MG PO TABS: 650.0000 mg | ORAL_TABLET | Freq: Four times a day (QID) | ORAL | Status: DC | PRN

## 2019-05-28 NOTE — TOC Transition Note (Signed)
Transition of Care [facility]) - CM/SW Discharge Note   Patient Details  Name: Gabriella Butler MRN: [id number] Date of Birth: 1945/05/22  Transition of Care [facility]) CM/SW Contact:  Benard Halsted, LCSW Phone Number: 05/28/2019, 11:55 AM   Clinical Narrative:    Patient will DC to: [place] Anticipated DC date: 05/28/19 Family notified: Daughter, Horticulturist, commercial by: Ernst Spell   Per MD patient ready for DC to [place]. RN, patient, patient's family, and facility notified of DC. Discharge Summary and FL2 sent to facility. CSW confirmed they can accommodate a midline. RN to call report prior to discharge [phone] Room 101P).   CSW will sign off for now as social work intervention is no longer needed. Please consult [place] again if new needs arise.      Final next level of care: Skilled Nursing Facility Barriers to Discharge: No Barriers Identified   Patient Goals and CMS Choice Patient states their goals for this hospitalization and ongoing recovery are:: Rehab CMS Medicare.gov Compare Post Acute Care list provided to:: Patient Choice offered to / list presented to : Patient  Discharge Placement   Existing PASRR number confirmed : 05/28/19          Patient chooses bed at: [facility] Patient to be transferred to facility by: Daughter by car Name of family member notified: Daughter, Sharlet Salina Patient and family notified of of transfer: 05/28/19  Discharge Plan and Services In-house Referral: Clinical Social Work   Post Acute Care Choice: [place]                               Social Determinants of Health (SDOH) Interventions     Readmission Risk Interventions No flowsheet data found.

## 2019-05-28 NOTE — Progress Notes (Addendum)
Referring Physician(s): [place] Silos, B.  Supervising Physician: Aletta Edouard  Patient Status:  [facility] - In-pt  Chief Complaint: abdominal pain/choleycystitis  Subjective: Patient doing well this morning, very friendly and cheerful. States she is hoping to be discharged soon to [facility].  Allergies: Patient has no known allergies.  Medications: Prior to Admission medications   Medication Sig Start Date End Date Taking? Authorizing Provider  albuterol (VENTOLIN HFA) 108 (90 Base) MCG/ACT inhaler Inhale 1-2 puffs into the lungs every 4 (four) hours as needed for wheezing or shortness of breath. Patient not taking: Reported on 05/23/2019 04/23/19   Sherwood Gambler, MD  beclomethasone (QVAR) 40 MCG/ACT inhaler Inhale 1 puff into the lungs 2 (two) times daily. Patient not taking: Reported on 04/22/2019 02/04/17   Dorie Rank, MD  Cetirizine HCl 10 MG CAPS Take 1 capsule (10 mg total) by mouth daily for 10 days. Patient not taking: Reported on 12/06/2017 07/12/17 07/22/17  Wieters, Hallie C, PA-C  ondansetron (ZOFRAN) 4 MG tablet Take 1 tablet (4 mg total) by mouth every 6 (six) hours as needed for nausea. Patient not taking: Reported on 04/22/2019 12/07/17   Raiford Noble Latif, DO  traMADol (ULTRAM) 50 MG tablet Take 1 tablet (50 mg total) by mouth every 6 (six) hours as needed. Patient not taking: Reported on 12/06/2017 02/04/17   Dorie Rank, MD     Vital Signs: BP (!) 150/89 (BP Location: Right Arm)   Pulse 80   Temp 99.2 F (37.3 C) (Oral)   Resp 18   Ht 5\' 5"  (1.651 m)   Wt 200 lb 9.9 oz (91 kg)   SpO2 97%   BMI 33.38 kg/m   Physical Exam: Patient A&O x 3, gallbladder drain is intact. Insertion site is clean and dry without redness, warmth or swelling. Patient denies pain or discomfort around the insertion site. Approximately 40 cc of serosanguineous fluid in the bag. Flushes easily. 75 cc output on the flow sheet.   Imaging: [place] EKG SITE RITE  Result Date: 05/26/2019 If Site  Rite image not attached, placement could not be confirmed due to current cardiac rhythm.   Labs:  CBC: Recent Labs    05/24/19 0759 05/25/19 0354 05/27/19 0402 05/28/19 0411  WBC 14.5* 11.7* 9.3 10.3  HGB 13.4 11.6* 11.7* 11.7*  HCT 41.9 35.6* 36.2 35.9*  PLT 144* 172 196 220    COAGS: Recent Labs    05/23/19 0253  INR 1.2  APTT 28    BMP: Recent Labs    05/24/19 0759 05/25/19 0354 05/27/19 0402 05/28/19 0411  NA 139 141 139 138  K 4.2 3.2* 3.2* 3.5  CL 112* 114* 109 110  CO2 16* 20* 22 23  GLUCOSE 114* 100* 89 89  BUN 17 14 9  7*  CALCIUM 8.3* 8.3* 8.1* 7.9*  CREATININE 1.12* 0.87 0.84 0.79  GFRNONAA 49* >60 >60 >60  GFRAA 57* >60 >60 >60    LIVER FUNCTION TESTS: Recent Labs    04/22/19 2157 05/23/19 0253 05/24/19 0759 05/25/19 0354  BILITOT 0.5 1.1 1.5* 0.6  AST 20 25 31 16   ALT 18 18 26 16   ALKPHOS 61 180* 64 61  PROT 8.1 7.3 6.3* 5.8*  ALBUMIN 3.9 3.0* 2.3* 2.1*    Assessment and Plan:  Patient with history of acute calculus cholecystitis/sepsis; poor surgical candidate. She is status post gallbladder drain 05/23/2019. Klebsiella growing in culture. Continue to irrigate the drain. Drain will remain in place for at least 4-6 weeks;  will schedule for follow up gall bladder drain injection in IR clinic in 4 weeks. Patient with plans to be discharged to [facility] today or tomorrow. Antibiotics/other plans per [facility].  Will need drain to be flushed daily 5-10 cc sterile saline.   Electronically Signed: Theresa Duty, NP 05/28/2019, 10:12 AM   I spent a total of 15 Minutes at the the patient's bedside AND on the patient's hospital floor or unit, greater than 50% of which was counseling/coordinating care for gallbladder drain.

## 2019-05-28 NOTE — Care Management Important Message (Signed)
Important Message  Patient Details  Name: Gabriella Butler MRN: [id number] Date of Birth: 1945/04/17   Medicare Important Message Given:  Yes - Important Message mailed due to current National Emergency  Verbal consent obtained due to current National Emergency  Relationship to patient: Self Contact Name: Christalle Barrer Call Date: 05/28/19  Time: 1324 Phone: [phone] Outcome: Spoke with contact Important Message mailed to: Patient address on file    [place] 05/28/2019, 1:25 PM

## 2019-05-28 NOTE — Progress Notes (Signed)
Gabriella Butler to be D/C'd Gabriella Butler per MD order.  Discussed with the patient and all questions fully answered.  VSS, Skin clean, dry and intact without evidence of skin break down, no evidence of skin tears noted. IV catheter discontinued intact. Site without signs and symptoms of complications. Dressing and pressure applied.  An After Visit Summary was printed and given to the patient. Patient received prescription.  D/c education completed with patient/family including follow up instructions, medication list, d/c activities limitations if indicated, with other d/c instructions as indicated by MD - patient able to verbalize understanding, all questions fully answered.   Patient instructed to return to ED, call 911, or call MD for any changes in condition.   Patient escorted via Gabriella Butler, and D/C home via private auto.  IV midline  flushed and report given to nurse Gabriella Butler  From [place] prior to D/C home.    Gabriella Butler Gabriella Butler 05/28/2019 1:30 PM

## 2019-05-28 NOTE — Progress Notes (Signed)
Spoke with [facility] and made him aware that midlines do not need to be heparinized prior to discharge. He is okay to flush with saline.

## 2019-05-28 NOTE — TOC Progression Note (Addendum)
Transition of Care [facility]) - Progression Note    Patient Details  Name: Gabriella Butler MRN: [id number] Date of Birth: 1945-06-10  Transition of Care [facility]) CM/SW [place], LCSW Phone Number: 05/28/2019, 8:47 AM  Clinical Narrative:    8:47am-CSW received call from patient's daughter, Mickel Baas (contact info updated in Epic). She and patient have selected [facility]. CSW reaching out to [place] regarding bed availability. COVID test was not drawn yesterday, so also checking to make sure it is required by [facility] if patient is ready for discharge today.     9:45am-Per [place], no updated COVID test needed and patient will go into quarantine since she has not had any COVID vaccines. Daughter aware of no visitation policy and will be transporting patient by car.    Expected Discharge Plan: [place] Barriers to Discharge: Continued Medical Work up  Expected Discharge Plan and Services Expected Discharge Plan: [place] In-house Referral: Clinical Social Work   Post Acute Care Choice: [place] Living arrangements for the past 2 months: Apartment                                       Social Determinants of Health (SDOH) Interventions    Readmission Risk Interventions No flowsheet data found.

## 2019-05-28 NOTE — Discharge Summary (Signed)
Gabriella Butler, is a 74 y.o. female  DOB 1945-05-03  MRN [id number].  Admission date:  05/23/2019  Admitting Physician  Norval Morton, MD  Discharge Date:  05/28/2019   Primary MD  Patient, No Pcp Per  Recommendations for primary care physician for things to follow:  -Please check CBC, CMP in 3 days.  Is on potassium supplements for next 2 weeks, adjust as needed -Monitor for constipation, adjust bowel regimen as needed. -Although with IR in 4 weeks, they will arrange for follow-up, meanwhile continue with drain care per instructions. -Patient to follow with general surgery in early June regarding scheduling laparoscopic cholecystectomy surgery. -Please discontinue midline after she finishes her last dose of IV Rocephin on 06/04/2019.  Admission Diagnosis  Cholecystitis [K81.9] Colitis [K52.9] Sepsis ([place]) [A41.9] Acute gastritis without hemorrhage, unspecified gastritis type [K29.00] Aspiration pneumonia of right lower lobe, unspecified aspiration pneumonia type ([place]) [J69.0] Sepsis, due to unspecified organism, unspecified whether acute organ dysfunction present [facility]) [A41.9]   Discharge Diagnosis  Cholecystitis [K81.9] Colitis [K52.9] Sepsis ([place]) [A41.9] Acute gastritis without hemorrhage, unspecified gastritis type [K29.00] Aspiration pneumonia of right lower lobe, unspecified aspiration pneumonia type ([place]) [J69.0] Sepsis, due to unspecified organism, unspecified whether acute organ dysfunction present [facility]) [A41.9]    Principal Problem:   Sepsis ([place]) Active Problems:   Cholelithiasis with acute cholecystitis   Asthma   Hypomagnesemia   Hypokalemia   Obesity (BMI 30-39.9)   History of prediabetes   Right lower lobe pneumonia      Past Medical History:  Diagnosis Date  . Asthma   . Bronchitis   . Gout   . Hypertension     Past Surgical History:  Procedure Laterality Date  . IR  PERC CHOLECYSTOSTOMY  05/23/2019       History of present illness and  Hospital Course:     Kindly see H&P for history of present illness and admission details, please review complete Labs, Consult reports and Test reports for all details in brief  HPI  from the history and physical done on the day of admission 05/23/2019  HPI: Gabriella Butler is a 74 y.o. female with medical history significant of hypertension, asthma with bronchitis, gout, cholelithiasis, and gallstone pancreatitis presents with complaints of abdominal pain over the last couple days.  History is obtained mostly from the patient's daughter who is at bedside and lives with her at home.  She described the abdominal pain is located all over with radiation to her back.  Patient admitted that abdominal pain worsened after eating.  Associated symptoms include fever, generalized malaise, nausea, vomiting, mild intermittently productive cough, short and lethargy.    Patient last was hospitalized back in 12/2017 with complaints of abdominal pain found to cholelithiasis with gallstone pancreatitis.  At that time ERCP was recommended by GI as well as surgical intervention by general surgery, but patient refused both.  Gabriella Butler reports that she was scared of having any kind of procedure.  Ultimately she was placed on empiric antibiotics and recommended to follow-up  in the outpatient setting, but never followed up.  En route with EMS patient was noted to have a blood pressure of 77/48, heart rate 140, respirations 93% on room air, and CBG, 202.   ED Course: Upon admission into the emergency department patient was seen to be febrile up to 103.3 F, pulse 113-138, respirations up to 37, blood pressure as low as 74/50 improved with IV fluids to 108/68, and O2 saturations maintained on room air.  Labs significant for WBC 10.2 with left shift, potassium 3.3, CO2 17, BUN 24, creatinine 1.77, glucose 186, magnesium 1.6, lactic acid 3.8-> 3.6.  Chest x-ray  significant for lingular atelectasis.  CT scan of the abdomen and pelvis revealed diffuse inflammatory changes concerning for acute cholecystitis with signs of right lobe pneumonia versus aspiration.  General surgery was formally consulted.  Blood cultures were obtained.  COVID-19 and influenza screen were both negative.  Patient was given 40 mEq of potassium chloride IV, naloxone, metronidazole, vancomycin, and cefepime.   Hospital Course    Sepsissecondary tocholelithiasis with acutecholecystitis/bacteremia: -Is present on admission with fever up to 103.3 F, tachycardic, tachypneic, and hypotensive. Labs significant for WBC 10.2 with signs of left shift and lactic acid elevated up to 3.8.  - CT revealed gallbladder wall thickening and cholelithiasis concerning for acute cholecystitis. General surgery have been formally consulted, recommendation for percutaneous cholecystectomy drain.  Placed by IR 4/22.  Commendation is to continue with percutaneous drain for 4 to 6 weeks, then laparoscopic cholecystectomy can be done as an outpatient, self for now plan is to discharge to SNF, with drain care, and I will arrange for outpatient follow-up, as well general surgery will see in early June to schedule for outpatient laparoscopic cholecystectomy, overall her pain has resolved, she did not require any narcotics or Tylenol over last 2 days. -Continue with regular diet -Blood culture and had percutaneous cholecystotomy drain sample both growing growing Klebsiella oxytoca, and Streptococcus Anginosis, patient was treated with IV Rocephin and Flagyl during hospital stay, discussed with ID, recommendation to continue with IV Rocephin till 5/4, patient already has midline.  Right lower lobe pneumonia:  - Acute.Patient reports having intermittent productive cough.  -Antibiotics as seen above -Was encouraged use incentive spirometry  Acute kidney injury:  Patient presents with creatinine elevated up  to 1.77 with BUN 24. Baseline previously noted to be 0.98 just 27-month ago.  Has resolved.  Hyperglycemia, history of prediabetes: -  Acute. Glucose on admission elevated to 186. Last hemoglobin A1c noted to be 6.2 in 12/2017. - A1c is 6.6  Hypokalemia:  -Pleated during hospital stay, she will be discharged on low-dose potassium for next 14 days, please monitor BMP closely.  Hypomagnesemia:  - repleted  Elevated alkaline phosphatase:  - Trending down.  History of asthma:  - Patient currently not wheezing.Appears to have not been taking Qvar or albuterol at home. -Albuterol as needed for shortness of breath/wheezing  Obesity: BMI 33.38 kg/m    Discharge Condition:   Stable   Follow UP   Contact information for follow-up providers    Georganna Skeans, MD Follow up.   Specialty: General Surgery Why: CAll for an appointment the first week in June. Contact information: [address] [place] [address] [phone]        Jacqulynn Cadet, MD Follow up in 4 week(s).   Specialties: Interventional Radiology, Radiology Why: Patient to flush drain 5-10 cc sterile saline daily. She will hear from IR scheduler with follow up  appointment date and time with Dr. Laurence Ferrari. Call [phone] with any questions or concerns.  Contact information: [address] [place] [address] [phone]            Contact information for after-discharge care    Destination    [facility] .   Service: Skilled Nursing Contact information: [place] [phone]                    Discharge Instructions  and  Discharge Medications    Discharge Instructions    Discharge instructions   Complete by: As directed    Follow with SNF physician.  Get CBC, CMP, checked  by Primary MD next visit.    Activity: As tolerated with Full fall precautions use walker/cane & assistance  as needed   Disposition SNF   Diet: Regular diet. , with feeding assistance and aspiration precautions.   On your next visit with your primary care physician please Get Medicines reviewed and adjusted.   Please request your Prim.MD to go over all Hospital Tests and Procedure/Radiological results at the follow up, please get all Hospital records sent to your Prim MD by signing hospital release before you go home.   If you experience worsening of your admission symptoms, develop shortness of breath, life threatening emergency, suicidal or homicidal thoughts you must seek medical attention immediately by calling 911 or calling your MD immediately  if symptoms less severe.  You Must read complete instructions/literature along with all the possible adverse reactions/side effects for all the Medicines you take and that have been prescribed to you. Take any new Medicines after you have completely understood and accpet all the possible adverse reactions/side effects.   Do not drive, operating heavy machinery, perform activities at heights, swimming or participation in water activities or provide baby sitting services if your were admitted for syncope or siezures until you have seen by Primary MD or a Neurologist and advised to do so again.  Do not drive when taking Pain medications.    Do not take more than prescribed Pain, Sleep and Anxiety Medications  Special Instructions: If you have smoked or chewed Tobacco  in the last 2 yrs please stop smoking, stop any regular Alcohol  and or any Recreational drug use.  Wear Seat belts while driving.   Please note  You were cared for by a hospitalist during your hospital stay. If you have any questions about your discharge medications or the care you received while you were in the hospital after you are discharged, you can call the unit and asked to speak with the hospitalist on call if the hospitalist that took care of you is not available. Once you  are discharged, your primary care physician will handle any further medical issues. Please note that NO REFILLS for any discharge medications will be authorized once you are discharged, as it is imperative that you return to your primary care physician (or establish a relationship with a primary care physician if you do not have one) for your aftercare needs so that they can reassess your need for medications and monitor your lab values.   Discharge wound care:   Complete by: As directed    Continue to irrigate the drain percutaneous cholecystostomy tube, will need drain to be flushed daily 5 to 10 cc of sterile saline . Drain will remain in place for at least 4-6 weeks; IR will schedule for follow up  gall bladder drain injection in IR clinic in 4 weeks.  Will need drain to be flushed daily 5-10 cc sterile saline.   Increase activity slowly   Complete by: As directed      Allergies as of 05/28/2019   No Known Allergies     Medication List    STOP taking these medications   Cetirizine HCl 10 MG Caps   traMADol 50 MG tablet Commonly known as: ULTRAM     TAKE these medications   acetaminophen 325 MG tablet Commonly known as: TYLENOL Take 2 tablets (650 mg total) by mouth every 6 (six) hours as needed for mild pain (or Fever >/= 101).   albuterol 108 (90 Base) MCG/ACT inhaler Commonly known as: VENTOLIN HFA Inhale 1-2 puffs into the lungs every 4 (four) hours as needed for wheezing or shortness of breath.   beclomethasone 40 MCG/ACT inhaler Commonly known as: QVAR Inhale 1 puff into the lungs 2 (two) times daily.   cefTRIAXone 2 g in sodium chloride 0.9 % 100 mL Inject 2 g into the vein daily for 7 days. Please continue until 06/04/2019 discontinue after that, please discontinue midline as well   ondansetron 4 MG tablet Commonly known as: ZOFRAN Take 1 tablet (4 mg total) by mouth every 6 (six) hours as needed for nausea.   polyethylene glycol 17 g packet Commonly known as:  MIRALAX / GLYCOLAX Take 17 g by mouth daily. Please hold for diarrhea   potassium chloride 10 MEQ tablet Commonly known as: KLOR-CON Take 1 tablet (10 mEq total) by mouth daily. Start taking on: Jun 11, 2019   senna-docusate 8.6-50 MG tablet Commonly known as: Senokot-S Take 2 tablets by mouth 2 (two) times daily.            Discharge Care Instructions  (From admission, onward)         Start     Ordered   05/28/19 0000  Discharge wound care:    Comments: Continue to irrigate the drain percutaneous cholecystostomy tube, will need drain to be flushed daily 5 to 10 cc of sterile saline . Drain will remain in place for at least 4-6 weeks; IR will schedule for follow up gall bladder drain injection in IR clinic in 4 weeks.  Will need drain to be flushed daily 5-10 cc sterile saline.   05/28/19 1108            Diet and Activity recommendation: See Discharge Instructions above   Consults obtained -  IR General  surgery   Major procedures and Radiology Reports - PLEASE review detailed and final reports for all details, in brief -      IR Perc Cholecystostomy  Result Date: 05/23/2019 INDICATION: 74 year old female with acute calculus cholecystitis and acute sepsis. She is considered a high risk surgical candidate at this time and therefore presents for percutaneous cholecystostomy tube placement. EXAM: CHOLECYSTOSTOMY MEDICATIONS: Patient is currently an inpatient and receiving intravenous antibiotics. No additional antibiotic coverage was provided. ANESTHESIA/SEDATION: Moderate (conscious) sedation was employed during this procedure. A total of Versed 1 mg, 0.5 mg Dilaudid and Fentanyl 50 mcg was administered intravenously. Moderate Sedation Time: 19 minutes. The patient's level of consciousness and vital signs were monitored continuously by radiology nursing throughout the procedure under my direct supervision. FLUOROSCOPY TIME:  Fluoroscopy Time: 1 minutes 36 seconds (17  mGy). COMPLICATIONS: None immediate. PROCEDURE: Informed written consent was obtained from the patient after a thorough discussion of the procedural risks, benefits and alternatives. All questions  were addressed. Maximal Sterile Barrier Technique was utilized including caps, mask, sterile gowns, sterile gloves, sterile drape, hand hygiene and skin antiseptic. A timeout was performed prior to the initiation of the procedure. The right upper quadrant was interrogated with ultrasound. The hydropic gallbladder containing multiple stones was successfully identified. A suitable skin entry site was selected and marked. The overlying skin was sterilely prepped and draped in the standard fashion using chlorhexidine skin prep. Local anesthesia was attained by infiltration with 1% lidocaine. Under real-time ultrasound guidance, a 21 gauge Accustick needle was advanced along a short transhepatic course and into the gallbladder lumen. A wire was advanced into the gallbladder lumen. The needle was exchanged for the transitional peel-away sheath which was successfully advanced into the gallbladder lumen. A gentle hand injection of contrast material opacifies the bladder lumen. An Amplatz wire was then coiled in the gallbladder. The transitional sheath was removed. The transhepatic tract was dilated to 10 [place] and a Cook 10.2 [place] all-purpose drainage catheter was advanced over the wire and formed in the gallbladder. There was copious return of foul-smelling purulent bile. Samples were aspirated and sent for Gram stain and culture. The catheter was connected to gravity bag drainage and then secured to the skin with 0 Prolene suture. Sterile bandages were applied. IMPRESSION: Successful placement of a transhepatic 10 French percutaneous cholecystostomy tube for the indication of acute calculus cholecystitis in a septic, high risk patient who is a poor candidate for surgery. Electronically Signed   By: Jacqulynn Cadet M.D.    On: 05/23/2019 17:37   DG Chest Port 1 View  Result Date: 05/23/2019 CLINICAL DATA:  Shortness of breath, sepsis EXAM: PORTABLE CHEST 1 VIEW COMPARISON:  04/22/2019 FINDINGS: Lingular atelectasis. Right lung clear. Heart is normal size. No effusions or acute bony abnormality. IMPRESSION: Lingular atelectasis. Electronically Signed   By: Rolm Baptise M.D.   On: 05/23/2019 03:20   CT Renal Stone Study  Result Date: 05/23/2019 CLINICAL DATA:  Malaise. Diffuse abdominal pain. EXAM: CT ABDOMEN AND PELVIS WITHOUT CONTRAST TECHNIQUE: Multidetector CT imaging of the abdomen and pelvis was performed following the standard protocol without IV contrast. COMPARISON:  Abdominal ultrasound 12/06/2017 FINDINGS: Lower chest: Asymmetric right lower lobe airspace disease is present. Minimal atelectasis is present at the left base. The heart size is normal. No significant pleural or pericardial effusion is present. Hepatobiliary: Liver is within normal limits. Numerous gallstones are present. Inflammatory changes are evident about the gallbladder with gallbladder wall thickening. Common bile duct is unremarkable. Pancreas: Unremarkable. No pancreatic ductal dilatation or surrounding inflammatory changes. Spleen: Normal in size without focal abnormality. Adrenals/Urinary Tract: Adrenal glands are normal bilaterally. Kidneys and ureters are within normal limits. No stone or mass lesion is present. Urinary bladder is normal, collapsed. Stomach/Bowel: Inflammatory changes are noted in the gastric antrum and proximal duodenum. No discrete ulcer or abscess is present. Small bowel is within normal limits. Terminal ileum is normal. Inflammatory changes are present at the hepatic flexure. Transverse colon is normal. Descending and sigmoid colon are normal. Vascular/Lymphatic: Atherosclerotic changes are noted in the aorta branch vessels without aneurysm. Reproductive: Fibroid uterus is present. Uterus and adnexa are otherwise within  limits. Other: No abdominal wall hernia or abnormality. No abdominopelvic ascites. Musculoskeletal: Degenerative changes are present the lumbar spine. Facet hypertrophy contributes to foraminal stenosis bilaterally at L5-S1. Vertebral body heights are maintained. No focal lytic or blastic lesions are present. Bony pelvis is within normal limits. The hips are located and within limits bilaterally.  IMPRESSION: 1. Inflammatory changes about the gallbladder with gallbladder wall thickening and cholelithiasis compatible with acute cholecystitis. 2. Diffuse inflammatory changes are also present at the gastric antrum and duodenum. This may represent acute duodenitis versus cholecystitis. 3. Inflammatory changes at the hepatic flexure of the colon are likely secondary. 4. Asymmetric right lower lobe airspace disease concerning for pneumonia or aspiration. 5. Fibroid uterus. 6. Aortic Atherosclerosis (ICD10-I70.0). Electronically Signed   By: San Morelle M.D.   On: 05/23/2019 04:54   [place] EKG SITE RITE  Result Date: 05/26/2019 If Site Rite image not attached, placement could not be confirmed due to current cardiac rhythm.   Micro Results     Recent Results (from the past 240 hour(s))  Blood Culture (routine x 2)     Status: Abnormal   Collection Time: 05/23/19  2:48 AM   Specimen: BLOOD  Result Value Ref Range Status   Specimen Description BLOOD RIGHT ANTECUBITAL  Final   Special Requests   Final    BOTTLES DRAWN AEROBIC AND ANAEROBIC Blood Culture adequate volume   Culture  Setup Time   Final    IN BOTH AEROBIC AND ANAEROBIC BOTTLES GRAM POSITIVE COCCI IN CHAINS GRAM NEGATIVE RODS CRITICAL RESULT CALLED TO, READ BACK BY AND VERIFIED WITH: Hughie Closs [facility] 05/23/19 2006 JDW Performed at [facility], [place] [address] [place], [place] [address]    Culture KLEBSIELLA OXYTOCA STREPTOCOCCUS ANGINOSIS  (A)  Final   Report Status 05/27/2019 FINAL  Final   Organism ID, Bacteria KLEBSIELLA  OXYTOCA  Final   Organism ID, Bacteria STREPTOCOCCUS ANGINOSIS  Final      Susceptibility   Klebsiella oxytoca - MIC*    AMPICILLIN >=32 RESISTANT Resistant     CEFAZOLIN <=4 SENSITIVE Sensitive     CEFEPIME <=1 SENSITIVE Sensitive     CEFTAZIDIME <=1 SENSITIVE Sensitive     CEFTRIAXONE <=1 SENSITIVE Sensitive     CIPROFLOXACIN <=0.25 SENSITIVE Sensitive     GENTAMICIN <=1 SENSITIVE Sensitive     IMIPENEM <=0.25 SENSITIVE Sensitive     TRIMETH/SULFA <=20 SENSITIVE Sensitive     AMPICILLIN/SULBACTAM 8 SENSITIVE Sensitive     PIP/TAZO <=4 SENSITIVE Sensitive     * KLEBSIELLA OXYTOCA   Streptococcus anginosis - MIC*    PENICILLIN <=0.06 SENSITIVE Sensitive     CEFTRIAXONE 0.5 SENSITIVE Sensitive     ERYTHROMYCIN <=0.12 SENSITIVE Sensitive     LEVOFLOXACIN 0.5 SENSITIVE Sensitive     VANCOMYCIN 0.5 SENSITIVE Sensitive     * STREPTOCOCCUS ANGINOSIS  Blood Culture ID Panel (Reflexed)     Status: Abnormal   Collection Time: 05/23/19  2:48 AM  Result Value Ref Range Status   Enterococcus species NOT DETECTED NOT DETECTED Final   Listeria monocytogenes NOT DETECTED NOT DETECTED Final   Staphylococcus species NOT DETECTED NOT DETECTED Final   Staphylococcus aureus (BCID) NOT DETECTED NOT DETECTED Final   Streptococcus species DETECTED (A) NOT DETECTED Final    Comment: Not Enterococcus species, Streptococcus agalactiae, Streptococcus pyogenes, or Streptococcus pneumoniae. CRITICAL RESULT CALLED TO, READ BACK BY AND VERIFIED WITH: Hughie Closs [facility] 05/23/19 2006 JDW    Streptococcus agalactiae NOT DETECTED NOT DETECTED Final   Streptococcus pneumoniae NOT DETECTED NOT DETECTED Final   Streptococcus pyogenes NOT DETECTED NOT DETECTED Final   Acinetobacter baumannii NOT DETECTED NOT DETECTED Final   Enterobacteriaceae species DETECTED (A) NOT DETECTED Final    Comment: Enterobacteriaceae represent a large family of gram-negative bacteria, not a single organism. CRITICAL RESULT  CALLED TO,  READ BACK BY AND VERIFIED WITH: Hughie Closs [facility] 05/23/19 2006 JDW    Enterobacter cloacae complex NOT DETECTED NOT DETECTED Final   Escherichia coli NOT DETECTED NOT DETECTED Final   Klebsiella oxytoca DETECTED (A) NOT DETECTED Final    Comment: CRITICAL RESULT CALLED TO, READ BACK BY AND VERIFIED WITH: Hughie Closs [facility] 05/23/19/2006 JDW    Klebsiella pneumoniae NOT DETECTED NOT DETECTED Final   Proteus species NOT DETECTED NOT DETECTED Final   Serratia marcescens NOT DETECTED NOT DETECTED Final   Carbapenem resistance NOT DETECTED NOT DETECTED Final   Haemophilus influenzae NOT DETECTED NOT DETECTED Final   Neisseria meningitidis NOT DETECTED NOT DETECTED Final   Pseudomonas aeruginosa NOT DETECTED NOT DETECTED Final   Candida albicans NOT DETECTED NOT DETECTED Final   Candida glabrata NOT DETECTED NOT DETECTED Final   Candida krusei NOT DETECTED NOT DETECTED Final   Candida parapsilosis NOT DETECTED NOT DETECTED Final   Candida tropicalis NOT DETECTED NOT DETECTED Final    Comment: Performed at [facility], [place]. [address], [place], [place] [address]  Urine culture     Status: None   Collection Time: 05/23/19  2:53 AM   Specimen: In/Out Cath Urine  Result Value Ref Range Status   Specimen Description IN/OUT CATH URINE  Final   Special Requests NONE  Final   Culture   Final    NO GROWTH Performed at [facility], [place] [address], [place], [place] [address]    Report Status 05/24/2019 FINAL  Final  Respiratory Panel by RT PCR (Flu A&B, Covid) - Nasopharyngeal Swab     Status: None   Collection Time: 05/23/19  3:19 AM   Specimen: Nasopharyngeal Swab  Result Value Ref Range Status   SARS Coronavirus 2 by RT PCR NEGATIVE NEGATIVE Final    Comment: (NOTE) SARS-CoV-2 target nucleic acids are NOT DETECTED. The SARS-CoV-2 RNA is generally detectable in upper respiratoy specimens during the acute phase of infection. The lowest concentration of SARS-CoV-2 viral copies this  assay can detect is 131 copies/mL. A negative result does not preclude SARS-Cov-2 infection and should not be used as the sole basis for treatment or other patient management decisions. A negative result may occur with  improper specimen collection/handling, submission of specimen other than nasopharyngeal swab, presence of viral mutation(s) within the areas targeted by this assay, and inadequate number of viral copies (<131 copies/mL). A negative result must be combined with clinical observations, patient history, and epidemiological information. The expected result is Negative. Fact Sheet for Patients:  PinkCheek.be Fact Sheet for Healthcare Providers:  GravelBags.it This test is not yet ap proved or cleared by the [place] FDA and  has been authorized for detection and/or diagnosis of SARS-CoV-2 by FDA under an Emergency Use Authorization (EUA). This EUA will remain  in effect (meaning this test can be used) for the duration of the COVID-19 declaration under Section 564(b)(1) of the Act, 21 U.S.C. section 360bbb-3(b)(1), unless the authorization is terminated or revoked sooner.    Influenza A by PCR NEGATIVE NEGATIVE Final   Influenza B by PCR NEGATIVE NEGATIVE Final    Comment: (NOTE) The Xpert Xpress SARS-CoV-2/FLU/RSV assay is intended as an aid in  the diagnosis of influenza from Nasopharyngeal swab specimens and  should not be used as a sole basis for treatment. Nasal washings and  aspirates are unacceptable for Xpert Xpress SARS-CoV-2/FLU/RSV  testing. Fact Sheet for Patients: PinkCheek.be Fact Sheet for Healthcare Providers:  GravelBags.it This test is not yet approved or cleared by the [place] and  has been authorized for detection and/or diagnosis of SARS-CoV-2 by  FDA under an Emergency Use Authorization (EUA). This EUA will remain  in  effect (meaning this test can be used) for the duration of the  Covid-19 declaration under Section 564(b)(1) of the Act, 21  U.S.C. section 360bbb-3(b)(1), unless the authorization is  terminated or revoked. Performed at [facility], [place] [address] [place], [place] [address]   Body fluid culture     Status: None   Collection Time: 05/23/19  5:12 PM   Specimen: Gallbladder; Body Fluid  Result Value Ref Range Status   Specimen Description GALL BLADDER  Final   Special Requests BILE  Final   Gram Stain   Final    ABUNDANT WBC PRESENT, PREDOMINANTLY PMN ABUNDANT GRAM POSITIVE COCCI ABUNDANT GRAM NEGATIVE RODS ABUNDANT GRAM POSITIVE RODS Performed at [facility], [place] [address] [place], [place] [address]    Culture   Final    ABUNDANT KLEBSIELLA OXYTOCA ABUNDANT STREPTOCOCCUS ANGINOSIS    Report Status 05/26/2019 FINAL  Final   Organism ID, Bacteria KLEBSIELLA OXYTOCA  Final      Susceptibility   Klebsiella oxytoca - MIC*    AMPICILLIN >=32 RESISTANT Resistant     CEFAZOLIN <=4 SENSITIVE Sensitive     CEFEPIME <=1 SENSITIVE Sensitive     CEFTAZIDIME <=1 SENSITIVE Sensitive     CEFTRIAXONE <=1 SENSITIVE Sensitive     CIPROFLOXACIN <=0.25 SENSITIVE Sensitive     GENTAMICIN <=1 SENSITIVE Sensitive     IMIPENEM <=0.25 SENSITIVE Sensitive     TRIMETH/SULFA <=20 SENSITIVE Sensitive     AMPICILLIN/SULBACTAM 4 SENSITIVE Sensitive     PIP/TAZO <=4 SENSITIVE Sensitive     * ABUNDANT KLEBSIELLA OXYTOCA  MRSA PCR Screening     Status: None   Collection Time: 05/24/19 12:12 AM   Specimen: Nasopharyngeal  Result Value Ref Range Status   MRSA by PCR NEGATIVE NEGATIVE Final    Comment:        The GeneXpert MRSA Assay (FDA approved for NASAL specimens only), is one component of a comprehensive MRSA colonization surveillance program. It is not intended to diagnose MRSA infection nor to guide or monitor treatment for MRSA infections. Performed at [facility], [place] [address], [place], [place] [address]        Today   Subjective:   Gabriella Butler today has no headache,no chest or  abdominal pain,no new weakness tingling or numbness, feels much better  today. She had a  Good BM yesterday.  Objective:   Blood pressure (!) 150/89, pulse 80, temperature 99.2 F (37.3 C), temperature source Oral, resp. rate 18, height 5\' 5"  (1.651 m), weight 91 kg, SpO2 97 %.   Intake/Output Summary (Last 24 hours) at 05/28/2019 1110 Last data filed at 05/28/2019 0830 Gross per 24 hour  Intake 480 ml  Output 1525 ml  Net -1045 ml    Exam Awake Alert, Oriented x 3, No new F.N deficits, Normal affect Symmetrical Chest wall movement, Good air movement bilaterally, CTAB RRR,No Gallops,Rubs or new Murmurs, No Parasternal Heave +ve B.Sounds, Abd Soft, Non tender,has RUQ drain, No rebound -guarding or rigidity. No Cyanosis, Clubbing or edema.  Data Review   CBC w Diff:  Lab Results  Component Value Date   WBC 10.3 05/28/2019   HGB 11.7 (L) 05/28/2019   HCT 35.9 (L) 05/28/2019  PLT 220 05/28/2019   LYMPHOPCT 8 05/23/2019   MONOPCT 1 05/23/2019   EOSPCT 0 05/23/2019   BASOPCT 1 05/23/2019    CMP:  Lab Results  Component Value Date   NA 138 05/28/2019   K 3.5 05/28/2019   CL 110 05/28/2019   CO2 23 05/28/2019   BUN 7 (L) 05/28/2019   CREATININE 0.79 05/28/2019   PROT 5.8 (L) 05/25/2019   ALBUMIN 2.1 (L) 05/25/2019   BILITOT 0.6 05/25/2019   ALKPHOS 61 05/25/2019   AST 16 05/25/2019   ALT 16 05/25/2019  .   Total Time in preparing paper work, data evaluation and todays exam - 3 minutes  Phillips Climes M.D on 05/28/2019 at 11:10 AM  [facility]   Office  [phone]

## 2019-05-29 ENCOUNTER — Other Ambulatory Visit: Payer: Self-pay | Admitting: Internal Medicine

## 2019-05-29 DIAGNOSIS — K819 Cholecystitis, unspecified: Secondary | ICD-10-CM

## 2019-06-04 DIAGNOSIS — J188 Other pneumonia, unspecified organism: Secondary | ICD-10-CM | POA: Diagnosis not present

## 2019-06-04 DIAGNOSIS — Z434 Encounter for attention to other artificial openings of digestive tract: Secondary | ICD-10-CM | POA: Diagnosis not present

## 2019-06-04 DIAGNOSIS — I10 Essential (primary) hypertension: Secondary | ICD-10-CM | POA: Diagnosis not present

## 2019-06-04 DIAGNOSIS — K810 Acute cholecystitis: Secondary | ICD-10-CM | POA: Diagnosis not present

## 2019-06-04 DIAGNOSIS — E1169 Type 2 diabetes mellitus with other specified complication: Secondary | ICD-10-CM | POA: Diagnosis not present

## 2019-06-04 DIAGNOSIS — A4189 Other specified sepsis: Secondary | ICD-10-CM | POA: Diagnosis not present

## 2019-06-04 DIAGNOSIS — N178 Other acute kidney failure: Secondary | ICD-10-CM | POA: Diagnosis not present

## 2019-06-12 ENCOUNTER — Ambulatory Visit (INDEPENDENT_AMBULATORY_CARE_PROVIDER_SITE_OTHER): Payer: Medicare Other | Admitting: Internal Medicine

## 2019-06-12 ENCOUNTER — Telehealth: Payer: Self-pay | Admitting: *Deleted

## 2019-06-12 ENCOUNTER — Encounter: Payer: Self-pay | Admitting: Internal Medicine

## 2019-06-12 VITALS — BP 122/73 | HR 81

## 2019-06-12 DIAGNOSIS — J984 Other disorders of lung: Secondary | ICD-10-CM | POA: Diagnosis not present

## 2019-06-12 DIAGNOSIS — K8020 Calculus of gallbladder without cholecystitis without obstruction: Secondary | ICD-10-CM | POA: Diagnosis not present

## 2019-06-12 DIAGNOSIS — A419 Sepsis, unspecified organism: Secondary | ICD-10-CM

## 2019-06-12 DIAGNOSIS — A4189 Other specified sepsis: Secondary | ICD-10-CM | POA: Diagnosis not present

## 2019-06-12 DIAGNOSIS — N178 Other acute kidney failure: Secondary | ICD-10-CM | POA: Diagnosis not present

## 2019-06-12 DIAGNOSIS — K810 Acute cholecystitis: Secondary | ICD-10-CM | POA: Diagnosis not present

## 2019-06-12 DIAGNOSIS — K819 Cholecystitis, unspecified: Secondary | ICD-10-CM | POA: Diagnosis not present

## 2019-06-12 DIAGNOSIS — J188 Other pneumonia, unspecified organism: Secondary | ICD-10-CM | POA: Diagnosis not present

## 2019-06-12 DIAGNOSIS — E1169 Type 2 diabetes mellitus with other specified complication: Secondary | ICD-10-CM | POA: Diagnosis not present

## 2019-06-12 NOTE — Progress Notes (Signed)
Patient ID: Gabriella Butler, female   DOB: [birth year], 74 y.o.   MRN: [id number]  HPI  74yo F recently hospitalized for acalculous cholecystitis s/p GB drain, with culture growing kleb oxytoca and strep anginosis on 4/22. She was treated with ceftriaxone through 5/4. Still has midline in place.  She received total of 12 days of abtx. She had her drain placed on 4/22 since too high risk for surgery, with plans to bridge to surgery. She reports doing well. Continues to drain bile in her GB drain.   ABUNDANT KLEBSIELLA OXYTOCA  ABUNDANT STREPTOCOCCUS ANGINOSIS   Report Status 05/26/2019 FINAL   Organism ID, Bacteria KLEBSIELLA OXYTOCA   Resulting Agency [facility]  Susceptibility   Klebsiella oxytoca    MIC    AMPICILLIN >=32 RESIST... Resistant    AMPICILLIN/SULBACTAM 4 SENSITIVE  Sensitive    CEFAZOLIN <=4 SENSITIVE  Sensitive    CEFEPIME <=1 SENSITIVE  Sensitive    CEFTAZIDIME <=1 SENSITIVE  Sensitive    CEFTRIAXONE <=1 SENSITIVE  Sensitive    CIPROFLOXACIN <=0.25 SENS... Sensitive    GENTAMICIN <=1 SENSITIVE  Sensitive    IMIPENEM <=0.25 SENS... Sensitive    PIP/TAZO <=4 SENSITIVE  Sensitive    TRIMETH/SULFA <=20 SENSIT... Sensitive         Susceptibility Comments  Klebsiella oxytoca       Outpatient Encounter Medications as of 06/12/2019  Medication Sig  . acetaminophen (TYLENOL) 325 MG tablet Take 2 tablets (650 mg total) by mouth every 6 (six) hours as needed for mild pain (or Fever >/= 101).  Marland Kitchen albuterol (VENTOLIN HFA) 108 (90 Base) MCG/ACT inhaler Inhale 1-2 puffs into the lungs every 4 (four) hours as needed for wheezing or shortness of breath. (Patient not taking: Reported on 05/23/2019)  . beclomethasone (QVAR) 40 MCG/ACT inhaler Inhale 1 puff into the lungs 2 (two) times daily. (Patient not taking: Reported on 04/22/2019)  . ondansetron (ZOFRAN) 4 MG tablet Take 1 tablet (4 mg total) by mouth every 6 (six) hours as needed for nausea. (Patient not taking: Reported  on 04/22/2019)  . polyethylene glycol (MIRALAX / GLYCOLAX) 17 g packet Take 17 g by mouth daily. Please hold for diarrhea  . potassium chloride (KLOR-CON) 10 MEQ tablet Take 1 tablet (10 mEq total) by mouth daily.  Marland Kitchen senna-docusate (SENOKOT-S) 8.6-50 MG tablet Take 2 tablets by mouth 2 (two) times daily.   No facility-administered encounter medications on file as of 06/12/2019.     Patient Active Problem List   Diagnosis Date Noted  . Sepsis ([place]) 05/23/2019  . Hypomagnesemia 05/23/2019  . Hypokalemia 05/23/2019  . Obesity (BMI 30-39.9) 05/23/2019  . History of prediabetes 05/23/2019  . Right lower lobe pneumonia 05/23/2019  . Elevated glucose level   . Acute gallstone pancreatitis 12/06/2017  . Cholelithiasis with acute cholecystitis 12/06/2017  . Essential hypertension 12/06/2017  . Asthma 12/06/2017  . Abnormal magnetic resonance cholangiopancreatography (MRCP)   . Choledocholithiasis      Health Maintenance Due  Topic Date Due  . Hepatitis C Screening  Never done  . COVID-19 Vaccine (1) Never done  . TETANUS/TDAP  Never done  . COLONOSCOPY  Never done  . MAMMOGRAM  07/26/2010  . PNA vac Low Risk Adult (1 of 2 - PCV13) Never done     Review of Systems 12 point ros is negative except what is mentioned above Physical Exam  BP 122/73   Pulse 81  Physical Exam  Constitutional:  oriented to person, place,  and time. appears well-developed and well-nourished. No distress.  HENT: [place]/AT, PERRLA, no scleral icterus Mouth/Throat: Oropharynx is clear and moist. No oropharyngeal exudate.  Cardiovascular: Normal rate, regular rhythm and normal heart sounds. Exam reveals no gallop and no friction rub.  No murmur heard.  Pulmonary/Chest: Effort normal and breath sounds normal. No respiratory distress.  has no wheezes.  Neck = supple, no nuchal rigidity Abdominal: Soft. Bowel sounds are normal.  exhibits no distension. There is no tenderness. Drain in place Lymphadenopathy: no  cervical adenopathy. No axillary adenopathy Neurological: alert and oriented to person, place, and time.  Skin: Skin is warm and dry. No rash noted. No erythema.  Psychiatric: a normal mood and affect.  behavior is normal.   CBC Lab Results  Component Value Date   WBC 10.3 05/28/2019   RBC 3.96 05/28/2019   HGB 11.7 (L) 05/28/2019   HCT 35.9 (L) 05/28/2019   PLT 220 05/28/2019   MCV 90.7 05/28/2019   MCH 29.5 05/28/2019   MCHC 32.6 05/28/2019   RDW 14.5 05/28/2019   LYMPHSABS 0.8 05/23/2019   MONOABS 0.1 05/23/2019   EOSABS 0.0 05/23/2019    BMET Lab Results  Component Value Date   NA 138 05/28/2019   K 3.5 05/28/2019   CL 110 05/28/2019   CO2 23 05/28/2019   GLUCOSE 89 05/28/2019   BUN 7 (L) 05/28/2019   CREATININE 0.79 05/28/2019   CALCIUM 7.9 (L) 05/28/2019   GFRNONAA >60 05/28/2019   GFRAA >60 05/28/2019      Assessment and Plan  History of acalculous cholecystitis =Check labs Pull midline and will decide to do amox/clav based on labs drawn today

## 2019-06-12 NOTE — Telephone Encounter (Signed)
Per verbal order from Dr Baxter Flattery, Midline Catheter removed from left basilic, tip intact. No sutures present. RN confirmed length per chart. Dressing was clean and dry. Petroleum dressing applied. Pt advised no heavy lifting with this arm, leave dressing for 24 hours and call the office or seek emergent care if dressing becomes soaked with blood or sharp pain presents. Patient verbalized understanding and agreement.  Patient's questions answered to their satisfaction. Patient tolerated procedure well, RN walked patient to check out. Landis Gandy, RN

## 2019-06-13 ENCOUNTER — Other Ambulatory Visit: Payer: Self-pay | Admitting: *Deleted

## 2019-06-13 DIAGNOSIS — Z86018 Personal history of other benign neoplasm: Secondary | ICD-10-CM | POA: Diagnosis not present

## 2019-06-13 DIAGNOSIS — K648 Other hemorrhoids: Secondary | ICD-10-CM | POA: Diagnosis not present

## 2019-06-13 DIAGNOSIS — K5909 Other constipation: Secondary | ICD-10-CM | POA: Diagnosis not present

## 2019-06-13 LAB — BASIC METABOLIC PANEL
BUN/Creatinine Ratio: 11 (calc) (ref 6–22)
BUN: 12 mg/dL (ref 7–25)
CO2: 28 mmol/L (ref 20–32)
Calcium: 11.1 mg/dL — ABNORMAL HIGH (ref 8.6–10.4)
Chloride: 102 mmol/L (ref 98–110)
Creat: 1.13 mg/dL — ABNORMAL HIGH (ref 0.60–0.93)
Glucose, Bld: 109 mg/dL — ABNORMAL HIGH (ref 65–99)
Potassium: 5 mmol/L (ref 3.5–5.3)
Sodium: 136 mmol/L (ref 135–146)

## 2019-06-13 LAB — C-REACTIVE PROTEIN: CRP: 30.2 mg/L — ABNORMAL HIGH (ref <8.0)

## 2019-06-13 LAB — CBC
HCT: 38.5 % (ref 35.0–45.0)
Hemoglobin: 12.5 g/dL (ref 11.7–15.5)
MCH: 29 pg (ref 27.0–33.0)
MCHC: 32.5 g/dL (ref 32.0–36.0)
MCV: 89.3 fL (ref 80.0–100.0)
MPV: 9.4 fL (ref 7.5–12.5)
Platelets: 403 10*3/uL — ABNORMAL HIGH (ref 140–400)
RBC: 4.31 10*6/uL (ref 3.80–5.10)
RDW: 12.4 % (ref 11.0–15.0)
WBC: 7.8 10*3/uL (ref 3.8–10.8)

## 2019-06-13 LAB — SEDIMENTATION RATE: Sed Rate: 127 mm/h — ABNORMAL HIGH (ref 0–30)

## 2019-06-13 NOTE — Patient Outreach (Signed)
Member screened for potential [facility] Care Management needs as a benefit of [place] [employer].  Ms. Devlin is receiving skilled therapy at [facility]. Informed by [facility] RN that Ms. Ragucci will return home with family on Friday, May 14th. She will have [facility].   Spoke with facility SW who confirms daughter Taesha Rochell is primary contact at [phone]. Attempted to reach daughter. No answer. Left HIPAA compliant voicemail message requesting return call.  Noted PCP is not listed in Epic and member is listed as un attributed on Medicare All Payers List. Will need to confirm member's PCP prior to assigning to [facility] Management team.  Will plan outreach again to discuss [facility] services and confirm PCP.   Marthenia Rolling, MSN-Ed, RN,BSN [place] Acute Care Coordinator [phone] [facility]) [phone]  (Toll free office)

## 2019-06-17 ENCOUNTER — Other Ambulatory Visit: Payer: Self-pay | Admitting: *Deleted

## 2019-06-17 DIAGNOSIS — E669 Obesity, unspecified: Secondary | ICD-10-CM | POA: Diagnosis not present

## 2019-06-17 DIAGNOSIS — I10 Essential (primary) hypertension: Secondary | ICD-10-CM | POA: Diagnosis not present

## 2019-06-17 DIAGNOSIS — J440 Chronic obstructive pulmonary disease with acute lower respiratory infection: Secondary | ICD-10-CM | POA: Diagnosis not present

## 2019-06-17 DIAGNOSIS — K8000 Calculus of gallbladder with acute cholecystitis without obstruction: Secondary | ICD-10-CM | POA: Diagnosis not present

## 2019-06-17 DIAGNOSIS — Z9111 Patient's noncompliance with dietary regimen: Secondary | ICD-10-CM | POA: Diagnosis not present

## 2019-06-17 DIAGNOSIS — Z7951 Long term (current) use of inhaled steroids: Secondary | ICD-10-CM | POA: Diagnosis not present

## 2019-06-17 DIAGNOSIS — Z434 Encounter for attention to other artificial openings of digestive tract: Secondary | ICD-10-CM | POA: Diagnosis not present

## 2019-06-17 DIAGNOSIS — E1169 Type 2 diabetes mellitus with other specified complication: Secondary | ICD-10-CM | POA: Diagnosis not present

## 2019-06-17 DIAGNOSIS — J189 Pneumonia, unspecified organism: Secondary | ICD-10-CM | POA: Diagnosis not present

## 2019-06-17 DIAGNOSIS — A419 Sepsis, unspecified organism: Secondary | ICD-10-CM | POA: Diagnosis not present

## 2019-06-17 DIAGNOSIS — M109 Gout, unspecified: Secondary | ICD-10-CM | POA: Diagnosis not present

## 2019-06-17 DIAGNOSIS — Z6834 Body mass index (BMI) 34.0-34.9, adult: Secondary | ICD-10-CM | POA: Diagnosis not present

## 2019-06-17 NOTE — Patient Outreach (Signed)
[facility] Post- Acute Care Coordinator follow up  Verified Ms. Schmeiser transitioned to home from [facility] on 06/14/19.   Telephone call made to Meredeth Mellema (daughter) [phone]. Patient identifiers confirmed. Sharlet Salina (pronouced Mickel Baas) reports member is now home.     Sharlet Salina states member does not have a PCP. Therefore, [facility] Care Management will not follow. Eligible for [facility] services however. Sharlet Salina is agreeable to [facility] follow up.   Sharlet Salina also reports member will have gallbladder surgery soon. States member will have [facility].   Discussed importance of member having a PCP follow up. Will provide [facility] and Wellness contact information and address to schedule appointment.  Sharlet Salina states the only number to contact member is [phone].  Will make referral to Remote Health.   Marthenia Rolling, MSN-Ed, RN,BSN [place] Acute Care Coordinator [phone] [facility]) [phone]  (Toll free office)

## 2019-06-18 DIAGNOSIS — K8000 Calculus of gallbladder with acute cholecystitis without obstruction: Secondary | ICD-10-CM | POA: Diagnosis not present

## 2019-06-18 DIAGNOSIS — J189 Pneumonia, unspecified organism: Secondary | ICD-10-CM | POA: Diagnosis not present

## 2019-06-18 DIAGNOSIS — I10 Essential (primary) hypertension: Secondary | ICD-10-CM | POA: Diagnosis not present

## 2019-06-18 DIAGNOSIS — J440 Chronic obstructive pulmonary disease with acute lower respiratory infection: Secondary | ICD-10-CM | POA: Diagnosis not present

## 2019-06-18 DIAGNOSIS — E1169 Type 2 diabetes mellitus with other specified complication: Secondary | ICD-10-CM | POA: Diagnosis not present

## 2019-06-18 DIAGNOSIS — Z434 Encounter for attention to other artificial openings of digestive tract: Secondary | ICD-10-CM | POA: Diagnosis not present

## 2019-06-19 DIAGNOSIS — E1169 Type 2 diabetes mellitus with other specified complication: Secondary | ICD-10-CM | POA: Diagnosis not present

## 2019-06-19 DIAGNOSIS — K8000 Calculus of gallbladder with acute cholecystitis without obstruction: Secondary | ICD-10-CM | POA: Diagnosis not present

## 2019-06-19 DIAGNOSIS — I10 Essential (primary) hypertension: Secondary | ICD-10-CM | POA: Diagnosis not present

## 2019-06-19 DIAGNOSIS — M1990 Unspecified osteoarthritis, unspecified site: Secondary | ICD-10-CM | POA: Diagnosis not present

## 2019-06-19 DIAGNOSIS — K819 Cholecystitis, unspecified: Secondary | ICD-10-CM | POA: Diagnosis not present

## 2019-06-19 DIAGNOSIS — J189 Pneumonia, unspecified organism: Secondary | ICD-10-CM | POA: Diagnosis not present

## 2019-06-19 DIAGNOSIS — E669 Obesity, unspecified: Secondary | ICD-10-CM | POA: Diagnosis not present

## 2019-06-19 DIAGNOSIS — Z434 Encounter for attention to other artificial openings of digestive tract: Secondary | ICD-10-CM | POA: Diagnosis not present

## 2019-06-19 DIAGNOSIS — J440 Chronic obstructive pulmonary disease with acute lower respiratory infection: Secondary | ICD-10-CM | POA: Diagnosis not present

## 2019-06-20 ENCOUNTER — Other Ambulatory Visit: Payer: Medicare Other

## 2019-06-21 DIAGNOSIS — R7309 Other abnormal glucose: Secondary | ICD-10-CM | POA: Diagnosis not present

## 2019-06-21 DIAGNOSIS — J440 Chronic obstructive pulmonary disease with acute lower respiratory infection: Secondary | ICD-10-CM | POA: Diagnosis not present

## 2019-06-21 DIAGNOSIS — Z434 Encounter for attention to other artificial openings of digestive tract: Secondary | ICD-10-CM | POA: Diagnosis not present

## 2019-06-21 DIAGNOSIS — I10 Essential (primary) hypertension: Secondary | ICD-10-CM | POA: Diagnosis not present

## 2019-06-21 DIAGNOSIS — E876 Hypokalemia: Secondary | ICD-10-CM | POA: Diagnosis not present

## 2019-06-21 DIAGNOSIS — K8000 Calculus of gallbladder with acute cholecystitis without obstruction: Secondary | ICD-10-CM | POA: Diagnosis not present

## 2019-06-21 DIAGNOSIS — J189 Pneumonia, unspecified organism: Secondary | ICD-10-CM | POA: Diagnosis not present

## 2019-06-21 DIAGNOSIS — E1169 Type 2 diabetes mellitus with other specified complication: Secondary | ICD-10-CM | POA: Diagnosis not present

## 2019-06-24 DIAGNOSIS — J189 Pneumonia, unspecified organism: Secondary | ICD-10-CM | POA: Diagnosis not present

## 2019-06-24 DIAGNOSIS — E1169 Type 2 diabetes mellitus with other specified complication: Secondary | ICD-10-CM | POA: Diagnosis not present

## 2019-06-24 DIAGNOSIS — K8000 Calculus of gallbladder with acute cholecystitis without obstruction: Secondary | ICD-10-CM | POA: Diagnosis not present

## 2019-06-24 DIAGNOSIS — J440 Chronic obstructive pulmonary disease with acute lower respiratory infection: Secondary | ICD-10-CM | POA: Diagnosis not present

## 2019-06-24 DIAGNOSIS — Z434 Encounter for attention to other artificial openings of digestive tract: Secondary | ICD-10-CM | POA: Diagnosis not present

## 2019-06-24 DIAGNOSIS — I10 Essential (primary) hypertension: Secondary | ICD-10-CM | POA: Diagnosis not present

## 2019-06-25 ENCOUNTER — Ambulatory Visit
Admission: RE | Admit: 2019-06-25 | Discharge: 2019-06-25 | Disposition: A | Discharge status: Home / Self Care | Payer: Medicare Other | Source: Ambulatory Visit | Attending: Radiology | Admitting: Radiology

## 2019-06-25 ENCOUNTER — Ambulatory Visit
Admission: RE | Admit: 2019-06-25 | Discharge: 2019-06-25 | Disposition: A | Discharge status: Home / Self Care | Payer: Medicare Other | Source: Ambulatory Visit | Attending: Internal Medicine | Admitting: Internal Medicine

## 2019-06-25 ENCOUNTER — Other Ambulatory Visit: Payer: Self-pay | Admitting: Internal Medicine

## 2019-06-25 ENCOUNTER — Encounter: Payer: Self-pay | Admitting: Radiology

## 2019-06-25 DIAGNOSIS — K819 Cholecystitis, unspecified: Secondary | ICD-10-CM

## 2019-06-25 DIAGNOSIS — K8000 Calculus of gallbladder with acute cholecystitis without obstruction: Secondary | ICD-10-CM | POA: Diagnosis not present

## 2019-06-25 HISTORY — PX: IR RADIOLOGIST EVAL & MGMT: IMG5224

## 2019-06-25 NOTE — Progress Notes (Signed)
Chief Complaint: Patient was seen in consultation today for perc chole at the request of Allred,Darrell K  Referring Physician(s): Allred,Darrell K  History of Present Illness: Gabriella Butler is a 74 y.o. female who developed sepsis from cholecystitis. She underwent perc chole on 4/22. She has done pretty well. Completed course of IV abx and her PICC line was removed. She is here for follow up drain injection. She reports continued bilious output and she is not flushing. Has not yet seen surgery in follow up but sounds like she has a visit coming up soon.   Past Medical History:  Diagnosis Date  . Asthma   . Bronchitis   . Gout   . Hypertension     Past Surgical History:  Procedure Laterality Date  . IR PERC CHOLECYSTOSTOMY  05/23/2019  . IR RADIOLOGIST EVAL & MGMT  06/25/2019    Allergies: Patient has no known allergies.  Medications: Prior to Admission medications   Medication Sig Start Date End Date Taking? Authorizing Provider  acetaminophen (TYLENOL) 325 MG tablet Take 2 tablets (650 mg total) by mouth every 6 (six) hours as needed for mild pain (or Fever >/= 101). 05/28/19   Elgergawy, Silver Huguenin, MD  albuterol (VENTOLIN HFA) 108 (90 Base) MCG/ACT inhaler Inhale 1-2 puffs into the lungs every 4 (four) hours as needed for wheezing or shortness of breath. Patient not taking: Reported on 05/23/2019 04/23/19   Sherwood Gambler, MD  beclomethasone (QVAR) 40 MCG/ACT inhaler Inhale 1 puff into the lungs 2 (two) times daily. Patient not taking: Reported on 04/22/2019 02/04/17   Dorie Rank, MD  ondansetron (ZOFRAN) 4 MG tablet Take 1 tablet (4 mg total) by mouth every 6 (six) hours as needed for nausea. Patient not taking: Reported on 04/22/2019 12/07/17   Raiford Noble Latif, DO  polyethylene glycol (MIRALAX / GLYCOLAX) 17 g packet Take 17 g by mouth daily. Please hold for diarrhea 05/28/19   Elgergawy, Silver Huguenin, MD  potassium chloride (KLOR-CON) 10 MEQ tablet Take 1 tablet (10 mEq  total) by mouth daily. 06/11/19   Elgergawy, Silver Huguenin, MD  senna-docusate (SENOKOT-S) 8.6-50 MG tablet Take 2 tablets by mouth 2 (two) times daily. 05/28/19   Elgergawy, Silver Huguenin, MD     Family History  Problem Relation Age of Onset  . Hypertension Father     Social History   Socioeconomic History  . Marital status: Single    Spouse name: Not on file  . Number of children: Not on file  . Years of education: Not on file  . Highest education level: Not on file  Occupational History  . Not on file  Tobacco Use  . Smoking status: Never Smoker  . Smokeless tobacco: Never Used  Substance and Sexual Activity  . Alcohol use: No  . Drug use: No  . Sexual activity: Not on file  Other Topics Concern  . Not on file  Social History Narrative  . Not on file   Social Determinants of Health   Financial Resource Strain:   . Difficulty of Paying Living Expenses:   Food Insecurity:   . Worried About Charity fundraiser in the Last Year:   . Arboriculturist in the Last Year:   Transportation Needs:   . Film/video editor (Medical):   Marland Kitchen Lack of Transportation (Non-Medical):   Physical Activity:   . Days of Exercise per Week:   . Minutes of Exercise per Session:   Stress:   .  Feeling of Stress :   Social Connections:   . Frequency of Communication with Friends and Family:   . Frequency of Social Gatherings with Friends and Family:   . Attends Religious Services:   . Active Member of Clubs or Organizations:   . Attends Archivist Meetings:   Marland Kitchen Marital Status:      Review of Systems: A 12 point ROS discussed and pertinent positives are indicated in the HPI above.  All other systems are negative.  Review of Systems  Vital Signs: BP 134/61   Pulse 72   Temp 98.4 F (36.9 C)   SpO2 99%   Physical Exam Cardiovascular:     Rate and Rhythm: Regular rhythm. Tachycardia present.     Heart sounds: Normal heart sounds.  Pulmonary:     Effort: Pulmonary effort is  normal. No respiratory distress.     Breath sounds: Normal breath sounds.  Abdominal:     General: Abdomen is flat. There is no distension.     Palpations: Abdomen is soft.     Comments: RUQ drain intact, site clean. Output cloudy bilious  Neurological:     General: No focal deficit present.     Mental Status: She is oriented to person, place, and time.  Psychiatric:        Mood and Affect: Mood normal.     Labs:  CBC: Recent Labs    05/25/19 0354 05/27/19 0402 05/28/19 0411 06/12/19 1628  WBC 11.7* 9.3 10.3 7.8  HGB 11.6* 11.7* 11.7* 12.5  HCT 35.6* 36.2 35.9* 38.5  PLT 172 196 220 403*    COAGS: Recent Labs    05/23/19 0253  INR 1.2  APTT 28    BMP: Recent Labs    05/24/19 0759 05/24/19 0759 05/25/19 0354 05/27/19 0402 05/28/19 0411 06/12/19 1628  NA 139   < > 141 139 138 136  K 4.2   < > 3.2* 3.2* 3.5 5.0  CL 112*   < > 114* 109 110 102  CO2 16*   < > 20* 22 23 28   GLUCOSE 114*   < > 100* 89 89 109*  BUN 17   < > 14 9 7* 12  CALCIUM 8.3*   < > 8.3* 8.1* 7.9* 11.1*  CREATININE 1.12*   < > 0.87 0.84 0.79 1.13*  GFRNONAA 49*  --  >60 >60 >60  --   GFRAA 57*  --  >60 >60 >60  --    < > = values in this interval not displayed.    LIVER FUNCTION TESTS: Recent Labs    04/22/19 2157 05/23/19 0253 05/24/19 0759 05/25/19 0354  BILITOT 0.5 1.1 1.5* 0.6  AST 20 25 31 16   ALT 18 18 26 16   ALKPHOS 61 180* 64 61  PROT 8.1 7.3 6.3* 5.8*  ALBUMIN 3.9 3.0* 2.3* 2.1*    TUMOR MARKERS: No results for input(s): AFPTM, CEA, CA199, CHROMGRNA in the last 8760 hours.  Assessment and Plan: Cholecystitis s/p perc chole 4/22 Drain injection today shows gb filling, numerous stones. Ultimate filling of CBD and emptying into duodenum c/w cystic duct patency. Continue drain to gravity, no flushes. Keep follow up with surgeon as planned.  Thank you for this interesting consult.  I greatly enjoyed meeting Gabriella Butler and look forward to participating in their  care.  A copy of this report was sent to the requesting provider on this date.  Electronically Signed: Ascencion Dike 06/25/2019, 2:09 PM  I spent a total of 25 minutes in face to face in clinical consultation, greater than 50% of which was counseling/coordinating care for perc chole drain injection and follow up.

## 2019-06-27 DIAGNOSIS — Z434 Encounter for attention to other artificial openings of digestive tract: Secondary | ICD-10-CM | POA: Diagnosis not present

## 2019-06-27 DIAGNOSIS — K8000 Calculus of gallbladder with acute cholecystitis without obstruction: Secondary | ICD-10-CM | POA: Diagnosis not present

## 2019-06-27 DIAGNOSIS — J189 Pneumonia, unspecified organism: Secondary | ICD-10-CM | POA: Diagnosis not present

## 2019-06-27 DIAGNOSIS — I10 Essential (primary) hypertension: Secondary | ICD-10-CM | POA: Diagnosis not present

## 2019-06-27 DIAGNOSIS — J440 Chronic obstructive pulmonary disease with acute lower respiratory infection: Secondary | ICD-10-CM | POA: Diagnosis not present

## 2019-06-27 DIAGNOSIS — E1169 Type 2 diabetes mellitus with other specified complication: Secondary | ICD-10-CM | POA: Diagnosis not present

## 2019-07-03 DIAGNOSIS — K8000 Calculus of gallbladder with acute cholecystitis without obstruction: Secondary | ICD-10-CM | POA: Diagnosis not present

## 2019-07-03 DIAGNOSIS — E1169 Type 2 diabetes mellitus with other specified complication: Secondary | ICD-10-CM | POA: Diagnosis not present

## 2019-07-03 DIAGNOSIS — Z434 Encounter for attention to other artificial openings of digestive tract: Secondary | ICD-10-CM | POA: Diagnosis not present

## 2019-07-03 DIAGNOSIS — J440 Chronic obstructive pulmonary disease with acute lower respiratory infection: Secondary | ICD-10-CM | POA: Diagnosis not present

## 2019-07-03 DIAGNOSIS — J189 Pneumonia, unspecified organism: Secondary | ICD-10-CM | POA: Diagnosis not present

## 2019-07-03 DIAGNOSIS — I10 Essential (primary) hypertension: Secondary | ICD-10-CM | POA: Diagnosis not present

## 2019-07-05 DIAGNOSIS — J440 Chronic obstructive pulmonary disease with acute lower respiratory infection: Secondary | ICD-10-CM | POA: Diagnosis not present

## 2019-07-05 DIAGNOSIS — K8000 Calculus of gallbladder with acute cholecystitis without obstruction: Secondary | ICD-10-CM | POA: Diagnosis not present

## 2019-07-05 DIAGNOSIS — E1169 Type 2 diabetes mellitus with other specified complication: Secondary | ICD-10-CM | POA: Diagnosis not present

## 2019-07-05 DIAGNOSIS — J189 Pneumonia, unspecified organism: Secondary | ICD-10-CM | POA: Diagnosis not present

## 2019-07-05 DIAGNOSIS — I10 Essential (primary) hypertension: Secondary | ICD-10-CM | POA: Diagnosis not present

## 2019-07-05 DIAGNOSIS — Z434 Encounter for attention to other artificial openings of digestive tract: Secondary | ICD-10-CM | POA: Diagnosis not present

## 2019-07-08 DIAGNOSIS — K8000 Calculus of gallbladder with acute cholecystitis without obstruction: Secondary | ICD-10-CM | POA: Diagnosis not present

## 2019-07-08 DIAGNOSIS — J189 Pneumonia, unspecified organism: Secondary | ICD-10-CM | POA: Diagnosis not present

## 2019-07-08 DIAGNOSIS — E1169 Type 2 diabetes mellitus with other specified complication: Secondary | ICD-10-CM | POA: Diagnosis not present

## 2019-07-08 DIAGNOSIS — J440 Chronic obstructive pulmonary disease with acute lower respiratory infection: Secondary | ICD-10-CM | POA: Diagnosis not present

## 2019-07-08 DIAGNOSIS — Z434 Encounter for attention to other artificial openings of digestive tract: Secondary | ICD-10-CM | POA: Diagnosis not present

## 2019-07-08 DIAGNOSIS — I10 Essential (primary) hypertension: Secondary | ICD-10-CM | POA: Diagnosis not present

## 2019-07-10 DIAGNOSIS — K819 Cholecystitis, unspecified: Secondary | ICD-10-CM | POA: Diagnosis not present

## 2019-07-11 DIAGNOSIS — K8000 Calculus of gallbladder with acute cholecystitis without obstruction: Secondary | ICD-10-CM | POA: Diagnosis not present

## 2019-07-11 DIAGNOSIS — Z434 Encounter for attention to other artificial openings of digestive tract: Secondary | ICD-10-CM | POA: Diagnosis not present

## 2019-07-11 DIAGNOSIS — E1169 Type 2 diabetes mellitus with other specified complication: Secondary | ICD-10-CM | POA: Diagnosis not present

## 2019-07-11 DIAGNOSIS — J189 Pneumonia, unspecified organism: Secondary | ICD-10-CM | POA: Diagnosis not present

## 2019-07-11 DIAGNOSIS — I10 Essential (primary) hypertension: Secondary | ICD-10-CM | POA: Diagnosis not present

## 2019-07-11 DIAGNOSIS — J440 Chronic obstructive pulmonary disease with acute lower respiratory infection: Secondary | ICD-10-CM | POA: Diagnosis not present

## 2019-07-17 DIAGNOSIS — I10 Essential (primary) hypertension: Secondary | ICD-10-CM | POA: Diagnosis not present

## 2019-07-17 DIAGNOSIS — J189 Pneumonia, unspecified organism: Secondary | ICD-10-CM | POA: Diagnosis not present

## 2019-07-17 DIAGNOSIS — Z7951 Long term (current) use of inhaled steroids: Secondary | ICD-10-CM | POA: Diagnosis not present

## 2019-07-17 DIAGNOSIS — Z434 Encounter for attention to other artificial openings of digestive tract: Secondary | ICD-10-CM | POA: Diagnosis not present

## 2019-07-17 DIAGNOSIS — Z9111 Patient's noncompliance with dietary regimen: Secondary | ICD-10-CM | POA: Diagnosis not present

## 2019-07-17 DIAGNOSIS — J440 Chronic obstructive pulmonary disease with acute lower respiratory infection: Secondary | ICD-10-CM | POA: Diagnosis not present

## 2019-07-17 DIAGNOSIS — Z6834 Body mass index (BMI) 34.0-34.9, adult: Secondary | ICD-10-CM | POA: Diagnosis not present

## 2019-07-17 DIAGNOSIS — E669 Obesity, unspecified: Secondary | ICD-10-CM | POA: Diagnosis not present

## 2019-07-17 DIAGNOSIS — K8000 Calculus of gallbladder with acute cholecystitis without obstruction: Secondary | ICD-10-CM | POA: Diagnosis not present

## 2019-07-17 DIAGNOSIS — M109 Gout, unspecified: Secondary | ICD-10-CM | POA: Diagnosis not present

## 2019-07-17 DIAGNOSIS — E1169 Type 2 diabetes mellitus with other specified complication: Secondary | ICD-10-CM | POA: Diagnosis not present

## 2019-07-23 DIAGNOSIS — Z434 Encounter for attention to other artificial openings of digestive tract: Secondary | ICD-10-CM | POA: Diagnosis not present

## 2019-07-23 DIAGNOSIS — E1169 Type 2 diabetes mellitus with other specified complication: Secondary | ICD-10-CM | POA: Diagnosis not present

## 2019-07-23 DIAGNOSIS — J440 Chronic obstructive pulmonary disease with acute lower respiratory infection: Secondary | ICD-10-CM | POA: Diagnosis not present

## 2019-07-23 DIAGNOSIS — J189 Pneumonia, unspecified organism: Secondary | ICD-10-CM | POA: Diagnosis not present

## 2019-07-23 DIAGNOSIS — I10 Essential (primary) hypertension: Secondary | ICD-10-CM | POA: Diagnosis not present

## 2019-07-23 DIAGNOSIS — K8000 Calculus of gallbladder with acute cholecystitis without obstruction: Secondary | ICD-10-CM | POA: Diagnosis not present

## 2019-07-25 ENCOUNTER — Other Ambulatory Visit: Payer: Medicare Other

## 2019-07-25 DIAGNOSIS — J45909 Unspecified asthma, uncomplicated: Secondary | ICD-10-CM | POA: Diagnosis not present

## 2019-07-25 DIAGNOSIS — I10 Essential (primary) hypertension: Secondary | ICD-10-CM | POA: Diagnosis not present

## 2019-07-25 DIAGNOSIS — K811 Chronic cholecystitis: Secondary | ICD-10-CM | POA: Diagnosis not present

## 2019-07-25 DIAGNOSIS — M1990 Unspecified osteoarthritis, unspecified site: Secondary | ICD-10-CM | POA: Diagnosis not present

## 2019-08-06 ENCOUNTER — Other Ambulatory Visit: Payer: Medicare Other

## 2019-08-06 ENCOUNTER — Encounter: Payer: Self-pay | Admitting: *Deleted

## 2019-08-06 ENCOUNTER — Ambulatory Visit
Admission: RE | Admit: 2019-08-06 | Discharge: 2019-08-06 | Disposition: A | Discharge status: Home / Self Care | Payer: Medicare Other | Source: Ambulatory Visit | Attending: Internal Medicine | Admitting: Internal Medicine

## 2019-08-06 ENCOUNTER — Other Ambulatory Visit (HOSPITAL_COMMUNITY): Payer: Self-pay | Admitting: Internal Medicine

## 2019-08-06 DIAGNOSIS — K819 Cholecystitis, unspecified: Secondary | ICD-10-CM

## 2019-08-06 DIAGNOSIS — K8060 Calculus of gallbladder and bile duct with cholecystitis, unspecified, without obstruction: Secondary | ICD-10-CM | POA: Diagnosis not present

## 2019-08-06 DIAGNOSIS — Z934 Other artificial openings of gastrointestinal tract status: Secondary | ICD-10-CM | POA: Diagnosis not present

## 2019-08-06 HISTORY — PX: IR RADIOLOGIST EVAL & MGMT: IMG5224

## 2019-08-06 NOTE — Progress Notes (Signed)
Chief Complaint: Patient was seen in consultation today for perc chole follow up    History of Present Illness: Gabriella Butler is a 74 y.o. female who developed sepsis from cholecystitis. She underwent perc chole on 4/22. She has done pretty well. Completed course of IV abx and her PICC line was removed. She was seen by [place] on 5/25 and had chole tube injection at that time showing multiple gallstones and at least 1 CBD stone, however contrast did empty to the duodenum. She has seen surgery in the interim and is scheduled to go back in a few week to discuss surgery again. She's otherwise been doing well. Daughter is with her today.   Past Medical History:  Diagnosis Date  . Asthma   . Bronchitis   . Gout   . Hypertension     Past Surgical History:  Procedure Laterality Date  . IR PERC CHOLECYSTOSTOMY  05/23/2019  . IR RADIOLOGIST EVAL & MGMT  06/25/2019  . IR RADIOLOGIST EVAL & MGMT  08/06/2019    Allergies: Patient has no known allergies.  Medications: Prior to Admission medications   Medication Sig Start Date End Date Taking? Authorizing Provider  acetaminophen (TYLENOL) 325 MG tablet Take 2 tablets (650 mg total) by mouth every 6 (six) hours as needed for mild pain (or Fever >/= 101). 05/28/19   Elgergawy, Silver Huguenin, MD  albuterol (VENTOLIN HFA) 108 (90 Base) MCG/ACT inhaler Inhale 1-2 puffs into the lungs every 4 (four) hours as needed for wheezing or shortness of breath. Patient not taking: Reported on 05/23/2019 04/23/19   Sherwood Gambler, MD  beclomethasone (QVAR) 40 MCG/ACT inhaler Inhale 1 puff into the lungs 2 (two) times daily. Patient not taking: Reported on 04/22/2019 02/04/17   Dorie Rank, MD  ondansetron (ZOFRAN) 4 MG tablet Take 1 tablet (4 mg total) by mouth every 6 (six) hours as needed for nausea. Patient not taking: Reported on 04/22/2019 12/07/17   Raiford Noble Latif, DO  polyethylene glycol (MIRALAX / GLYCOLAX) 17 g packet Take 17 g by mouth daily. Please hold  for diarrhea 05/28/19   Elgergawy, Silver Huguenin, MD  potassium chloride (KLOR-CON) 10 MEQ tablet Take 1 tablet (10 mEq total) by mouth daily. 06/11/19   Elgergawy, Silver Huguenin, MD  senna-docusate (SENOKOT-S) 8.6-50 MG tablet Take 2 tablets by mouth 2 (two) times daily. 05/28/19   Elgergawy, Silver Huguenin, MD     Family History  Problem Relation Age of Onset  . Hypertension Father     Social History   Socioeconomic History  . Marital status: Single    Spouse name: Not on file  . Number of children: Not on file  . Years of education: Not on file  . Highest education level: Not on file  Occupational History  . Not on file  Tobacco Use  . Smoking status: Never Smoker  . Smokeless tobacco: Never Used  Vaping Use  . Vaping Use: Never used  Substance and Sexual Activity  . Alcohol use: No  . Drug use: No  . Sexual activity: Not on file  Other Topics Concern  . Not on file  Social History Narrative  . Not on file   Social Determinants of Health   Financial Resource Strain:   . Difficulty of Paying Living Expenses:   Food Insecurity:   . Worried About Charity fundraiser in the Last Year:   . Arboriculturist in the Last Year:   Transportation Needs:   .  Lack of Transportation (Medical):   Marland Kitchen Lack of Transportation (Non-Medical):   Physical Activity:   . Days of Exercise per Week:   . Minutes of Exercise per Session:   Stress:   . Feeling of Stress :   Social Connections:   . Frequency of Communication with Friends and Family:   . Frequency of Social Gatherings with Friends and Family:   . Attends Religious Services:   . Active Member of Clubs or Organizations:   . Attends Archivist Meetings:   Marland Kitchen Marital Status:      Review of Systems: A 12 point ROS discussed and pertinent positives are indicated in the HPI above.  All other systems are negative.  Review of Systems  Vital Signs: There were no vitals taken for this visit.  Physical Exam Cardiovascular:     Rate  and Rhythm: Normal rate and regular rhythm.     Heart sounds: Normal heart sounds.  Pulmonary:     Effort: Pulmonary effort is normal. No respiratory distress.     Breath sounds: Normal breath sounds.  Abdominal:     General: Abdomen is flat. There is no distension.     Palpations: Abdomen is soft.     Comments: RUQ drain intact, site clean. Output thin bilious  Neurological:     General: No focal deficit present.     Mental Status: She is oriented to person, place, and time.  Psychiatric:        Mood and Affect: Mood normal.     Labs:  CBC: Recent Labs    05/25/19 0354 05/27/19 0402 05/28/19 0411 06/12/19 1628  WBC 11.7* 9.3 10.3 7.8  HGB 11.6* 11.7* 11.7* 12.5  HCT 35.6* 36.2 35.9* 38.5  PLT 172 196 220 403*    COAGS: Recent Labs    05/23/19 0253  INR 1.2  APTT 28    BMP: Recent Labs    05/24/19 0759 05/24/19 0759 05/25/19 0354 05/27/19 0402 05/28/19 0411 06/12/19 1628  NA 139   < > 141 139 138 136  K 4.2   < > 3.2* 3.2* 3.5 5.0  CL 112*   < > 114* 109 110 102  CO2 16*   < > 20* 22 23 28   GLUCOSE 114*   < > 100* 89 89 109*  BUN 17   < > 14 9 7* 12  CALCIUM 8.3*   < > 8.3* 8.1* 7.9* 11.1*  CREATININE 1.12*   < > 0.87 0.84 0.79 1.13*  GFRNONAA 49*  --  >60 >60 >60  --   GFRAA 57*  --  >60 >60 >60  --    < > = values in this interval not displayed.    LIVER FUNCTION TESTS: Recent Labs    04/22/19 2157 05/23/19 0253 05/24/19 0759 05/25/19 0354  BILITOT 0.5 1.1 1.5* 0.6  AST 20 25 31 16   ALT 18 18 26 16   ALKPHOS 61 180* 64 61  PROT 8.1 7.3 6.3* 5.8*  ALBUMIN 3.9 3.0* 2.3* 2.1*    TUMOR MARKERS: No results for input(s): AFPTM, CEA, CA199, CHROMGRNA in the last 8760 hours.  Assessment and Plan: Cholecystitis s/p perc chole 4/22 Drain injection 5/25 showed gb filling, numerous stones. Ultimate filling of CBD and emptying into duodenum c/w cystic duct patency. She is about 9 weeks since placement and therefore needs to be scheduled for  perc chole drain exchange. We will arrange for this next week. Then she can further follow up with  her surgical team.  Thank you for this interesting consult.  I greatly enjoyed meeting Gabriella Butler and look forward to participating in their care.  A copy of this report was sent to the requesting provider on this date.  Electronically Signed: Ascencion Dike 08/06/2019, 3:03 PM   I spent a total of 25 minutes in face to face in clinical consultation, greater than 50% of which was counseling/coordinating care for perc chole drain follow up.

## 2019-08-12 ENCOUNTER — Ambulatory Visit (HOSPITAL_COMMUNITY): Payer: Medicare Other

## 2019-08-14 ENCOUNTER — Other Ambulatory Visit: Payer: Self-pay

## 2019-08-14 ENCOUNTER — Ambulatory Visit (HOSPITAL_COMMUNITY)
Admission: RE | Admit: 2019-08-14 | Discharge: 2019-08-14 | Disposition: A | Discharge status: Home / Self Care | Payer: Medicare Other | Source: Ambulatory Visit | Attending: Internal Medicine | Admitting: Internal Medicine

## 2019-08-14 ENCOUNTER — Other Ambulatory Visit (HOSPITAL_COMMUNITY): Payer: Self-pay | Admitting: Internal Medicine

## 2019-08-14 DIAGNOSIS — Z4803 Encounter for change or removal of drains: Secondary | ICD-10-CM | POA: Insufficient documentation

## 2019-08-14 DIAGNOSIS — K819 Cholecystitis, unspecified: Secondary | ICD-10-CM | POA: Diagnosis not present

## 2019-08-14 DIAGNOSIS — K8000 Calculus of gallbladder with acute cholecystitis without obstruction: Secondary | ICD-10-CM | POA: Diagnosis not present

## 2019-08-14 HISTORY — PX: IR EXCHANGE BILIARY DRAIN: IMG6046

## 2019-08-14 MED ORDER — LIDOCAINE HCL 1 % IJ SOLN
INTRAMUSCULAR | Status: AC
  Filled 2019-08-14: qty 20

## 2019-08-14 MED ORDER — LIDOCAINE HCL (PF) 1 % IJ SOLN
INTRAMUSCULAR | Status: AC | PRN
  Administered 2019-08-14 (×2): 10 mL

## 2019-08-14 MED ORDER — IOHEXOL 300 MG/ML  SOLN
50.0000 mL | Freq: Once | INTRAMUSCULAR | Status: AC | PRN
  Administered 2019-08-14: 10 mL

## 2019-08-14 NOTE — Procedures (Signed)
Interventional Radiology Procedure:   Indications: Cholecystostomy tube  Procedure: Drain exchange  Findings: Multiple stones in gallbladder.  Dilated CBD with small stones.  Contrast drains into duodenum.    Complications: None     EBL: None  Plan: Schedule routine exchange in 12 weeks.    Frazer [place] R. Anselm Pancoast, MD  Pager: [phone]

## 2019-10-01 DIAGNOSIS — K819 Cholecystitis, unspecified: Secondary | ICD-10-CM | POA: Diagnosis not present

## 2019-10-19 DIAGNOSIS — I10 Essential (primary) hypertension: Secondary | ICD-10-CM | POA: Diagnosis not present

## 2019-10-19 DIAGNOSIS — K811 Chronic cholecystitis: Secondary | ICD-10-CM | POA: Diagnosis not present

## 2019-10-19 DIAGNOSIS — J45909 Unspecified asthma, uncomplicated: Secondary | ICD-10-CM | POA: Diagnosis not present

## 2019-10-19 DIAGNOSIS — R7301 Impaired fasting glucose: Secondary | ICD-10-CM | POA: Diagnosis not present

## 2019-10-23 ENCOUNTER — Other Ambulatory Visit: Payer: Self-pay

## 2019-10-23 ENCOUNTER — Other Ambulatory Visit (HOSPITAL_COMMUNITY): Payer: Self-pay | Admitting: Internal Medicine

## 2019-10-23 ENCOUNTER — Ambulatory Visit (HOSPITAL_COMMUNITY)
Admission: RE | Admit: 2019-10-23 | Discharge: 2019-10-23 | Disposition: A | Discharge status: Home / Self Care | Payer: Medicare Other | Source: Ambulatory Visit | Attending: Internal Medicine | Admitting: Internal Medicine

## 2019-10-23 ENCOUNTER — Encounter: Payer: Self-pay | Admitting: Physician Assistant

## 2019-10-23 DIAGNOSIS — K819 Cholecystitis, unspecified: Secondary | ICD-10-CM

## 2019-10-23 DIAGNOSIS — T859XXA Unspecified complication of internal prosthetic device, implant and graft, initial encounter: Secondary | ICD-10-CM | POA: Insufficient documentation

## 2019-10-23 DIAGNOSIS — K810 Acute cholecystitis: Secondary | ICD-10-CM | POA: Diagnosis not present

## 2019-10-23 DIAGNOSIS — T85628A Displacement of other specified internal prosthetic devices, implants and grafts, initial encounter: Secondary | ICD-10-CM | POA: Diagnosis not present

## 2019-10-23 DIAGNOSIS — X58XXXA Exposure to other specified factors, initial encounter: Secondary | ICD-10-CM | POA: Diagnosis not present

## 2019-10-23 HISTORY — PX: IR PERC CHOLECYSTOSTOMY: IMG2326

## 2019-10-23 MED ORDER — LIDOCAINE HCL 1 % IJ SOLN
INTRAMUSCULAR | Status: DC | PRN
  Administered 2019-10-23: 10 mL

## 2019-10-23 MED ORDER — ONDANSETRON HCL 4 MG/2ML IJ SOLN
4.0000 mg | Freq: Once | INTRAMUSCULAR | Status: AC
  Administered 2019-10-23: 4 mg via INTRAVENOUS

## 2019-10-23 MED ORDER — FENTANYL CITRATE (PF) 100 MCG/2ML IJ SOLN
INTRAMUSCULAR | Status: AC
  Filled 2019-10-23: qty 2

## 2019-10-23 MED ORDER — OXYCODONE-ACETAMINOPHEN 5-325 MG PO TABS
1.0000 | ORAL_TABLET | Freq: Once | ORAL | Status: AC
  Administered 2019-10-23: 1 via ORAL

## 2019-10-23 MED ORDER — FENTANYL CITRATE (PF) 100 MCG/2ML IJ SOLN
50.0000 ug | Freq: Once | INTRAMUSCULAR | Status: AC
  Administered 2019-10-23: 50 ug via INTRAVENOUS

## 2019-10-23 MED ORDER — ONDANSETRON HCL 4 MG/2ML IJ SOLN
INTRAMUSCULAR | Status: AC
  Filled 2019-10-23: qty 2

## 2019-10-23 MED ORDER — LIDOCAINE HCL 1 % IJ SOLN
INTRAMUSCULAR | Status: AC
  Filled 2019-10-23: qty 20

## 2019-10-23 MED ORDER — IOHEXOL 300 MG/ML  SOLN
50.0000 mL | Freq: Once | INTRAMUSCULAR | Status: AC | PRN
  Administered 2019-10-23: 15 mL

## 2019-10-23 MED ORDER — OXYCODONE-ACETAMINOPHEN 5-325 MG PO TABS
ORAL_TABLET | ORAL | Status: AC
  Filled 2019-10-23: qty 1

## 2019-10-23 NOTE — Progress Notes (Signed)
IR.  Patient with history of acute calculus cholecystitis s/p percutaneous cholecystotomy tube in IR 05/23/2019 at the request of Dr. Grandville Silos ([facility]); most recently exchanged in IR 08/14/2019; presented to [facility] today for an image-guided routine cholecystostomy exchange with Dr. Pascal Lux.  Upon evaluation in IR today, patient's cholecystostomy tube was inadvertently removed (not within gallbladder wall). Attempts during procedure to recanalize cholecystostomy tube unsuccessful, therefore new placement of new cholecystostomy tube was preformed. US revealed calculus gallbladder, copious amounts of purulent drainage seen when cholecystostomy tube connected to gravity- concern for possible transition to sepsis.  Spoke with patient's surgical team Barkley Boards, [place]) regarding management options. Claiborne Billings and Dr. Sherlene Shams saw patient bedside and recommend discharge home with PO antibiotics (prescribed by [facility]) along with close surgical follow-up at [facility]. In addition, [facility] recommends OTC Ibuprofen/Tylenol for pain management.  Went to see patient bedside following surgical evaluation to discuss plans. Patient complaining for 8/10 pain at drain insertion site along with nausea. IV Zofran along with PO Percocet 5-325 x1 given to patient with improvement of pain and nausea.  Plan to discharge home today. Surgical follow-up: begin PO antibiotics per [facility] and plan for follow-up with Dr. Sherlene Shams in 1-2 weeks. Recommend OTC Ibuprofen/Tylenol for pain control. IR follow-up: plan for routine cholecystostomy tube exchange in 6 weeks (order placed to facilitate this- IR schedulers to call patient to schedule this procedure). Please call IR with questions/concerns.   Bea Graff Cyruss Arata, PA-C 10/23/2019, 1:24 PM

## 2019-10-23 NOTE — Patient Instructions (Signed)
We will send in an Rx for 7 days oral antibiotics.  We will also arrange OP follow up in [place] office to discuss interval cholecystectomy. Take tylenol PRN for fever at home. Diet as tolerated Continue drain care  Reasons to come to ED: Persistent fevers not improving with tylenol for >72 hrs Unable to tolerate liquids

## 2019-10-23 NOTE — Procedures (Signed)
Pre procedural Dx: Chronic cholecystitis, inadvertent removal of chole tube.  Post procedural Dx: Same  Unsuccessful fluoro guided re-canalization of chole tube track.   Technically successful [place] and Fluoro guided placed of a 10 Fr drainage catheter placement into the gallbladder for chronic cholecystitis. Chole tube connected to gravity bag.   EBL: None Complications: None immediate  Ronny Bacon, MD Pager #: [phone]

## 2019-10-23 NOTE — Progress Notes (Signed)
Pt more comfort now.  IV removed and patient and daughter escorted out.  Daughter asked about getting medications filled.  Encouraged her to go get RX on the way home

## 2019-10-24 ENCOUNTER — Telehealth (HOSPITAL_COMMUNITY): Payer: Self-pay

## 2019-10-24 NOTE — Telephone Encounter (Signed)
Called to schedule drain exchange for the week of 11/1, no answer, left vm. AW

## 2019-10-30 ENCOUNTER — Other Ambulatory Visit: Payer: Self-pay | Admitting: Student

## 2019-10-30 ENCOUNTER — Other Ambulatory Visit: Payer: Self-pay

## 2019-10-30 ENCOUNTER — Ambulatory Visit (HOSPITAL_COMMUNITY)
Admission: RE | Admit: 2019-10-30 | Discharge: 2019-10-30 | Disposition: A | Discharge status: Home / Self Care | Payer: Medicare Other | Source: Ambulatory Visit | Attending: Student | Admitting: Student

## 2019-10-30 DIAGNOSIS — K8020 Calculus of gallbladder without cholecystitis without obstruction: Secondary | ICD-10-CM | POA: Diagnosis not present

## 2019-10-30 DIAGNOSIS — I10 Essential (primary) hypertension: Secondary | ICD-10-CM | POA: Diagnosis not present

## 2019-10-30 DIAGNOSIS — T85638A Leakage of other specified internal prosthetic devices, implants and grafts, initial encounter: Secondary | ICD-10-CM | POA: Diagnosis not present

## 2019-10-30 DIAGNOSIS — M109 Gout, unspecified: Secondary | ICD-10-CM | POA: Insufficient documentation

## 2019-10-30 DIAGNOSIS — K851 Biliary acute pancreatitis without necrosis or infection: Secondary | ICD-10-CM

## 2019-10-30 DIAGNOSIS — Z79899 Other long term (current) drug therapy: Secondary | ICD-10-CM | POA: Diagnosis not present

## 2019-10-30 DIAGNOSIS — K819 Cholecystitis, unspecified: Secondary | ICD-10-CM

## 2019-10-30 DIAGNOSIS — K8001 Calculus of gallbladder with acute cholecystitis with obstruction: Secondary | ICD-10-CM | POA: Insufficient documentation

## 2019-10-30 DIAGNOSIS — D252 Subserosal leiomyoma of uterus: Secondary | ICD-10-CM | POA: Diagnosis not present

## 2019-10-30 DIAGNOSIS — K8000 Calculus of gallbladder with acute cholecystitis without obstruction: Secondary | ICD-10-CM

## 2019-10-30 DIAGNOSIS — J45909 Unspecified asthma, uncomplicated: Secondary | ICD-10-CM | POA: Diagnosis not present

## 2019-10-30 DIAGNOSIS — Z4803 Encounter for change or removal of drains: Secondary | ICD-10-CM | POA: Diagnosis not present

## 2019-10-30 HISTORY — PX: IR EXCHANGE BILIARY DRAIN: IMG6046

## 2019-10-30 MED ORDER — IOHEXOL 300 MG/ML  SOLN
50.0000 mL | Freq: Once | INTRAMUSCULAR | Status: AC | PRN
  Administered 2019-10-30: 12 mL

## 2019-10-30 MED ORDER — LIDOCAINE HCL (PF) 1 % IJ SOLN
INTRAMUSCULAR | Status: DC | PRN
  Administered 2019-10-30: 10 mL

## 2019-10-30 MED ORDER — LIDOCAINE HCL 1 % IJ SOLN
INTRAMUSCULAR | Status: AC
  Filled 2019-10-30: qty 20

## 2019-10-30 NOTE — Progress Notes (Signed)
Referring Physician(s): Dr. Grandville Silos  Chief Complaint: The patient is seen in follow up today s/p percutaneous cholecystostomy tube placement 05/23/19 for acute cholecystitis   History of present illness: Gabriella Butler is a 74 year old female with history of asthma, HTN who presented to [facility] ED 05/23/19 with malaise and abdominal pain.  She was found to have sepsis, acute renal failure due to acute calculous cholecystitis.  She is now s/p percutaneous cholecystostomy tube placement 05/23/19 by Dr. Laurence Ferrari. She has presented for routine follow-up in IR drain clinic and has consistently been found to have persistent stones with blocked ducts. Her tube has remained in place- either long term or for removal at the time of surgery if deemed an operative candidate.  Her perc chole was most recently exchanged 10/23/19, however she called our office reporting significant leakage and pain around the drain site.  She presented for possible exchange today.  She is currently taking antibiotics.   Past Medical History:  Diagnosis Date  . Asthma   . Bronchitis   . Gout   . Hypertension     Past Surgical History:  Procedure Laterality Date  . IR EXCHANGE BILIARY DRAIN  08/14/2019  . IR PERC CHOLECYSTOSTOMY  05/23/2019  . IR PERC CHOLECYSTOSTOMY  10/23/2019  . IR RADIOLOGIST EVAL & MGMT  06/25/2019  . IR RADIOLOGIST EVAL & MGMT  08/06/2019    Allergies: Patient has no known allergies.  Medications: Prior to Admission medications   Medication Sig Start Date End Date Taking? Authorizing Provider  acetaminophen (TYLENOL) 325 MG tablet Take 2 tablets (650 mg total) by mouth every 6 (six) hours as needed for mild pain (or Fever >/= 101). 05/28/19   Elgergawy, Silver Huguenin, MD  albuterol (VENTOLIN HFA) 108 (90 Base) MCG/ACT inhaler Inhale 1-2 puffs into the lungs every 4 (four) hours as needed for wheezing or shortness of breath. Patient not taking: Reported on 05/23/2019 04/23/19   Sherwood Gambler, MD    beclomethasone (QVAR) 40 MCG/ACT inhaler Inhale 1 puff into the lungs 2 (two) times daily. Patient not taking: Reported on 04/22/2019 02/04/17   Dorie Rank, MD  ondansetron (ZOFRAN) 4 MG tablet Take 1 tablet (4 mg total) by mouth every 6 (six) hours as needed for nausea. Patient not taking: Reported on 04/22/2019 12/07/17   Raiford Noble Latif, DO  polyethylene glycol (MIRALAX / GLYCOLAX) 17 g packet Take 17 g by mouth daily. Please hold for diarrhea 05/28/19   Elgergawy, Silver Huguenin, MD  potassium chloride (KLOR-CON) 10 MEQ tablet Take 1 tablet (10 mEq total) by mouth daily. 06/11/19   Elgergawy, Silver Huguenin, MD  senna-docusate (SENOKOT-S) 8.6-50 MG tablet Take 2 tablets by mouth 2 (two) times daily. 05/28/19   Elgergawy, Silver Huguenin, MD     Family History  Problem Relation Age of Onset  . Hypertension Father     Social History   Socioeconomic History  . Marital status: Single    Spouse name: Not on file  . Number of children: Not on file  . Years of education: Not on file  . Highest education level: Not on file  Occupational History  . Not on file  Tobacco Use  . Smoking status: Never Smoker  . Smokeless tobacco: Never Used  Vaping Use  . Vaping Use: Never used  Substance and Sexual Activity  . Alcohol use: No  . Drug use: No  . Sexual activity: Not on file  Other Topics Concern  . Not on file  Social  History Narrative  . Not on file   Social Determinants of Health   Financial Resource Strain:   . Difficulty of Paying Living Expenses: Not on file  Food Insecurity:   . Worried About Charity fundraiser in the Last Year: Not on file  . Ran Out of Food in the Last Year: Not on file  Transportation Needs:   . Lack of Transportation (Medical): Not on file  . Lack of Transportation (Non-Medical): Not on file  Physical Activity:   . Days of Exercise per Week: Not on file  . Minutes of Exercise per Session: Not on file  Stress:   . Feeling of Stress : Not on file  Social Connections:    . Frequency of Communication with Friends and Family: Not on file  . Frequency of Social Gatherings with Friends and Family: Not on file  . Attends Religious Services: Not on file  . Active Member of Clubs or Organizations: Not on file  . Attends Archivist Meetings: Not on file  . Marital Status: Not on file     Vital Signs: There were no vitals taken for this visit.  Physical Exam  NAD, alert Abdomen: RUQ drain in place. Insertion site intact with leakage noted, bilious. Tube possibly retracted.   Imaging: No results found.  Labs:  CBC: Recent Labs    05/25/19 0354 05/27/19 0402 05/28/19 0411 06/12/19 1628  WBC 11.7* 9.3 10.3 7.8  HGB 11.6* 11.7* 11.7* 12.5  HCT 35.6* 36.2 35.9* 38.5  PLT 172 196 220 403*    COAGS: Recent Labs    05/23/19 0253  INR 1.2  APTT 28    BMP: Recent Labs    05/24/19 0759 05/24/19 0759 05/25/19 0354 05/27/19 0402 05/28/19 0411 06/12/19 1628  NA 139   < > 141 139 138 136  K 4.2   < > 3.2* 3.2* 3.5 5.0  CL 112*   < > 114* 109 110 102  CO2 16*   < > 20* 22 23 28   GLUCOSE 114*   < > 100* 89 89 109*  BUN 17   < > 14 9 7* 12  CALCIUM 8.3*   < > 8.3* 8.1* 7.9* 11.1*  CREATININE 1.12*   < > 0.87 0.84 0.79 1.13*  GFRNONAA 49*  --  >60 >60 >60  --   GFRAA 57*  --  >60 >60 >60  --    < > = values in this interval not displayed.    LIVER FUNCTION TESTS: Recent Labs    04/22/19 2157 05/23/19 0253 05/24/19 0759 05/25/19 0354  BILITOT 0.5 1.1 1.5* 0.6  AST 20 25 31 16   ALT 18 18 26 16   ALKPHOS 61 180* 64 61  PROT 8.1 7.3 6.3* 5.8*  ALBUMIN 3.9 3.0* 2.3* 2.1*    Assessment: Calculous cholecystitis s/p percutaneous cholecystostomy tube placement 05/23/19,  Recently exchanged 10/23/19 Abdominal pain, leakage from drain site Patient assessed at bedside by Dr. Serafina Royals.  Drain injection performed and shows possible contrast within the gallbladder as well as peritoneum.  Drain exchanged and upsized with continued noted  contrast potentially outside the gall bladder. CT Abdomen Pelvis obtained and reviewed by Dr. Serafina Royals.  Percutaneous cholecystostomy tube position is confirmed within the lumen of the gall bladder.  Note collection of contrast within the soft tissue potentially through side hole during initial injection.  Patient encouraged to continue antibiotics as prescribed.  Continue with current drain management including flushes daily.  Dr.  Suttle has instructed the patient to call our office if output/leakage from around the drain does not improve in the next 2-3 days.  May need repeat eval.  If patient were to develop nausea, vomiting, fevers, she is instructed to present to the ED.  If symptoms well controlled and site improving in the coming days, plan for routine cholecystostomy tube exchange in 12 weeks.   Signed: Docia Barrier, PA 10/30/2019, 11:46 AM   Please refer to Dr. Serafina Royals attestation of this note for management and plan.

## 2019-10-30 NOTE — Procedures (Signed)
Interventional Radiology Procedure Note  Procedure: Check and exchange of indwelling cholecystostomy tube.  Findings: Please refer to procedural dictation for full description.  Leakage noted from around indwelling drain at skin entry site.  Injection of contrast demonstrates pigtail portion within stone-filled gallbladder, but leakage along the right hemiabdomen.  Drain upsized to 12 Fr, but with persistent leakage after placement.  Complications: None immediate  Estimated Blood Loss: <10 mL  Recommendations: CT abdomen and pelvis for confirmation of drain location. If pigtail portion is within gallbladder lumen, recommend giving several days of drainage to assess leakage.  Hopeful for resolution of this problem with upsized drain.  May require additional upsize to 14 Fr and/or repositioning of pigtail portion.   Ruthann Cancer, MD

## 2019-10-30 NOTE — Progress Notes (Signed)
Interventional Radiology Brief Note:  Patient presents for cholecystostomy tube exchanged due to excessive leakage.  Per Dr. Serafina Royals, injection today shows possible contrast within the peritoneum. Drain exchanged successfully.  Needs CT Abdomen Pelvis w/o contrast today.  Order placed.   Brynda Greathouse, MS RD PA-C

## 2019-11-06 ENCOUNTER — Other Ambulatory Visit (HOSPITAL_COMMUNITY): Payer: Medicare Other

## 2019-11-08 ENCOUNTER — Other Ambulatory Visit: Payer: Self-pay | Admitting: Surgery

## 2019-11-08 DIAGNOSIS — K819 Cholecystitis, unspecified: Secondary | ICD-10-CM | POA: Diagnosis not present

## 2019-12-10 ENCOUNTER — Other Ambulatory Visit (HOSPITAL_COMMUNITY)
Admission: RE | Admit: 2019-12-10 | Discharge: 2019-12-10 | Disposition: A | Discharge status: Home / Self Care | Payer: Medicare Other | Source: Ambulatory Visit | Attending: Surgery | Admitting: Surgery

## 2019-12-10 DIAGNOSIS — Z20822 Contact with and (suspected) exposure to covid-19: Secondary | ICD-10-CM | POA: Insufficient documentation

## 2019-12-10 DIAGNOSIS — Z01818 Encounter for other preprocedural examination: Secondary | ICD-10-CM | POA: Diagnosis not present

## 2019-12-10 LAB — SARS CORONAVIRUS 2 (TAT 6-24 HRS): SARS Coronavirus 2: NEGATIVE

## 2019-12-11 ENCOUNTER — Other Ambulatory Visit: Payer: Self-pay

## 2019-12-11 ENCOUNTER — Encounter (HOSPITAL_COMMUNITY): Payer: Self-pay

## 2019-12-11 ENCOUNTER — Encounter (HOSPITAL_COMMUNITY)
Admission: RE | Admit: 2019-12-11 | Discharge: 2019-12-11 | Disposition: A | Discharge status: Home / Self Care | Payer: Medicare Other | Source: Ambulatory Visit | Attending: Surgery | Admitting: Surgery

## 2019-12-11 DIAGNOSIS — K8066 Calculus of gallbladder and bile duct with acute and chronic cholecystitis without obstruction: Secondary | ICD-10-CM | POA: Diagnosis not present

## 2019-12-11 DIAGNOSIS — Z01818 Encounter for other preprocedural examination: Secondary | ICD-10-CM | POA: Insufficient documentation

## 2019-12-11 DIAGNOSIS — K8000 Calculus of gallbladder with acute cholecystitis without obstruction: Secondary | ICD-10-CM | POA: Diagnosis not present

## 2019-12-11 DIAGNOSIS — K851 Biliary acute pancreatitis without necrosis or infection: Secondary | ICD-10-CM | POA: Diagnosis not present

## 2019-12-11 DIAGNOSIS — I10 Essential (primary) hypertension: Secondary | ICD-10-CM | POA: Diagnosis not present

## 2019-12-11 DIAGNOSIS — Z8249 Family history of ischemic heart disease and other diseases of the circulatory system: Secondary | ICD-10-CM | POA: Diagnosis not present

## 2019-12-11 DIAGNOSIS — J45909 Unspecified asthma, uncomplicated: Secondary | ICD-10-CM | POA: Diagnosis not present

## 2019-12-11 DIAGNOSIS — A419 Sepsis, unspecified organism: Secondary | ICD-10-CM | POA: Diagnosis not present

## 2019-12-11 DIAGNOSIS — K8012 Calculus of gallbladder with acute and chronic cholecystitis without obstruction: Secondary | ICD-10-CM | POA: Diagnosis not present

## 2019-12-11 DIAGNOSIS — K8010 Calculus of gallbladder with chronic cholecystitis without obstruction: Secondary | ICD-10-CM | POA: Diagnosis present

## 2019-12-11 DIAGNOSIS — N179 Acute kidney failure, unspecified: Secondary | ICD-10-CM | POA: Diagnosis not present

## 2019-12-11 DIAGNOSIS — Z79899 Other long term (current) drug therapy: Secondary | ICD-10-CM | POA: Diagnosis not present

## 2019-12-11 LAB — CBC
HCT: 37.4 % (ref 36.0–46.0)
Hemoglobin: 11.5 g/dL — ABNORMAL LOW (ref 12.0–15.0)
MCH: 28.8 pg (ref 26.0–34.0)
MCHC: 30.7 g/dL (ref 30.0–36.0)
MCV: 93.7 fL (ref 80.0–100.0)
Platelets: 249 10*3/uL (ref 150–400)
RBC: 3.99 MIL/uL (ref 3.87–5.11)
RDW: 15.4 % (ref 11.5–15.5)
WBC: 5.1 10*3/uL (ref 4.0–10.5)
nRBC: 0 % (ref 0.0–0.2)

## 2019-12-11 LAB — BASIC METABOLIC PANEL
Anion gap: 8 (ref 5–15)
BUN: 8 mg/dL (ref 8–23)
CO2: 26 mmol/L (ref 22–32)
Calcium: 9.2 mg/dL (ref 8.9–10.3)
Chloride: 108 mmol/L (ref 98–111)
Creatinine, Ser: 0.89 mg/dL (ref 0.44–1.00)
GFR, Estimated: >60 mL/min (ref >60)
Glucose, Bld: 109 mg/dL — ABNORMAL HIGH (ref 70–99)
Potassium: 3.3 mmol/L — ABNORMAL LOW (ref 3.5–5.1)
Sodium: 142 mmol/L (ref 135–145)

## 2019-12-11 NOTE — Progress Notes (Signed)
PCP - denies Cardiologist - denies  Chest x-ray - not needed EKG - 4/44/21 Stress Test - denies ECHO - denies Cardiac Cath - denies  Blood Thinner Instructions:denies Aspirin Instructions:denies  ERAS Protcol - clears until 0430 am PRE-SURGERY Ensure or G2- Ensure given to patient  COVID TEST- Negative 12/10/19   Anesthesia review: NO  Patient denies shortness of breath, fever, cough and chest pain at PAT appointment   All instructions explained to the patient, with a verbal understanding of the material. Patient agrees to go over the instructions while at home for a better understanding. Patient also instructed to self quarantine after being tested for COVID-19. The opportunity to ask questions was provided.

## 2019-12-11 NOTE — Progress Notes (Signed)
[facility] [phone] - [place], [place] - [address] [place] [address] Phone: [phone] Fax: [phone]      Your procedure is scheduled on 12/13/19 at 07:30.  Report to [facility] Main Entrance "A" at 05:30 A.M., and check in at the Admitting office.  Call this number if you have problems the morning of surgery:  [phone]  Call [phone] if you have any questions prior to your surgery date Monday-Friday 8am-4pm     Remember:  Do not eat after midnight the night before your surgery  You may drink clear liquids until 0430am the morning of surgery  Clear liquids allowed are: Water, Non-Citrus Juices (without pulp), Carbonated Beverages, Clear Tea, Black Coffee Only, and Gatorade.       Take these medicines the morning of surgery with A SIP OF WATER  Acetaminophen (tylenol) if needed Albuterol (ventolin) if needed (Bring with you the day of surgery) Beclomethasone (QVAR)  Ondansetron(zofran) if needed  As of today, STOP taking any Aspirin (unless otherwise instructed by your surgeon) Aleve, Naproxen, Ibuprofen, Motrin, Advil, Goody's, BC's, all herbal medications, fish oil, and all vitamins.                      Do not wear jewelry, make up, or nail polish            Do not wear lotions, powders, perfumes/colognes, or deodorant.            Do not shave 48 hours prior to surgery.  Men may shave face and neck.            Do not bring valuables to the hospital.            [facility] is not responsible for any belongings or valuables.  Do NOT Smoke (Tobacco/Vaping) or drink Alcohol 24 hours prior to your procedure If you use a CPAP at night, you may bring all equipment for your overnight stay.   Contacts, glasses, dentures or bridgework may not be worn into surgery.      For patients admitted to the hospital, discharge time will be determined by your treatment team.   Patients discharged  the day of surgery will not be allowed to drive home, and someone needs to stay with them for 24 hours.    Special instructions:   [facility]- Preparing For Surgery  Before surgery, you can play an important role. Because skin is not sterile, your skin needs to be as free of germs as possible. You can reduce the number of germs on your skin by washing with CHG (chlorahexidine gluconate) Soap before surgery.  CHG is an antiseptic cleaner which kills germs and bonds with the skin to continue killing germs even after washing.    Oral Hygiene is also important to reduce your risk of infection.  Remember - BRUSH YOUR TEETH THE MORNING OF SURGERY WITH YOUR REGULAR TOOTHPASTE  Please do not use if you have an allergy to CHG or antibacterial soaps. If your skin becomes reddened/irritated stop using the CHG.  Do not shave (including legs and underarms) for at least 48 hours prior to first CHG shower. It is OK to shave your face.  Please follow these instructions carefully.   1. Shower the NIGHT BEFORE SURGERY and the MORNING OF SURGERY with CHG Soap.   2. If you chose to wash your hair, wash your hair first  as usual with your normal shampoo.  3. After you shampoo, rinse your hair and body thoroughly to remove the shampoo.  4. Use CHG as you would any other liquid soap. You can apply CHG directly to the skin and wash gently with a scrungie or a clean washcloth.   5. Apply the CHG Soap to your body ONLY FROM THE NECK DOWN.  Do not use on open wounds or open sores. Avoid contact with your eyes, ears, mouth and genitals (private parts). Wash Face and genitals (private parts)  with your normal soap.   6. Wash thoroughly, paying special attention to the area where your surgery will be performed.  7. Thoroughly rinse your body with warm water from the neck down.  8. DO NOT shower/wash with your normal soap after using and rinsing off the CHG Soap.  9. Pat yourself dry with a CLEAN TOWEL.  10. Wear  CLEAN PAJAMAS to bed the night before surgery  11. Place CLEAN SHEETS on your bed the night of your first shower and DO NOT SLEEP WITH PETS.   Day of Surgery: Wear Clean/Comfortable clothing the morning of surgery Do not apply any deodorants/lotions.   Remember to brush your teeth WITH YOUR REGULAR TOOTHPASTE.   Please read over the following fact sheets that you were given.

## 2019-12-12 NOTE — H&P (Signed)
   Gabriella Butler  Location: [facility] Patient #: [id number] DOB: [birth year] Single / Language: Gabriella Butler / Race: Black or African American Female   History of Present Illness (Gabriella Butler A. Ninfa Linden MD; 11/08/2019 10:41 AM) The patient is a 74 year old female who presents for evaluation of gall stones.  She is here today for a long-term follow-up regarding her gallstones, cholecystitis, and percutaneous cholecystostomy tube. Her tube was placed by interventional radiology in April of this year. It became dislodged in September and had to be replaced. She has not had definitive surgery yet because of medical issues and trying to get primary care. She is accompanied by her daughter. She has just finished a course of antibiotics. She denies abdominal pain.  Past Medical History:  Diagnosis Date  . Asthma   . Bronchitis   . Gout   . Hypertension     History reviewed. No pertinent surgical history.       Family History  Problem Relation Age of Onset  . Hypertension Father     Social History:  reports that she has never smoked. She has never used smokeless tobacco. She reports that she does not drink alcohol or use drugs.   Medication History (Gabriella Butler, Gabriella Butler;  Tylenol (325MG  Tablet, Oral) Active. Ventolin HFA (108 (90 Base)MCG/ACT Aerosol Soln, Inhalation) Active. MiraLax (17GM Packet, Oral) Active. Klor-Con 10 (10MEQ Tablet ER, Oral) Active. Senokot S (8.6-50MG  Tablet, Oral) Active. Albuterol Sulfate ((2.5 MG/3ML)0.083% Nebulized Soln, Inhalation) Active. Medications Reconciled  Vitals (Gabriella Butler CMA; 11/08/2019 10:11 AM Weight: 195.25 lb Height: 65in Body Surface Area: 1.96 m Body Mass Index: 32.49 kg/m  Temp.: 98.90F  Pulse: 111 (Regular)  P.OX: 99% (Room air) BP: 128/66(Sitting, Left Arm, Standard)       Physical Exam (Gabriella Butler A. Ninfa Linden MD; 11/08/2019 10:41 AM) The physical exam findings are as  follows: Note: On exam, she appears well. There is tenderness around the drain site and some drainage with a odor. There is no cellulitis.    Assessment & Plan  CHOLECYSTITIS (K81.9)  Impression: I had a long discussion with the patient and her daughter. At this point completely to go ahead and proceed with cholecystectomy given her overall clinical condition and issues she is having with the drain. We will get notes from her most recent primary care provider. I will schedule her for a cholecystectomy in November. We would attempt this laparoscopic but she is high risk for having to convert to an open procedure. I also discussed the risk which include but are not limited to bleeding, infection, injury to surrounding structures, again having an open procedure, cardiopulmonary issues, etc. we will keep her on antibiotics. She agrees to proceed to surgery

## 2019-12-12 NOTE — Anesthesia Preprocedure Evaluation (Addendum)
Anesthesia Evaluation  Patient identified by MRN, date of birth, ID band Patient awake    Reviewed: Allergy & Precautions, H&P , NPO status , Patient's Chart, lab work & pertinent test results  Airway Mallampati: II  TM Distance: >3 FB Neck ROM: Full    Dental no notable dental hx. (+) Poor Dentition, Chipped, Missing, Dental Advisory Given,    Pulmonary neg pulmonary ROS, asthma ,    Pulmonary exam normal breath sounds clear to auscultation       Cardiovascular Exercise Tolerance: Good hypertension, negative cardio ROS Normal cardiovascular exam Rhythm:Regular Rate:Normal     Neuro/Psych negative neurological ROS  negative psych ROS   GI/Hepatic negative GI ROS, Neg liver ROS,   Endo/Other  negative endocrine ROS  Renal/GU negative Renal ROS  negative genitourinary   Musculoskeletal negative musculoskeletal ROS (+)   Abdominal   Peds negative pediatric ROS (+)  Hematology negative hematology ROS (+)   Anesthesia Other Findings   Reproductive/Obstetrics negative OB ROS                            Anesthesia Physical Anesthesia Plan  ASA: III  Anesthesia Plan: General   Post-op Pain Management:    Induction: Intravenous  PONV Risk Score and Plan: 3 and Ondansetron, Dexamethasone and Treatment may vary due to age or medical condition  Airway Management Planned: Oral ETT  Additional Equipment:   Intra-op Plan:   Post-operative Plan: Extubation in OR  Informed Consent: I have reviewed the patients History and Physical, chart, labs and discussed the procedure including the risks, benefits and alternatives for the proposed anesthesia with the patient or authorized representative who has indicated his/her understanding and acceptance.       Plan Discussed with: Anesthesiologist and CRNA  Anesthesia Plan Comments: (  )        Anesthesia Quick Evaluation

## 2019-12-13 ENCOUNTER — Ambulatory Visit (HOSPITAL_COMMUNITY): Payer: Medicare Other | Admitting: Certified Registered Nurse Anesthetist

## 2019-12-13 ENCOUNTER — Inpatient Hospital Stay (HOSPITAL_COMMUNITY)
Admission: RE | Admit: 2019-12-13 | Discharge: 2019-12-18 | DRG: 418 | Disposition: A | Discharge status: Home / Self Care | Payer: Medicare Other | Attending: Surgery | Admitting: Surgery

## 2019-12-13 ENCOUNTER — Encounter (HOSPITAL_COMMUNITY): Admission: RE | Disposition: A | Discharge status: Home / Self Care | Payer: Self-pay | Source: Home / Self Care

## 2019-12-13 ENCOUNTER — Encounter (HOSPITAL_COMMUNITY): Payer: Self-pay | Admitting: Surgery

## 2019-12-13 DIAGNOSIS — Z9049 Acquired absence of other specified parts of digestive tract: Secondary | ICD-10-CM

## 2019-12-13 DIAGNOSIS — K8012 Calculus of gallbladder with acute and chronic cholecystitis without obstruction: Secondary | ICD-10-CM | POA: Diagnosis not present

## 2019-12-13 DIAGNOSIS — I10 Essential (primary) hypertension: Secondary | ICD-10-CM | POA: Diagnosis not present

## 2019-12-13 DIAGNOSIS — K8000 Calculus of gallbladder with acute cholecystitis without obstruction: Secondary | ICD-10-CM | POA: Diagnosis not present

## 2019-12-13 DIAGNOSIS — Z8249 Family history of ischemic heart disease and other diseases of the circulatory system: Secondary | ICD-10-CM

## 2019-12-13 DIAGNOSIS — N179 Acute kidney failure, unspecified: Secondary | ICD-10-CM | POA: Diagnosis not present

## 2019-12-13 DIAGNOSIS — A419 Sepsis, unspecified organism: Secondary | ICD-10-CM | POA: Diagnosis not present

## 2019-12-13 DIAGNOSIS — K8066 Calculus of gallbladder and bile duct with acute and chronic cholecystitis without obstruction: Secondary | ICD-10-CM | POA: Diagnosis not present

## 2019-12-13 DIAGNOSIS — J45909 Unspecified asthma, uncomplicated: Secondary | ICD-10-CM | POA: Diagnosis present

## 2019-12-13 DIAGNOSIS — Z79899 Other long term (current) drug therapy: Secondary | ICD-10-CM

## 2019-12-13 DIAGNOSIS — K851 Biliary acute pancreatitis without necrosis or infection: Secondary | ICD-10-CM | POA: Diagnosis not present

## 2019-12-13 HISTORY — PX: CHOLECYSTECTOMY: SHX55

## 2019-12-13 SURGERY — LAPAROSCOPIC CHOLECYSTECTOMY
Anesthesia: General | Site: Abdomen

## 2019-12-13 MED ORDER — SUCCINYLCHOLINE CHLORIDE 200 MG/10ML IV SOSY
PREFILLED_SYRINGE | INTRAVENOUS | Status: DC | PRN
  Administered 2019-12-13: 120 mg via INTRAVENOUS

## 2019-12-13 MED ORDER — MEPERIDINE HCL 25 MG/ML IJ SOLN: 6.2500 mg | INTRAMUSCULAR | Status: DC | PRN

## 2019-12-13 MED ORDER — ONDANSETRON HCL 4 MG/2ML IJ SOLN: 4.0000 mg | Freq: Once | INTRAMUSCULAR | Status: DC | PRN

## 2019-12-13 MED ORDER — ONDANSETRON HCL 4 MG/2ML IJ SOLN
INTRAMUSCULAR | Status: AC
  Filled 2019-12-13: qty 2

## 2019-12-13 MED ORDER — ENSURE PRE-SURGERY PO LIQD: 296.0000 mL | Freq: Once | ORAL | Status: DC

## 2019-12-13 MED ORDER — OXYCODONE HCL 5 MG PO TABS: 5.0000 mg | ORAL_TABLET | ORAL | Status: DC | PRN

## 2019-12-13 MED ORDER — ROCURONIUM BROMIDE 10 MG/ML (PF) SYRINGE
PREFILLED_SYRINGE | INTRAVENOUS | Status: AC
  Filled 2019-12-13: qty 10

## 2019-12-13 MED ORDER — CHLORHEXIDINE GLUCONATE 0.12 % MT SOLN
15.0000 mL | Freq: Once | OROMUCOSAL | Status: AC
  Administered 2019-12-13: 15 mL via OROMUCOSAL
  Filled 2019-12-13: qty 15

## 2019-12-13 MED ORDER — ACETAMINOPHEN 160 MG/5ML PO SOLN: 325.0000 mg | ORAL | Status: DC | PRN

## 2019-12-13 MED ORDER — FENTANYL CITRATE (PF) 250 MCG/5ML IJ SOLN
INTRAMUSCULAR | Status: DC | PRN
  Administered 2019-12-13: 100 ug via INTRAVENOUS
  Administered 2019-12-13: 50 ug via INTRAVENOUS

## 2019-12-13 MED ORDER — ENOXAPARIN SODIUM 40 MG/0.4ML ~~LOC~~ SOLN
40.0000 mg | SUBCUTANEOUS | Status: DC
  Administered 2019-12-14 – 2019-12-18 (×5): 40 mg via SUBCUTANEOUS
  Filled 2019-12-13 (×5): qty 0.4

## 2019-12-13 MED ORDER — PROPOFOL 10 MG/ML IV BOLUS
INTRAVENOUS | Status: AC
  Filled 2019-12-13: qty 40

## 2019-12-13 MED ORDER — ACETAMINOPHEN 160 MG/5ML PO SOLN: 960.0000 mg | Freq: Once | ORAL | Status: AC

## 2019-12-13 MED ORDER — ONDANSETRON HCL 4 MG/2ML IJ SOLN
4.0000 mg | Freq: Four times a day (QID) | INTRAMUSCULAR | Status: DC | PRN
  Administered 2019-12-13 – 2019-12-15 (×5): 4 mg via INTRAVENOUS
  Filled 2019-12-13 (×5): qty 2

## 2019-12-13 MED ORDER — ROCURONIUM BROMIDE 10 MG/ML (PF) SYRINGE
PREFILLED_SYRINGE | INTRAVENOUS | Status: DC | PRN
  Administered 2019-12-13: 50 mg via INTRAVENOUS

## 2019-12-13 MED ORDER — ACETAMINOPHEN 500 MG PO TABS
1000.0000 mg | ORAL_TABLET | ORAL | Status: DC
  Filled 2019-12-13: qty 2

## 2019-12-13 MED ORDER — CHLORHEXIDINE GLUCONATE CLOTH 2 % EX PADS: 6.0000 | MEDICATED_PAD | Freq: Once | CUTANEOUS | Status: DC

## 2019-12-13 MED ORDER — ACETAMINOPHEN 160 MG/5ML PO SOLN
ORAL | Status: AC
  Administered 2019-12-13: 960 mg via ORAL
  Filled 2019-12-13: qty 40.6

## 2019-12-13 MED ORDER — DEXAMETHASONE SODIUM PHOSPHATE 10 MG/ML IJ SOLN
INTRAMUSCULAR | Status: DC | PRN
  Administered 2019-12-13: 4 mg via INTRAVENOUS

## 2019-12-13 MED ORDER — ONDANSETRON HCL 4 MG/2ML IJ SOLN
INTRAMUSCULAR | Status: DC | PRN
  Administered 2019-12-13: 4 mg via INTRAVENOUS

## 2019-12-13 MED ORDER — ACETAMINOPHEN 325 MG PO TABS: 325.0000 mg | ORAL_TABLET | ORAL | Status: DC | PRN

## 2019-12-13 MED ORDER — SUCCINYLCHOLINE CHLORIDE 200 MG/10ML IV SOSY
PREFILLED_SYRINGE | INTRAVENOUS | Status: AC
  Filled 2019-12-13: qty 10

## 2019-12-13 MED ORDER — HEMOSTATIC AGENTS (NO CHARGE) OPTIME
TOPICAL | Status: DC | PRN
  Administered 2019-12-13: 1 via TOPICAL

## 2019-12-13 MED ORDER — OXYCODONE HCL 5 MG/5ML PO SOLN: 5.0000 mg | Freq: Once | ORAL | Status: DC | PRN

## 2019-12-13 MED ORDER — LACTATED RINGERS IV SOLN: INTRAVENOUS | Status: DC

## 2019-12-13 MED ORDER — TRAMADOL HCL 50 MG PO TABS: 50.0000 mg | ORAL_TABLET | Freq: Four times a day (QID) | ORAL | Status: DC | PRN

## 2019-12-13 MED ORDER — ALBUTEROL SULFATE (2.5 MG/3ML) 0.083% IN NEBU: 2.5000 mg | INHALATION_SOLUTION | RESPIRATORY_TRACT | Status: DC | PRN

## 2019-12-13 MED ORDER — PROPOFOL 10 MG/ML IV BOLUS
INTRAVENOUS | Status: DC | PRN
  Administered 2019-12-13: 120 mg via INTRAVENOUS
  Administered 2019-12-13: 10 mg via INTRAVENOUS

## 2019-12-13 MED ORDER — SUGAMMADEX SODIUM 200 MG/2ML IV SOLN
INTRAVENOUS | Status: DC | PRN
  Administered 2019-12-13: 200 mg via INTRAVENOUS

## 2019-12-13 MED ORDER — DIPHENHYDRAMINE HCL 50 MG/ML IJ SOLN: 12.5000 mg | Freq: Four times a day (QID) | INTRAMUSCULAR | Status: DC | PRN

## 2019-12-13 MED ORDER — BUPIVACAINE HCL 0.25 % IJ SOLN
INTRAMUSCULAR | Status: DC | PRN
  Administered 2019-12-13: 20 mL

## 2019-12-13 MED ORDER — 0.9 % SODIUM CHLORIDE (POUR BTL) OPTIME
TOPICAL | Status: DC | PRN
  Administered 2019-12-13: 1000 mL

## 2019-12-13 MED ORDER — LIDOCAINE 2% (20 MG/ML) 5 ML SYRINGE
INTRAMUSCULAR | Status: DC | PRN
  Administered 2019-12-13: 60 mg via INTRAVENOUS

## 2019-12-13 MED ORDER — LIDOCAINE 2% (20 MG/ML) 5 ML SYRINGE
INTRAMUSCULAR | Status: AC
  Filled 2019-12-13: qty 5

## 2019-12-13 MED ORDER — FENTANYL CITRATE (PF) 250 MCG/5ML IJ SOLN
INTRAMUSCULAR | Status: AC
  Filled 2019-12-13: qty 5

## 2019-12-13 MED ORDER — MIDAZOLAM HCL 2 MG/2ML IJ SOLN
INTRAMUSCULAR | Status: AC
  Filled 2019-12-13: qty 2

## 2019-12-13 MED ORDER — DIPHENHYDRAMINE HCL 12.5 MG/5ML PO ELIX: 12.5000 mg | ORAL_SOLUTION | Freq: Four times a day (QID) | ORAL | Status: DC | PRN

## 2019-12-13 MED ORDER — BUPIVACAINE HCL (PF) 0.25 % IJ SOLN
INTRAMUSCULAR | Status: AC
  Filled 2019-12-13: qty 30

## 2019-12-13 MED ORDER — HYDROMORPHONE HCL 1 MG/ML IJ SOLN: 1.0000 mg | INTRAMUSCULAR | Status: DC | PRN

## 2019-12-13 MED ORDER — GABAPENTIN 100 MG PO CAPS
100.0000 mg | ORAL_CAPSULE | ORAL | Status: DC
  Filled 2019-12-13: qty 1

## 2019-12-13 MED ORDER — SODIUM CHLORIDE 0.9 % IR SOLN
Status: DC | PRN
  Administered 2019-12-13: 1

## 2019-12-13 MED ORDER — SODIUM CHLORIDE 0.9 % IV SOLN
2.0000 g | Freq: Two times a day (BID) | INTRAVENOUS | Status: DC
  Administered 2019-12-13 – 2019-12-17 (×8): 2 g via INTRAVENOUS
  Filled 2019-12-13 (×9): qty 2

## 2019-12-13 MED ORDER — OXYCODONE HCL 5 MG PO TABS: 5.0000 mg | ORAL_TABLET | Freq: Once | ORAL | Status: DC | PRN

## 2019-12-13 MED ORDER — ORAL CARE MOUTH RINSE: 15.0000 mL | Freq: Once | OROMUCOSAL | Status: AC

## 2019-12-13 MED ORDER — FENTANYL CITRATE (PF) 100 MCG/2ML IJ SOLN: 25.0000 ug | INTRAMUSCULAR | Status: DC | PRN

## 2019-12-13 MED ORDER — CEFAZOLIN SODIUM-DEXTROSE 2-4 GM/100ML-% IV SOLN
2.0000 g | INTRAVENOUS | Status: AC
  Administered 2019-12-13: 2 g via INTRAVENOUS
  Filled 2019-12-13: qty 100

## 2019-12-13 MED ORDER — ONDANSETRON 4 MG PO TBDP: 4.0000 mg | ORAL_TABLET | Freq: Four times a day (QID) | ORAL | Status: DC | PRN

## 2019-12-13 MED ORDER — DEXAMETHASONE SODIUM PHOSPHATE 10 MG/ML IJ SOLN
INTRAMUSCULAR | Status: AC
  Filled 2019-12-13: qty 1

## 2019-12-13 SURGICAL SUPPLY — 35 items
APPLIER CLIP 5 13 M/L LIGAMAX5 (MISCELLANEOUS) ×3
CANISTER SUCT 3000ML PPV (MISCELLANEOUS) ×3 IMPLANT
CHLORAPREP W/TINT 26 (MISCELLANEOUS) ×3 IMPLANT
CLIP APPLIE 5 13 M/L LIGAMAX5 (MISCELLANEOUS) ×1 IMPLANT
COVER SURGICAL LIGHT HANDLE (MISCELLANEOUS) ×3 IMPLANT
COVER WAND RF STERILE (DRAPES) ×3 IMPLANT
DERMABOND ADVANCED (GAUZE/BANDAGES/DRESSINGS) ×2
DERMABOND ADVANCED .7 DNX12 (GAUZE/BANDAGES/DRESSINGS) ×1 IMPLANT
ELECT REM PT RETURN 9FT ADLT (ELECTROSURGICAL) ×3
ELECTRODE REM PT RTRN 9FT ADLT (ELECTROSURGICAL) ×1 IMPLANT
ENDOLOOP SUT PDS II  0 18 (SUTURE) ×3
ENDOLOOP SUT PDS II 0 18 (SUTURE) ×1 IMPLANT
GLOVE SURG SIGNA 7.5 PF LTX (GLOVE) ×3 IMPLANT
GOWN STRL REUS W/ TWL LRG LVL3 (GOWN DISPOSABLE) ×2 IMPLANT
GOWN STRL REUS W/ TWL XL LVL3 (GOWN DISPOSABLE) ×1 IMPLANT
GOWN STRL REUS W/TWL LRG LVL3 (GOWN DISPOSABLE) ×6
GOWN STRL REUS W/TWL XL LVL3 (GOWN DISPOSABLE) ×3
HEMOSTAT SNOW SURGICEL 2X4 (HEMOSTASIS) ×3 IMPLANT
KIT BASIN OR (CUSTOM PROCEDURE TRAY) ×3 IMPLANT
KIT TURNOVER KIT B (KITS) ×3 IMPLANT
NS IRRIG 1000ML POUR BTL (IV SOLUTION) ×3 IMPLANT
PAD ARMBOARD 7.5X6 YLW CONV (MISCELLANEOUS) ×3 IMPLANT
POUCH SPECIMEN RETRIEVAL 10MM (ENDOMECHANICALS) ×3 IMPLANT
SCISSORS LAP 5X35 DISP (ENDOMECHANICALS) ×3 IMPLANT
SET IRRIG TUBING LAPAROSCOPIC (IRRIGATION / IRRIGATOR) ×3 IMPLANT
SET TUBE SMOKE EVAC HIGH FLOW (TUBING) ×3 IMPLANT
SLEEVE ENDOPATH XCEL 5M (ENDOMECHANICALS) ×6 IMPLANT
SPECIMEN JAR SMALL (MISCELLANEOUS) ×3 IMPLANT
SUT MNCRL AB 4-0 PS2 18 (SUTURE) ×3 IMPLANT
TOWEL GREEN STERILE (TOWEL DISPOSABLE) ×3 IMPLANT
TOWEL GREEN STERILE FF (TOWEL DISPOSABLE) ×3 IMPLANT
TRAY LAPAROSCOPIC MC (CUSTOM PROCEDURE TRAY) ×3 IMPLANT
TROCAR XCEL BLUNT TIP 100MML (ENDOMECHANICALS) ×3 IMPLANT
TROCAR XCEL NON-BLD 5MMX100MML (ENDOMECHANICALS) ×3 IMPLANT
WATER STERILE IRR 1000ML POUR (IV SOLUTION) ×3 IMPLANT

## 2019-12-13 NOTE — Progress Notes (Signed)
Pt arrived from PACU very sleepy. Pt still somewhat groggy at shift change.  Urinating using a purewick. Naseous, so not wanting to take in fluids.

## 2019-12-13 NOTE — Anesthesia Procedure Notes (Signed)
Procedure Name: Intubation Date/Time: 12/13/2019 7:28 AM Performed by: Colin Benton, CRNA Pre-anesthesia Checklist: Patient identified, Emergency Drugs available, Suction available and Patient being monitored Patient Re-evaluated:Patient Re-evaluated prior to induction Oxygen Delivery Method: Circle system utilized Preoxygenation: Pre-oxygenation with 100% oxygen Induction Type: IV induction, Rapid sequence and Cricoid Pressure applied Laryngoscope Size: Miller and 2 Grade View: Grade I Tube type: Oral Tube size: 7.0 mm Number of attempts: 1 Airway Equipment and Method: Stylet and Oral airway Placement Confirmation: ETT inserted through vocal cords under direct vision,  positive ETCO2 and breath sounds checked- equal and bilateral Secured at: 22 cm Tube secured with: Tape Dental Injury: Teeth and Oropharynx as per pre-operative assessment

## 2019-12-13 NOTE — Anesthesia Postprocedure Evaluation (Signed)
Anesthesia Post Note  Patient: Gabriella Butler  Procedure(s) Performed: LAPAROSCOPIC CHOLECYSTECTOMY (N/A Abdomen)     Patient location during evaluation: PACU Anesthesia Type: General Level of consciousness: awake and alert Pain management: pain level controlled Vital Signs Assessment: post-procedure vital signs reviewed and stable Respiratory status: spontaneous breathing, nonlabored ventilation, respiratory function stable and patient connected to nasal cannula oxygen Cardiovascular status: blood pressure returned to baseline and stable Postop Assessment: no apparent nausea or vomiting Anesthetic complications: no   No complications documented.  Last Vitals:  Vitals:   12/13/19 1145 12/13/19 1200  BP: (!) 145/75   Pulse: 86   Resp: 20   Temp:  (!) 36.3 C  SpO2: 97%     Last Pain:  Vitals:   12/13/19 1140  TempSrc:   PainSc: Asleep                 [name]

## 2019-12-13 NOTE — Transfer of Care (Signed)
Immediate Anesthesia Transfer of Care Note  Patient: Gabriella Butler  Procedure(s) Performed: LAPAROSCOPIC CHOLECYSTECTOMY (N/A Abdomen)  Patient Location: PACU  Anesthesia Type:General  Level of Consciousness: drowsy  Airway & Oxygen Therapy: Patient Spontanous Breathing and Patient connected to nasal cannula oxygen  Post-op Assessment: Report given to RN and Post -op Vital signs reviewed and stable  Post vital signs: Reviewed and stable  Last Vitals:  Vitals Value Taken Time  BP 158/88 12/13/19 0846  Temp    Pulse 88 12/13/19 0848  Resp 19 12/13/19 0848  SpO2 99 % 12/13/19 0848  Vitals shown include unvalidated device data.  Last Pain:  Vitals:   12/13/19 0628  TempSrc:   PainSc: 0-No pain         Complications: No complications documented.

## 2019-12-13 NOTE — Op Note (Signed)
LAPAROSCOPIC CHOLECYSTECTOMY  Procedure Note  Gabriella Butler 12/13/2019   Pre-op Diagnosis: CHOLECYSTITIS WITH CHOLELITHIASIS     Post-op Diagnosis: SAME  Procedure(s): LAPAROSCOPIC CHOLECYSTECTOMY  Surgeon(s): Coralie Keens, MD  Anesthesia: General  Staff:  Circulator: Cyd Silence, RN Scrub Person: Rozell Searing, RN; Lois Huxley  Estimated Blood Loss: Minimal               Specimens: sent to [facility]   Indications: This is a 74 year old female who is status post a percutaneous cholecystostomy tube placement in April of this year.  She is finally been cleared from medical standpoint to proceed with cholecystectomy.  She still has the drain in place.  Findings: She was found to still have a very inflamed gallbladder with purulence in the gallbladder.  Procedure: The patient was brought to the operating room and identifies correct patient.  She is placed on the operating table general anesthesia was induced.  Her abdomen was then prepped and draped in usual sterile fashion.  I made a vertical incision above the umbilicus with a scalpel.  I took this down to the fascia which was then opened with a scalpel.  Hemostat was used to pass into the peritoneal cavity under direct vision.  The patient's gallbladder was stuck underneath the liver with omentum stuck up to it and drain coming up to the abdominal wall with liver stuck to the abdominal wall as well.  I placed a 5 mm trocar the patient epigastrium and 2 more in the right upper quadrant under direct vision.  I was able to completely remove the drain from the skin and pull it out of the gallbladder.  I then was able to take omentum off of the gallbladder.  The gallbladder was quite inflamed and woody.  I was able to dissect out the base the gallbladder.  I finally identified the cystic duct and was able to achieve a critical window around it.  Identified the cystic artery as well and achieved a window around it.  I  clipped the duct proximally and distally and transected it.  I then clipped the cystic artery proximally distally and transected as well.  I then placed a Endoloop also on the cystic stump.  The gallbladder was inserted dissected free from the liver bed with electrocautery.  Again, there was purulence in the gallbladder.  Once the gallbladder is removed is placed in an Endosac and removed through the incision at the umbilicus.  I achieved hemostasis in the liver bed with the cautery and a piece of surgical snow.  I then copiously irrigated the abdomen normal saline.  Again hemostasis appeared to be achieved.  At this point all ports removed under direct vision the abdomen was deflated.  0 Vicryl the umbilicus was tied in place closing the fascial defect.  All incisions were then anesthetized with Marcaine and closed with 4-0 Monocryl sutures.  Dermabond was then applied.  The patient tolerated the procedure well.  All the counts were correct at the end of the procedure.  The patient was then extubated in the operating room and taken in stable addition to the recovery room.          Coralie Keens   Date: 12/13/2019  Time: 8:41 AM

## 2019-12-13 NOTE — Interval H&P Note (Signed)
History and Physical Interval Note:no change in H and P  12/13/2019 6:48 AM  Gabriella Butler  has presented today for surgery, with the diagnosis of CHOLECYSTITIS.  The various methods of treatment have been discussed with the patient and family. After consideration of risks, benefits and other options for treatment, the patient has consented to  Procedure(s): LAPAROSCOPIC POSSIBLE OPEN CHOLECYSTECTOMY (N/A) as a surgical intervention.  The patient's history has been reviewed, patient examined, no change in status, stable for surgery.  I have reviewed the patient's chart and labs.  Questions were answered to the patient's satisfaction.     Coralie Keens

## 2019-12-14 ENCOUNTER — Encounter (HOSPITAL_COMMUNITY): Payer: Self-pay | Admitting: Surgery

## 2019-12-14 DIAGNOSIS — K8012 Calculus of gallbladder with acute and chronic cholecystitis without obstruction: Secondary | ICD-10-CM | POA: Diagnosis present

## 2019-12-14 DIAGNOSIS — J45909 Unspecified asthma, uncomplicated: Secondary | ICD-10-CM | POA: Diagnosis present

## 2019-12-14 DIAGNOSIS — K8010 Calculus of gallbladder with chronic cholecystitis without obstruction: Secondary | ICD-10-CM | POA: Diagnosis present

## 2019-12-14 DIAGNOSIS — I10 Essential (primary) hypertension: Secondary | ICD-10-CM | POA: Diagnosis present

## 2019-12-14 DIAGNOSIS — Z8249 Family history of ischemic heart disease and other diseases of the circulatory system: Secondary | ICD-10-CM | POA: Diagnosis not present

## 2019-12-14 DIAGNOSIS — N179 Acute kidney failure, unspecified: Secondary | ICD-10-CM | POA: Diagnosis not present

## 2019-12-14 DIAGNOSIS — Z79899 Other long term (current) drug therapy: Secondary | ICD-10-CM | POA: Diagnosis not present

## 2019-12-14 LAB — COMPREHENSIVE METABOLIC PANEL
ALT: 18 U/L (ref 0–44)
AST: 46 U/L — ABNORMAL HIGH (ref 15–41)
Albumin: 2.8 g/dL — ABNORMAL LOW (ref 3.5–5.0)
Alkaline Phosphatase: 54 U/L (ref 38–126)
Anion gap: 10 (ref 5–15)
BUN: 8 mg/dL (ref 8–23)
CO2: 25 mmol/L (ref 22–32)
Calcium: 9.2 mg/dL (ref 8.9–10.3)
Chloride: 103 mmol/L (ref 98–111)
Creatinine, Ser: 1.1 mg/dL — ABNORMAL HIGH (ref 0.44–1.00)
GFR, Estimated: 53 mL/min — ABNORMAL LOW (ref >60)
Glucose, Bld: 163 mg/dL — ABNORMAL HIGH (ref 70–99)
Potassium: 3.7 mmol/L (ref 3.5–5.1)
Sodium: 138 mmol/L (ref 135–145)
Total Bilirubin: 0.6 mg/dL (ref 0.3–1.2)
Total Protein: 7 g/dL (ref 6.5–8.1)

## 2019-12-14 LAB — CBC
HCT: 42.2 % (ref 36.0–46.0)
Hemoglobin: 13.3 g/dL (ref 12.0–15.0)
MCH: 28.7 pg (ref 26.0–34.0)
MCHC: 31.5 g/dL (ref 30.0–36.0)
MCV: 91.1 fL (ref 80.0–100.0)
Platelets: 257 10*3/uL (ref 150–400)
RBC: 4.63 MIL/uL (ref 3.87–5.11)
RDW: 15.3 % (ref 11.5–15.5)
WBC: 13.8 10*3/uL — ABNORMAL HIGH (ref 4.0–10.5)
nRBC: 0 % (ref 0.0–0.2)

## 2019-12-14 MED ORDER — HYDROCODONE-ACETAMINOPHEN 5-325 MG PO TABS: 1.0000 | ORAL_TABLET | Freq: Four times a day (QID) | ORAL | 0 refills | Status: AC | PRN

## 2019-12-14 MED ORDER — PROCHLORPERAZINE EDISYLATE 10 MG/2ML IJ SOLN
10.0000 mg | Freq: Four times a day (QID) | INTRAMUSCULAR | Status: DC | PRN
  Administered 2019-12-14: 10 mg via INTRAVENOUS
  Filled 2019-12-14: qty 2

## 2019-12-14 NOTE — Care Management Obs Status (Signed)
[facility] NOTIFICATION   Patient Details  Name: Gabriella Butler MRN: [id number] Date of Birth: [birth year]   Medicare Observation Status Notification Given:  Yes    Norina Buzzard, RN 12/14/2019, 2:21 PM

## 2019-12-14 NOTE — Care Management Obs Status (Signed)
[facility] NOTIFICATION   Patient Details  Name: Gabriella Butler MRN: [id number] Date of Birth: 22-Dec-1945   Medicare Observation Status Notification Given:  Yes    Norina Buzzard, RN 12/14/2019, 2:22 PM

## 2019-12-14 NOTE — Discharge Summary (Signed)
Physician Discharge Summary  Patient ID: Gabriella Butler MRN: [id number] DOB/AGE: 1945/03/06 74 y.o.  PCP: Patient, No Pcp Per  Admit date: 12/13/2019 Discharge date: 12/14/2019  Admission Diagnoses:  Chronic cholecystitis  Discharge Diagnoses:  same  Active Problems:   S/P laparoscopic cholecystectomy   Surgery:  Lap cholecystectomy  Discharged Condition: improved  Hospital Course:   This was an interval cholecystectomy after percutaneous drainage.  Dr. Ninfa Linden removed the gallbladder and she is ready for discharge.  Consults: none  Significant Diagnostic Studies: none    Discharge Exam: Blood pressure (!) 155/80, pulse 90, temperature 98.2 F (36.8 C), temperature source Oral, resp. rate 17, height 5\' 5"  (1.651 m), weight 87.9 kg, SpO2 100 %. Incisions ok  Disposition: Discharge disposition: 01-Home or Self Care       Discharge Instructions    Call MD for:  persistant nausea and vomiting   Complete by: As directed    Call MD for:  temperature >100.4   Complete by: As directed    Diet - low sodium heart healthy   Complete by: As directed    Discharge instructions   Complete by: As directed    Advance diet as tolerated May shower tomorrow   Increase activity slowly   Complete by: As directed    No wound care   Complete by: As directed      Allergies as of 12/14/2019   No Known Allergies     Medication List    TAKE these medications   acetaminophen 325 MG tablet Commonly known as: TYLENOL Take 2 tablets (650 mg total) by mouth every 6 (six) hours as needed for mild pain (or Fever >/= 101).   albuterol 108 (90 Base) MCG/ACT inhaler Commonly known as: VENTOLIN HFA Inhale 1-2 puffs into the lungs every 4 (four) hours as needed for wheezing or shortness of breath.   beclomethasone 40 MCG/ACT inhaler Commonly known as: QVAR Inhale 1 puff into the lungs 2 (two) times daily.   HYDROcodone-acetaminophen 5-325 MG tablet Commonly known as:  NORCO/VICODIN Take 1 tablet by mouth every 6 (six) hours as needed for moderate pain.   ondansetron 4 MG tablet Commonly known as: ZOFRAN Take 1 tablet (4 mg total) by mouth every 6 (six) hours as needed for nausea.   polyethylene glycol 17 g packet Commonly known as: MIRALAX / GLYCOLAX Take 17 g by mouth daily. Please hold for diarrhea   potassium chloride 10 MEQ tablet Commonly known as: KLOR-CON Take 1 tablet (10 mEq total) by mouth daily.   senna-docusate 8.6-50 MG tablet Commonly known as: Senokot-S Take 2 tablets by mouth 2 (two) times daily.       Follow-up Information    Coralie Keens, MD. Go on 01/06/2020.   Specialty: General Surgery Why: 250pm. Please arrive 30 minutes prior to your appointment. Please bring a copy of your photo ID and insurance card.  Contact information: [address] [place] [address] [phone]               Signed: Pedro Earls 12/14/2019, 9:29 AM

## 2019-12-15 ENCOUNTER — Other Ambulatory Visit: Payer: Self-pay

## 2019-12-15 MED ORDER — ENSURE ENLIVE PO LIQD
237.0000 mL | Freq: Two times a day (BID) | ORAL | Status: DC
  Administered 2019-12-15 – 2019-12-16 (×2): 237 mL via ORAL

## 2019-12-15 NOTE — Progress Notes (Signed)
Patient ID: Gabriella Butler, female   DOB: [birth year], 74 y.o.   MRN: [id number] [facility] Surgery Progress Note:   2 Days Post-Op  Subjective: Mental status is clear and talkative.  Complaints sense of taste altered. Objective: Vital signs in last 24 hours: Temp:  [98.4 F (36.9 C)-99.2 F (37.3 C)] 99.1 F (37.3 C) (11/14 0514) Pulse Rate:  [83-95] 94 (11/14 0514) Resp:  [16-18] 18 (11/14 0514) BP: (148-160)/(71-88) 160/76 (11/14 0514) SpO2:  [97 %-99 %] 97 % (11/14 0514)  Intake/Output from previous day: 11/13 0701 - 11/14 0700 In: 400 [IV Piggyback:400] Out: 400 [Urine:400] Intake/Output this shift: No intake/output data recorded.  Physical Exam: Work of breathing is not labored.  Incisions bland.    Lab Results:  Results for orders placed or performed during the hospital encounter of 12/13/19 (from the past 48 hour(s))  Comprehensive metabolic panel     Status: Abnormal   Collection Time: 12/14/19  1:44 AM  Result Value Ref Range   Sodium 138 135 - 145 mmol/L   Potassium 3.7 3.5 - 5.1 mmol/L   Chloride 103 98 - 111 mmol/L   CO2 25 22 - 32 mmol/L   Glucose, Bld 163 (H) 70 - 99 mg/dL    Comment: Glucose reference range applies only to samples taken after fasting for at least 8 hours.   BUN 8 8 - 23 mg/dL   Creatinine, Ser 1.10 (H) 0.44 - 1.00 mg/dL   Calcium 9.2 8.9 - 10.3 mg/dL   Total Protein 7.0 6.5 - 8.1 g/dL   Albumin 2.8 (L) 3.5 - 5.0 g/dL   AST 46 (H) 15 - 41 U/L   ALT 18 0 - 44 U/L   Alkaline Phosphatase 54 38 - 126 U/L   Total Bilirubin 0.6 0.3 - 1.2 mg/dL   GFR, Estimated 53 (L) >60 mL/min    Comment: (NOTE) Calculated using the CKD-EPI Creatinine Equation (2021)    Anion gap 10 5 - 15    Comment: Performed at [facility] [address], [place], [place] [address]  CBC     Status: Abnormal   Collection Time: 12/14/19  1:44 AM  Result Value Ref Range   WBC 13.8 (H) 4.0 - 10.5 K/uL   RBC 4.63 3.87 - 5.11 MIL/uL   Hemoglobin 13.3 12.0 - 15.0  g/dL   HCT 42.2 36 - 46 %   MCV 91.1 80.0 - 100.0 fL   MCH 28.7 26.0 - 34.0 pg   MCHC 31.5 30.0 - 36.0 g/dL   RDW 15.3 11.5 - 15.5 %   Platelets 257 150 - 400 K/uL   nRBC 0.0 0.0 - 0.2 %    Comment: Performed at [facility], [place] [address], [place], [place] [address]    Radiology/Results: No results found.  Anti-infectives: Anti-infectives (From admission, onward)   Start     Dose/Rate Route Frequency Ordered Stop   12/13/19 1500  cefoTEtan (CEFOTAN) 2 g in sodium chloride 0.9 % 100 mL IVPB        2 g 200 mL/hr over 30 Minutes Intravenous Every 12 hours 12/13/19 1400     12/13/19 0615  ceFAZolin (ANCEF) IVPB 2g/100 mL premix        2 g 200 mL/hr over 30 Minutes Intravenous On call to O.R. 12/13/19 0611 12/13/19 0732      Assessment/Plan: Problem List: Patient Active Problem List   Diagnosis Date Noted  . S/P laparoscopic cholecystectomy 12/13/2019  . Sepsis (  [place]) 05/23/2019  . Hypomagnesemia 05/23/2019  . Hypokalemia 05/23/2019  . Obesity (BMI 30-39.9) 05/23/2019  . History of prediabetes 05/23/2019  . Right lower lobe pneumonia 05/23/2019  . Elevated glucose level   . Acute gallstone pancreatitis 12/06/2017  . Cholelithiasis with acute cholecystitis 12/06/2017  . Essential hypertension 12/06/2017  . Asthma 12/06/2017  . Abnormal magnetic resonance cholangiopancreatography (MRCP)   . Choledocholithiasis     Nausea and intolerance of PO yesterday cancelled her discharge.  Although she is not currently vomiting, her PO intake is marginal.  Will try full liquid diet.  Recent Covid negative and she doesn't have any other symptoms of Covid.   2 Days Post-Op    LOS: 1 day   Matt B. Hassell Done, MD, [facility] [phone] to reach the surgeon on call.    12/15/2019 9:30 AM

## 2019-12-16 LAB — SURGICAL PATHOLOGY

## 2019-12-16 NOTE — Plan of Care (Signed)
  Problem: Education: Goal: Required Educational Video(s) Outcome: Progressing   Problem: Clinical Measurements: Goal: Postoperative complications will be avoided or minimized Outcome: Progressing   Problem: Skin Integrity: Goal: Demonstration of wound healing without infection will improve Outcome: Progressing   Problem: Education: Goal: Knowledge of General Education information will improve Description: Including pain rating scale, medication(s)/side effects and non-pharmacologic comfort measures Outcome: Progressing   Problem: Health Behavior/Discharge Planning: Goal: Ability to manage health-related needs will improve Outcome: Progressing   Problem: Clinical Measurements: Goal: Ability to maintain clinical measurements within normal limits will improve Outcome: Progressing Goal: Will remain free from infection Outcome: Progressing Goal: Diagnostic test results will improve Outcome: Progressing Goal: Respiratory complications will improve Outcome: Progressing Goal: Cardiovascular complication will be avoided Outcome: Progressing   Problem: Activity: Goal: Risk for activity intolerance will decrease Outcome: Progressing   Problem: Coping: Goal: Level of anxiety will decrease Outcome: Progressing   Problem: Elimination: Goal: Will not experience complications related to bowel motility Outcome: Progressing Goal: Will not experience complications related to urinary retention Outcome: Progressing   Problem: Pain Managment: Goal: General experience of comfort will improve Outcome: Progressing   Problem: Safety: Goal: Ability to remain free from injury will improve Outcome: Progressing   Problem: Skin Integrity: Goal: Risk for impaired skin integrity will decrease Outcome: Progressing   Problem: Education: Goal: Knowledge of General Education information will improve Description: Including pain rating scale, medication(s)/side effects and non-pharmacologic  comfort measures Outcome: Progressing   Problem: Health Behavior/Discharge Planning: Goal: Ability to manage health-related needs will improve Outcome: Progressing   Problem: Clinical Measurements: Goal: Ability to maintain clinical measurements within normal limits will improve Outcome: Progressing Goal: Will remain free from infection Outcome: Progressing Goal: Diagnostic test results will improve Outcome: Progressing Goal: Respiratory complications will improve Outcome: Progressing Goal: Cardiovascular complication will be avoided Outcome: Progressing   Problem: Activity: Goal: Risk for activity intolerance will decrease Outcome: Progressing   Problem: Coping: Goal: Level of anxiety will decrease Outcome: Progressing   Problem: Elimination: Goal: Will not experience complications related to bowel motility Outcome: Progressing Goal: Will not experience complications related to urinary retention Outcome: Progressing   Problem: Pain Managment: Goal: General experience of comfort will improve Outcome: Progressing   Problem: Safety: Goal: Ability to remain free from injury will improve Outcome: Progressing   Problem: Skin Integrity: Goal: Risk for impaired skin integrity will decrease Outcome: Progressing

## 2019-12-16 NOTE — Progress Notes (Signed)
Patient is in bed sleeping, easily aroused when entering the room. Patient is reluctant to eat or drink, Boost plus was given and patient stated " I will try my best to drink it." Patient asked if she ordered breakfast tis morning she said no and asked her if she wanted to order lunch since we can advance her diet and she no, she said she would start off with the ensure first and see how she tolerated it.  Encouragement was given to patient about her intake. Patient acknowledged and said she would do her best.

## 2019-12-16 NOTE — Progress Notes (Signed)
3 Days Post-Op   Subjective/Chief Complaint: Pt con't with some nausea int Tol FLD No BMs   Objective: Vital signs in last 24 hours: Temp:  [98.5 F (36.9 C)-98.9 F (37.2 C)] 98.5 F (36.9 C) (11/15 0400) Pulse Rate:  [85-96] 96 (11/15 0400) Resp:  [18] 18 (11/15 0400) BP: (154-166)/(76-88) 166/88 (11/15 0400) SpO2:  [95 %-100 %] 97 % (11/15 0400) Last BM Date: 12/12/19  Intake/Output from previous day: 11/14 0701 - 11/15 0700 In: -  Out: 900 [Urine:900] Intake/Output this shift: No intake/output data recorded.  General appearance: alert and cooperative GI: soft, non-tender; bowel sounds normal; no masses,  no organomegaly and inc c/d/i  Lab Results:  Recent Labs    12/14/19 0144  WBC 13.8*  HGB 13.3  HCT 42.2  PLT 257   BMET Recent Labs    12/14/19 0144  NA 138  K 3.7  CL 103  CO2 25  GLUCOSE 163*  BUN 8  CREATININE 1.10*  CALCIUM 9.2   PT/INR No results for input(s): LABPROT, INR in the last 72 hours. ABG No results for input(s): PHART, HCO3 in the last 72 hours.  Invalid input(s): PCO2, PO2  Studies/Results: No results found.  Anti-infectives: Anti-infectives (From admission, onward)   Start     Dose/Rate Route Frequency Ordered Stop   12/13/19 1500  cefoTEtan (CEFOTAN) 2 g in sodium chloride 0.9 % 100 mL IVPB        2 g 200 mL/hr over 30 Minutes Intravenous Every 12 hours 12/13/19 1400     12/13/19 0615  ceFAZolin (ANCEF) IVPB 2g/100 mL premix        2 g 200 mL/hr over 30 Minutes Intravenous On call to O.R. 12/13/19 [id number] 12/13/19 0732      Assessment/Plan: s/p Procedure(s): LAPAROSCOPIC CHOLECYSTECTOMY (N/A) Ileus-supp for BM, con't FLD Mobilize Likely home in next 1-2d  LOS: 2 days    Ralene Ok 12/16/2019

## 2019-12-17 LAB — COMPREHENSIVE METABOLIC PANEL
ALT: 18 U/L (ref 0–44)
AST: 30 U/L (ref 15–41)
Albumin: 2.1 g/dL — ABNORMAL LOW (ref 3.5–5.0)
Alkaline Phosphatase: 98 U/L (ref 38–126)
Anion gap: 9 (ref 5–15)
BUN: <5 mg/dL — ABNORMAL LOW (ref 8–23)
CO2: 27 mmol/L (ref 22–32)
Calcium: 8.5 mg/dL — ABNORMAL LOW (ref 8.9–10.3)
Chloride: 104 mmol/L (ref 98–111)
Creatinine, Ser: 0.93 mg/dL (ref 0.44–1.00)
GFR, Estimated: >60 mL/min (ref >60)
Glucose, Bld: 80 mg/dL (ref 70–99)
Potassium: 3.1 mmol/L — ABNORMAL LOW (ref 3.5–5.1)
Sodium: 140 mmol/L (ref 135–145)
Total Bilirubin: 0.7 mg/dL (ref 0.3–1.2)
Total Protein: 6.2 g/dL — ABNORMAL LOW (ref 6.5–8.1)

## 2019-12-17 LAB — CBC
HCT: 32.6 % — ABNORMAL LOW (ref 36.0–46.0)
Hemoglobin: 10.4 g/dL — ABNORMAL LOW (ref 12.0–15.0)
MCH: 28.7 pg (ref 26.0–34.0)
MCHC: 31.9 g/dL (ref 30.0–36.0)
MCV: 89.8 fL (ref 80.0–100.0)
Platelets: 256 10*3/uL (ref 150–400)
RBC: 3.63 MIL/uL — ABNORMAL LOW (ref 3.87–5.11)
RDW: 15.2 % (ref 11.5–15.5)
WBC: 7.4 10*3/uL (ref 4.0–10.5)
nRBC: 0 % (ref 0.0–0.2)

## 2019-12-17 MED ORDER — ENSURE ENLIVE PO LIQD
237.0000 mL | Freq: Three times a day (TID) | ORAL | Status: DC
  Administered 2019-12-17: 237 mL via ORAL

## 2019-12-17 MED ORDER — BISACODYL 10 MG RE SUPP
10.0000 mg | Freq: Once | RECTAL | Status: DC
  Filled 2019-12-17: qty 1

## 2019-12-17 MED ORDER — ADULT MULTIVITAMIN W/MINERALS CH
1.0000 | ORAL_TABLET | Freq: Every day | ORAL | Status: DC
  Administered 2019-12-17 – 2019-12-18 (×2): 1 via ORAL
  Filled 2019-12-17 (×2): qty 1

## 2019-12-17 MED ORDER — ACETAMINOPHEN 325 MG PO TABS: 650.0000 mg | ORAL_TABLET | Freq: Four times a day (QID) | ORAL | Status: DC | PRN

## 2019-12-17 MED ORDER — AMOXICILLIN-POT CLAVULANATE 875-125 MG PO TABS
1.0000 | ORAL_TABLET | Freq: Two times a day (BID) | ORAL | Status: DC
  Administered 2019-12-17 – 2019-12-18 (×2): 1 via ORAL
  Filled 2019-12-17 (×3): qty 1

## 2019-12-17 NOTE — Progress Notes (Signed)
Patient sitting up in bed eating dinner. Patient requested to take ABX later tonight because she does not want to risk losing her taste for the food.

## 2019-12-17 NOTE — Progress Notes (Addendum)
4 Days Post-Op    CC: Abdominal pain  Subjective: Patient's only complaint is nausea after eating.  She also said the food has no taste.  She has not had a BM since admission.  She has Dulcolax and Senokot listed his admission medicines.  Her chest is clear and her abdomen is soft nontender.  Port sites all look good.  Objective: Vital signs in last 24 hours: Temp:  [98.6 F (37 C)-99.1 F (37.3 C)] 99.1 F (37.3 C) (11/15 2200) Pulse Rate:  [80-86] 81 (11/16 0512) Resp:  [16-18] 18 (11/16 0512) BP: (133-162)/(65-75) 133/65 (11/16 0512) SpO2:  [94 %-99 %] 98 % (11/16 0512) Last BM Date:  (pta) No intake/output recorded yesterday T-max 99.1 last temperature was at 1900 last evening No labs  Intake/Output from previous day: No intake/output data recorded. Intake/Output this shift: No intake/output data recorded.  General appearance: alert, cooperative and no distress Resp: clear to auscultation bilaterally GI: Soft, she did not complain of soreness.  Positive bowel sounds port sites all look good no BM.  Lab Results:  No results for input(s): WBC, HGB, HCT, PLT in the last 72 hours.  BMET No results for input(s): NA, K, CL, CO2, GLUCOSE, BUN, CREATININE, CALCIUM in the last 72 hours. PT/INR No results for input(s): LABPROT, INR in the last 72 hours.  Recent Labs  Lab 12/14/19 0144  AST 46*  ALT 18  ALKPHOS 54  BILITOT 0.6  PROT 7.0  ALBUMIN 2.8*     Lipase     Component Value Date/Time   LIPASE 23 12/07/2017 0524     Prior to Admission medications   Medication Sig Start Date End Date Taking? Authorizing Provider  albuterol (VENTOLIN HFA) 108 (90 Base) MCG/ACT inhaler Inhale 1-2 puffs into the lungs every 4 (four) hours as needed for wheezing or shortness of breath. 04/23/19  Yes Sherwood Gambler, MD  acetaminophen (TYLENOL) 325 MG tablet Take 2 tablets (650 mg total) by mouth every 6 (six) hours as needed for mild pain (or Fever >/= 101). Patient not taking:  Reported on 12/09/2019 05/28/19   Elgergawy, Silver Huguenin, MD  beclomethasone (QVAR) 40 MCG/ACT inhaler Inhale 1 puff into the lungs 2 (two) times daily. Patient not taking: Reported on 04/22/2019 02/04/17   Dorie Rank, MD  HYDROcodone-acetaminophen (NORCO/VICODIN) 5-325 MG tablet Take 1 tablet by mouth every 6 (six) hours as needed for moderate pain. 12/14/19   Johnathan Hausen, MD  ondansetron (ZOFRAN) 4 MG tablet Take 1 tablet (4 mg total) by mouth every 6 (six) hours as needed for nausea. Patient not taking: Reported on 04/22/2019 12/07/17   Raiford Noble Latif, DO  polyethylene glycol (MIRALAX / GLYCOLAX) 17 g packet Take 17 g by mouth daily. Please hold for diarrhea Patient not taking: Reported on 12/09/2019 05/28/19   Elgergawy, Silver Huguenin, MD  potassium chloride (KLOR-CON) 10 MEQ tablet Take 1 tablet (10 mEq total) by mouth daily. Patient not taking: Reported on 12/09/2019 06/11/19   Elgergawy, Silver Huguenin, MD  senna-docusate (SENOKOT-S) 8.6-50 MG tablet Take 2 tablets by mouth 2 (two) times daily. Patient not taking: Reported on 12/09/2019 05/28/19   Elgergawy, Silver Huguenin, MD    Medications: . enoxaparin (LOVENOX) injection  40 mg Subcutaneous Q24H  . feeding supplement  237 mL Oral TID BM  . multivitamin with minerals  1 tablet Oral Daily   . cefoTEtan (CEFOTAN) IV 2 g (12/17/19 1194)    Assessment/Plan Hx right lower lobe pneumonia AKI Hx asthma Hypertension  Acute cholecystitis-cholelithiasis/sepsis with IR PERC drain placement 05/23/2019 Laparoscopic cholecystectomy 12/13/2019, DR. Coralie Keens POD #4  FEN: Soft diet ID: Ancef x1 - 11/12; cefotetan 11/12 >> day 5/ will need a total of 10 days antibiotics. DVT: Lovenox Pain: Patient is taking nothing for pain Follow-up: Dr. Ninfa Linden 01/06/2020  Plan: Restart her MiraLAX, and get PT to get her up and walk around make sure she is safe to go home.  She does have a cane at her bedside.  Despite complaints of nausea and will see where she has  had any Zofran over the last 24 hours.  I will check on duration of antibiotics/recheck labs, and hopefully aim for discharge later today.  PT has seen the patient and she is independent, she does not need any PT at home.  WBC is normal, labs are fairly stable, K+ low and will slowly replace.    LOS: 3 days    Gabriella Butler 12/17/2019 Please see Amion

## 2019-12-17 NOTE — Progress Notes (Signed)
Initial Nutrition Assessment  DOCUMENTATION CODES:   Obesity unspecified  INTERVENTION:   Recommend diet liberalization  Increase to Ensure Enlive po TID, each supplement provides 350 kcal and 20 grams of protein  MVI daily  NUTRITION DIAGNOSIS:   Predicted suboptimal nutrient intake related to nausea as evidenced by per patient/family report.    GOAL:   Patient will meet greater than or equal to 90% of their needs    MONITOR:   PO intake, Supplement acceptance, Weight trends, Labs, I & O's  REASON FOR ASSESSMENT:   Malnutrition Screening Tool    ASSESSMENT:   Pt presented for long-term follow-up regarding gallstones, cholecystitis with cholelithiasis, and percutaneous cholecystostomy tube (placed in April 2021 and had to be replaced September 2021). Pt underwent laparoscopic cholecystectomy on 11/12. PMH includes HTN and gout.  Pt sleeping at time of RD visit and did not wake to voice/touch. No family at bedside. Observed Ensure Plus at bedside, ~50% completed (pt has orders to receive BID). Per RN, pt reporting intermittent nausea, but is overall tolerating diet.   Reviewed wt history. No significant wt changes noted.   No PO intake documented.   No UOP documented.  Labs and medications reviewed.  NUTRITION - FOCUSED PHYSICAL EXAM:    Most Recent Value  Orbital Region No depletion  Upper Arm Region Mild depletion  Thoracic and Lumbar Region No depletion  Buccal Region No depletion  Temple Region No depletion  Clavicle Bone Region Mild depletion  Clavicle and Acromion Bone Region No depletion  Scapular Bone Region No depletion  Dorsal Hand No depletion  Patellar Region No depletion  Anterior Thigh Region Mild depletion  Posterior Calf Region Mild depletion  Edema (RD Assessment) None  Hair Reviewed  Eyes Reviewed  Mouth Reviewed  Skin Other (Comment)  [dry]  Nails Reviewed       Diet Order:   Diet Order            DIET SOFT Room service  appropriate? Yes; Fluid consistency: Thin  Diet effective now           Diet - low sodium heart healthy                 EDUCATION NEEDS:   No education needs have been identified at this time  Skin:  Skin Assessment: Skin Integrity Issues: Skin Integrity Issues:: Incisions Incisions: abdomen  Last BM:  PTA  Height:   Ht Readings from Last 1 Encounters:  12/13/19 5\' 5"  (1.651 m)    Weight:   Wt Readings from Last 1 Encounters:  12/13/19 87.9 kg    BMI:  Body mass index is 32.25 kg/m.  Estimated Nutritional Needs:   Kcal:  1800-2000  Protein:  90-100 grams  Fluid:  >1.8L/d    Gabriella Ina, MS, RD, LDN RD pager number and weekend/on-call pager number located in [place].

## 2019-12-17 NOTE — Plan of Care (Signed)
  Problem: Education: Goal: Required Educational Video(s) Outcome: Progressing   Problem: Clinical Measurements: Goal: Postoperative complications will be avoided or minimized Outcome: Progressing   Problem: Skin Integrity: Goal: Demonstration of wound healing without infection will improve Outcome: Progressing   Problem: Education: Goal: Knowledge of General Education information will improve Description: Including pain rating scale, medication(s)/side effects and non-pharmacologic comfort measures Outcome: Progressing   Problem: Health Behavior/Discharge Planning: Goal: Ability to manage health-related needs will improve Outcome: Progressing   Problem: Clinical Measurements: Goal: Ability to maintain clinical measurements within normal limits will improve Outcome: Progressing Goal: Will remain free from infection Outcome: Progressing Goal: Diagnostic test results will improve Outcome: Progressing Goal: Respiratory complications will improve Outcome: Progressing Goal: Cardiovascular complication will be avoided Outcome: Progressing   Problem: Activity: Goal: Risk for activity intolerance will decrease Outcome: Progressing   Problem: Coping: Goal: Level of anxiety will decrease Outcome: Progressing   Problem: Elimination: Goal: Will not experience complications related to bowel motility Outcome: Progressing Goal: Will not experience complications related to urinary retention Outcome: Progressing   Problem: Pain Managment: Goal: General experience of comfort will improve Outcome: Progressing   Problem: Safety: Goal: Ability to remain free from injury will improve Outcome: Progressing   Problem: Skin Integrity: Goal: Risk for impaired skin integrity will decrease Outcome: Progressing   Problem: Education: Goal: Knowledge of General Education information will improve Description: Including pain rating scale, medication(s)/side effects and non-pharmacologic  comfort measures Outcome: Progressing   Problem: Health Behavior/Discharge Planning: Goal: Ability to manage health-related needs will improve Outcome: Progressing   Problem: Clinical Measurements: Goal: Ability to maintain clinical measurements within normal limits will improve Outcome: Progressing Goal: Will remain free from infection Outcome: Progressing Goal: Diagnostic test results will improve Outcome: Progressing Goal: Respiratory complications will improve Outcome: Progressing Goal: Cardiovascular complication will be avoided Outcome: Progressing   Problem: Activity: Goal: Risk for activity intolerance will decrease Outcome: Progressing   Problem: Coping: Goal: Level of anxiety will decrease Outcome: Progressing   Problem: Elimination: Goal: Will not experience complications related to bowel motility Outcome: Progressing Goal: Will not experience complications related to urinary retention Outcome: Progressing   Problem: Pain Managment: Goal: General experience of comfort will improve Outcome: Progressing   Problem: Safety: Goal: Ability to remain free from injury will improve Outcome: Progressing   Problem: Skin Integrity: Goal: Risk for impaired skin integrity will decrease Outcome: Progressing

## 2019-12-17 NOTE — Discharge Instructions (Signed)
[facility]  Please arrive at least 30 min before your appointment to complete your check in paperwork.  If you are unable to arrive 30 min prior to your appointment time we may have to cancel or reschedule you. LAPAROSCOPIC SURGERY: POST OP INSTRUCTIONS Always review your discharge instruction sheet given to you by the facility where your surgery was performed. IF YOU HAVE DISABILITY OR FAMILY LEAVE FORMS, YOU MUST BRING THEM TO THE OFFICE FOR PROCESSING.   DO NOT GIVE THEM TO YOUR DOCTOR.  PAIN CONTROL  1. First take acetaminophen (Tylenol) AND/or ibuprofen (Advil) to control your pain after surgery.  Follow directions on package.  Taking acetaminophen (Tylenol) and/or ibuprofen (Advil) regularly after surgery will help to control your pain and lower the amount of prescription pain medication you may need.  You should not take more than 4,000 mg (4 grams) of acetaminophen (Tylenol) in 24 hours.  You should not take ibuprofen (Advil), aleve, motrin, naprosyn or other NSAIDS if you have a history of stomach ulcers or chronic kidney disease.  2. A prescription for pain medication may be given to you upon discharge.  Take your pain medication as prescribed, if you still have uncontrolled pain after taking acetaminophen (Tylenol) or ibuprofen (Advil). 3. Use ice packs to help control pain. 4. If you need a refill on your pain medication, please contact your pharmacy.  They will contact our office to request authorization. Prescriptions will not be filled after 5pm or on week-ends.  HOME MEDICATIONS 5. Take your usually prescribed medications unless otherwise directed.  DIET 6. You should follow a light diet the first few days after arrival home.  Be sure to include lots of fluids daily. Avoid fatty, fried foods.   CONSTIPATION 7. It is common to experience some constipation after surgery and if you are taking pain medication.  Increasing fluid intake and taking a stool  softener (such as Colace) will usually help or prevent this problem from occurring.  A mild laxative (Milk of Magnesia or Miralax) should be taken according to package instructions if there are no bowel movements after 48 hours.  WOUND/INCISION CARE 8. Most patients will experience some swelling and bruising in the area of the incisions.  Ice packs will help.  Swelling and bruising can take several days to resolve.  9. Unless discharge instructions indicate otherwise, follow guidelines below  a. STERI-STRIPS - you may remove your outer bandages 48 hours after surgery, and you may shower at that time.  You have steri-strips (small skin tapes) in place directly over the incision.  These strips should be left on the skin for 7-10 days.   b. DERMABOND/SKIN GLUE - you may shower in 24 hours.  The glue will flake off over the next 2-3 weeks. 10. Any sutures or staples will be removed at the office during your follow-up visit.  ACTIVITIES 11. You may resume regular (light) daily activities beginning the next day--such as daily self-care, walking, climbing stairs--gradually increasing activities as tolerated.  You may have sexual intercourse when it is comfortable.  Refrain from any heavy lifting or straining until approved by your doctor. a. You may drive when you are no longer taking prescription pain medication, you can comfortably wear a seatbelt, and you can safely maneuver your car and apply brakes.  FOLLOW-UP 12. You should see your doctor in the office for a follow-up appointment approximately 2-3 weeks after your surgery.  You should have been given your post-op/follow-up appointment when   your surgery was scheduled.  If you did not receive a post-op/follow-up appointment, make sure that you call for this appointment within a day or two after you arrive home to insure a convenient appointment time.   WHEN TO CALL YOUR DOCTOR: 1. Fever over 101.0 2. Inability to urinate 3. Continued bleeding from  incision. 4. Increased pain, redness, or drainage from the incision. 5. Increasing abdominal pain  The clinic staff is available to answer your questions during regular business hours.  Please don't hesitate to call and ask to speak to one of the nurses for clinical concerns.  If you have a medical emergency, go to the nearest emergency room or call 911.  A surgeon from [facility] is always on call at the hospital. [address], [place], [place], [place]  [address] ? [address], [place], [place]   [address] [phone] ? [phone] ? FAX [phone]    Gallbladder Eating Plan If you have a gallbladder condition, you may have trouble digesting fats. Eating a low-fat diet can help reduce your symptoms, and may be helpful before and after having surgery to remove your gallbladder (cholecystectomy). Your health care provider may recommend that you work with a diet and nutrition specialist (dietitian) to help you reduce the amount of fat in your diet. What are tips for following this plan? General guidelines  Limit your fat intake to less than 30% of your total daily calories. If you eat around 1,800 calories each day, this is less than 60 grams (g) of fat per day.  Fat is an important part of a healthy diet. Eating a low-fat diet can make it hard to maintain a healthy body weight. Ask your dietitian how much fat, calories, and other nutrients you need each day.  Eat small, frequent meals throughout the day instead of three large meals.  Drink at least 8-10 cups of fluid a day. Drink enough fluid to keep your urine clear or pale yellow.  Limit alcohol intake to no more than 1 drink a day for nonpregnant women and 2 drinks a day for men. One drink equals 12 oz of beer, 5 oz of wine, or 1 oz of hard liquor. Reading food labels  Check Nutrition Facts on food labels for the amount of fat per serving. Choose foods with less than 3 grams of fat per  serving. Shopping  Choose nonfat and low-fat healthy foods. Look for the words "nonfat," "low fat," or "fat free."  Avoid buying processed or prepackaged foods. Cooking  Cook using low-fat methods, such as baking, broiling, grilling, or boiling.  Cook with small amounts of healthy fats, such as olive oil, grapeseed oil, canola oil, or sunflower oil. What foods are recommended?   All fresh, frozen, or canned fruits and vegetables.  Whole grains.  Low-fat or non-fat (skim) milk and yogurt.  Lean meat, skinless poultry, fish, eggs, and beans.  Low-fat protein supplement powders or drinks.  Spices and herbs. What foods are not recommended?  High-fat foods. These include baked goods, fast food, fatty cuts of meat, ice cream, french toast, sweet rolls, pizza, cheese bread, foods covered with butter, creamy sauces, or cheese.  Fried foods. These include french fries, tempura, battered fish, breaded chicken, fried breads, and sweets.  Foods with strong odors.  Foods that cause bloating and gas. Summary  A low-fat diet can be helpful if you have a gallbladder condition, or before and after gallbladder surgery.  Limit your fat intake to  less than 30% of your total daily calories. This is about 60 g of fat if you eat 1,800 calories each day.  Eat small, frequent meals throughout the day instead of three large meals. This information is not intended to replace advice given to you by your health care provider. Make sure you discuss any questions you have with your health care provider. Document Revised: 05/10/2018 Document Reviewed: 02/25/2016 Elsevier Patient Education  [place].

## 2019-12-17 NOTE — Evaluation (Signed)
Physical Therapy Evaluation Patient Details Name: Gabriella Butler MRN: [id number] DOB: 1945/09/25 Today's Date: 12/17/2019   History of Present Illness  74yo female with ongoing issues with gallstones, cholecystitis, percutaneous cholecystostomy tube. Recived laparoscopic cholecystectomy on 12/13/19. PMH gout, asthma, HTN  Clinical Impression   Patient received in bed, very pleasant and cooperative with therapies. Able to mobilize on a mod(I) to distant S level today with SPC. Did need VC for technique for log rolling to reduce abdominal discomfort with bed mobility, otherwise safe and steady with UUE support, reports she is generally at her functional baseline. Left up in recliner with all needs met. Will continue to benefit from regular therapy in the acute setting, do not anticipate need for f/u therapies at DC.     Follow Up Recommendations No PT follow up    Equipment Recommendations  None recommended by PT    Recommendations for Other Services       Precautions / Restrictions Precautions Precautions: Other (comment) Precaution Comments: abdominal incisions Restrictions Weight Bearing Restrictions: No      Mobility  Bed Mobility Overal bed mobility: Modified Independent             General bed mobility comments: VC for rolling to avoid discomfort at incision sites    Transfers Overall transfer level: Modified independent Equipment used: Straight cane             General transfer comment: able to complete with distant S, no unsteadiness or safety impairment noted  Ambulation/Gait Ambulation/Gait assistance: Supervision Gait Distance (Feet): 160 Feet Assistive device: Straight cane Gait Pattern/deviations: Step-through pattern;Decreased step length - right;Decreased step length - left;Decreased stride length;Trunk flexed Gait velocity: decreased   General Gait Details: mildly antalgic and mild trunk flexion due to antalgia at stomach incisions, otherwise  WNL and steady/safe with [facility]  Stairs            Wheelchair Mobility    Modified Rankin (Stroke Patients Only)       Balance Overall balance assessment: Mild deficits observed, not formally tested                                           Pertinent Vitals/Pain Pain Assessment: No/denies pain    Home Living Family/patient expects to be discharged to:: Private residence Living Arrangements: Children (daughter and grand-daughter live with her) Available Help at Discharge: Family;Available PRN/intermittently Type of Home: Apartment Home Access: Level entry     Home Layout: One level Home Equipment: Cane - single point      Prior Function Level of Independence: Independent with assistive device(s)         Comments: no falls or close calls     Hand Dominance        Extremity/Trunk Assessment   Upper Extremity Assessment Upper Extremity Assessment: Overall WFL for tasks assessed    Lower Extremity Assessment Lower Extremity Assessment: Overall WFL for tasks assessed    Cervical / Trunk Assessment Cervical / Trunk Assessment: Kyphotic  Communication   Communication: No difficulties  Cognition Arousal/Alertness: Awake/alert Behavior During Therapy: WFL for tasks assessed/performed Overall Cognitive Status: Within Functional Limits for tasks assessed                                 General Comments: A&Ox4, very pleasant and cooperative  General Comments      Exercises     Assessment/Plan    PT Assessment Patient needs continued PT services  PT Problem List Decreased strength;Obesity;Decreased activity tolerance;Decreased mobility;Decreased coordination       PT Treatment Interventions DME instruction;Balance training;Gait training;Stair training;Patient/family education;Therapeutic activities;Functional mobility training;Therapeutic exercise    PT Goals (Current goals can be found in the Care Plan section)   Acute Rehab PT Goals Patient Stated Goal: go home today PT Goal Formulation: With patient Time For Goal Achievement: 12/31/19 Potential to Achieve Goals: Good    Frequency Min 3X/week   Barriers to discharge        Co-evaluation               AM-PAC PT "6 Clicks" Mobility  Outcome Measure Help needed turning from your back to your side while in a flat bed without using bedrails?: None Help needed moving from lying on your back to sitting on the side of a flat bed without using bedrails?: None Help needed moving to and from a bed to a chair (including a wheelchair)?: None Help needed standing up from a chair using your arms (e.g., wheelchair or bedside chair)?: None Help needed to walk in hospital room?: A Little Help needed climbing 3-5 steps with a railing? : A Little 6 Click Score: 22    End of Session   Activity Tolerance: Patient tolerated treatment well Patient left: in chair;with call bell/phone within reach Nurse Communication: Mobility status PT Visit Diagnosis: Muscle weakness (generalized) (M62.81)    Time: 4696-2952 PT Time Calculation (min) (ACUTE ONLY): 20 min   Charges:   PT Evaluation $PT Eval Low Complexity: 1 Low          Windell Norfolk, DPT, PN1   Supplemental Physical Therapist [place]    Pager [phone] Acute Rehab Office [phone]

## 2019-12-17 NOTE — Plan of Care (Signed)
Problem: Education: Goal: Required Educational Video(s) 12/17/2019 0150 by Keturah Shavers, RN Outcome: Progressing 12/17/2019 0150 by Keturah Shavers, RN Outcome: Progressing   Problem: Clinical Measurements: Goal: Postoperative complications will be avoided or minimized 12/17/2019 0150 by Keturah Shavers, RN Outcome: Progressing 12/17/2019 0150 by Keturah Shavers, RN Outcome: Progressing   Problem: Skin Integrity: Goal: Demonstration of wound healing without infection will improve 12/17/2019 0150 by Keturah Shavers, RN Outcome: Progressing 12/17/2019 0150 by Keturah Shavers, RN Outcome: Progressing   Problem: Education: Goal: Knowledge of General Education information will improve Description: Including pain rating scale, medication(s)/side effects and non-pharmacologic comfort measures 12/17/2019 0150 by Keturah Shavers, RN Outcome: Progressing 12/17/2019 0150 by Keturah Shavers, RN Outcome: Progressing   Problem: Health Behavior/Discharge Planning: Goal: Ability to manage health-related needs will improve 12/17/2019 0150 by Keturah Shavers, RN Outcome: Progressing 12/17/2019 0150 by Keturah Shavers, RN Outcome: Progressing   Problem: Clinical Measurements: Goal: Ability to maintain clinical measurements within normal limits will improve 12/17/2019 0150 by Keturah Shavers, RN Outcome: Progressing 12/17/2019 0150 by Keturah Shavers, RN Outcome: Progressing Goal: Will remain free from infection 12/17/2019 0150 by Keturah Shavers, RN Outcome: Progressing 12/17/2019 0150 by Keturah Shavers, RN Outcome: Progressing Goal: Diagnostic test results will improve 12/17/2019 0150 by Keturah Shavers, RN Outcome: Progressing 12/17/2019 0150 by Keturah Shavers, RN Outcome: Progressing Goal: Respiratory complications will improve 12/17/2019 0150 by Keturah Shavers, RN Outcome: Progressing 12/17/2019 0150 by Keturah Shavers, RN Outcome: Progressing Goal:  Cardiovascular complication will be avoided 12/17/2019 0150 by Keturah Shavers, RN Outcome: Progressing 12/17/2019 0150 by Keturah Shavers, RN Outcome: Progressing   Problem: Activity: Goal: Risk for activity intolerance will decrease 12/17/2019 0150 by Keturah Shavers, RN Outcome: Progressing 12/17/2019 0150 by Keturah Shavers, RN Outcome: Progressing   Problem: Nutrition: Goal: Adequate nutrition will be maintained 12/17/2019 0150 by Keturah Shavers, RN Outcome: Progressing 12/17/2019 0150 by Keturah Shavers, RN Outcome: Progressing   Problem: Coping: Goal: Level of anxiety will decrease 12/17/2019 0150 by Keturah Shavers, RN Outcome: Progressing 12/17/2019 0150 by Keturah Shavers, RN Outcome: Progressing   Problem: Elimination: Goal: Will not experience complications related to bowel motility 12/17/2019 0150 by Keturah Shavers, RN Outcome: Progressing 12/17/2019 0150 by Keturah Shavers, RN Outcome: Progressing Goal: Will not experience complications related to urinary retention 12/17/2019 0150 by Keturah Shavers, RN Outcome: Progressing 12/17/2019 0150 by Keturah Shavers, RN Outcome: Progressing   Problem: Pain Managment: Goal: General experience of comfort will improve 12/17/2019 0150 by Keturah Shavers, RN Outcome: Progressing 12/17/2019 0150 by Keturah Shavers, RN Outcome: Progressing   Problem: Safety: Goal: Ability to remain free from injury will improve 12/17/2019 0150 by Keturah Shavers, RN Outcome: Progressing 12/17/2019 0150 by Keturah Shavers, RN Outcome: Progressing   Problem: Skin Integrity: Goal: Risk for impaired skin integrity will decrease 12/17/2019 0150 by Keturah Shavers, RN Outcome: Progressing 12/17/2019 0150 by Keturah Shavers, RN Outcome: Progressing   Problem: Education: Goal: Knowledge of General Education information will improve Description: Including pain rating scale, medication(s)/side effects and  non-pharmacologic comfort measures 12/17/2019 0150 by Keturah Shavers, RN Outcome: Progressing 12/17/2019 0150 by Keturah Shavers, RN Outcome: Progressing   Problem: Health Behavior/Discharge Planning: Goal: Ability to manage health-related needs will improve 12/17/2019 0150 by Keturah Shavers, RN Outcome: Progressing 12/17/2019 0150 by Keturah Shavers, RN Outcome: Progressing  Problem: Clinical Measurements: Goal: Ability to maintain clinical measurements within normal limits will improve 12/17/2019 0150 by Keturah Shavers, RN Outcome: Progressing 12/17/2019 0150 by Keturah Shavers, RN Outcome: Progressing Goal: Will remain free from infection 12/17/2019 0150 by Keturah Shavers, RN Outcome: Progressing 12/17/2019 0150 by Keturah Shavers, RN Outcome: Progressing Goal: Diagnostic test results will improve 12/17/2019 0150 by Keturah Shavers, RN Outcome: Progressing 12/17/2019 0150 by Keturah Shavers, RN Outcome: Progressing Goal: Respiratory complications will improve 12/17/2019 0150 by Keturah Shavers, RN Outcome: Progressing 12/17/2019 0150 by Keturah Shavers, RN Outcome: Progressing Goal: Cardiovascular complication will be avoided 12/17/2019 0150 by Keturah Shavers, RN Outcome: Progressing 12/17/2019 0150 by Keturah Shavers, RN Outcome: Progressing   Problem: Activity: Goal: Risk for activity intolerance will decrease 12/17/2019 0150 by Keturah Shavers, RN Outcome: Progressing 12/17/2019 0150 by Keturah Shavers, RN Outcome: Progressing   Problem: Nutrition: Goal: Adequate nutrition will be maintained 12/17/2019 0150 by Keturah Shavers, RN Outcome: Progressing 12/17/2019 0150 by Keturah Shavers, RN Outcome: Progressing   Problem: Coping: Goal: Level of anxiety will decrease 12/17/2019 0150 by Keturah Shavers, RN Outcome: Progressing 12/17/2019 0150 by Keturah Shavers, RN Outcome: Progressing   Problem: Elimination: Goal: Will not  experience complications related to bowel motility 12/17/2019 0150 by Keturah Shavers, RN Outcome: Progressing 12/17/2019 0150 by Keturah Shavers, RN Outcome: Progressing Goal: Will not experience complications related to urinary retention 12/17/2019 0150 by Keturah Shavers, RN Outcome: Progressing 12/17/2019 0150 by Keturah Shavers, RN Outcome: Progressing   Problem: Pain Managment: Goal: General experience of comfort will improve 12/17/2019 0150 by Keturah Shavers, RN Outcome: Progressing 12/17/2019 0150 by Keturah Shavers, RN Outcome: Progressing   Problem: Safety: Goal: Ability to remain free from injury will improve 12/17/2019 0150 by Keturah Shavers, RN Outcome: Progressing 12/17/2019 0150 by Keturah Shavers, RN Outcome: Progressing   Problem: Skin Integrity: Goal: Risk for impaired skin integrity will decrease 12/17/2019 0150 by Keturah Shavers, RN Outcome: Progressing 12/17/2019 0150 by Keturah Shavers, RN Outcome: Progressing

## 2019-12-17 NOTE — Care Management Important Message (Signed)
Important Message  Patient Details  Name: Gabriella Butler MRN: [id number] Date of Birth: 06-22-1945   Medicare Important Message Given:  Yes     Orbie Pyo 12/17/2019, 2:54 PM

## 2019-12-17 NOTE — Progress Notes (Signed)
Patient encouraged to order breakfast this morning and she did not, encouraged to order lunch and she does not, she states she does not have an appetite. Will continue to encourage patient to eat and if not we will place her on a default tray.

## 2019-12-18 MED ORDER — AMOXICILLIN-POT CLAVULANATE 875-125 MG PO TABS: 1.0000 | ORAL_TABLET | Freq: Two times a day (BID) | ORAL | 0 refills | Status: AC

## 2019-12-18 NOTE — Plan of Care (Signed)
  Problem: Education: Goal: Required Educational Video(s) Outcome: Adequate for Discharge   Problem: Clinical Measurements: Goal: Postoperative complications will be avoided or minimized Outcome: Adequate for Discharge   Problem: Skin Integrity: Goal: Demonstration of wound healing without infection will improve Outcome: Adequate for Discharge   Problem: Education: Goal: Knowledge of General Education information will improve Description: Including pain rating scale, medication(s)/side effects and non-pharmacologic comfort measures Outcome: Adequate for Discharge   Problem: Health Behavior/Discharge Planning: Goal: Ability to manage health-related needs will improve Outcome: Adequate for Discharge   Problem: Clinical Measurements: Goal: Ability to maintain clinical measurements within normal limits will improve Outcome: Adequate for Discharge Goal: Will remain free from infection Outcome: Adequate for Discharge Goal: Diagnostic test results will improve Outcome: Adequate for Discharge Goal: Respiratory complications will improve Outcome: Adequate for Discharge Goal: Cardiovascular complication will be avoided Outcome: Adequate for Discharge   Problem: Activity: Goal: Risk for activity intolerance will decrease Outcome: Adequate for Discharge   Problem: Nutrition: Goal: Adequate nutrition will be maintained Outcome: Adequate for Discharge   Problem: Coping: Goal: Level of anxiety will decrease Outcome: Adequate for Discharge   Problem: Elimination: Goal: Will not experience complications related to bowel motility Outcome: Adequate for Discharge Goal: Will not experience complications related to urinary retention Outcome: Adequate for Discharge   Problem: Pain Managment: Goal: General experience of comfort will improve Outcome: Adequate for Discharge   Problem: Safety: Goal: Ability to remain free from injury will improve Outcome: Adequate for Discharge    Problem: Skin Integrity: Goal: Risk for impaired skin integrity will decrease Outcome: Adequate for Discharge   Problem: Education: Goal: Knowledge of General Education information will improve Description: Including pain rating scale, medication(s)/side effects and non-pharmacologic comfort measures Outcome: Adequate for Discharge   Problem: Health Behavior/Discharge Planning: Goal: Ability to manage health-related needs will improve Outcome: Adequate for Discharge   Problem: Clinical Measurements: Goal: Ability to maintain clinical measurements within normal limits will improve Outcome: Adequate for Discharge Goal: Will remain free from infection Outcome: Adequate for Discharge Goal: Diagnostic test results will improve Outcome: Adequate for Discharge Goal: Respiratory complications will improve Outcome: Adequate for Discharge Goal: Cardiovascular complication will be avoided Outcome: Adequate for Discharge   Problem: Activity: Goal: Risk for activity intolerance will decrease Outcome: Adequate for Discharge   Problem: Nutrition: Goal: Adequate nutrition will be maintained Outcome: Adequate for Discharge   Problem: Coping: Goal: Level of anxiety will decrease Outcome: Adequate for Discharge   Problem: Elimination: Goal: Will not experience complications related to bowel motility Outcome: Adequate for Discharge Goal: Will not experience complications related to urinary retention Outcome: Adequate for Discharge   Problem: Pain Managment: Goal: General experience of comfort will improve Outcome: Adequate for Discharge   Problem: Safety: Goal: Ability to remain free from injury will improve Outcome: Adequate for Discharge   Problem: Skin Integrity: Goal: Risk for impaired skin integrity will decrease Outcome: Adequate for Discharge

## 2019-12-18 NOTE — Progress Notes (Signed)
Physical Therapy Treatment Patient Details Name: Gabriella Butler MRN: [id number] DOB: 1945-07-29 Today's Date: 12/18/2019    History of Present Illness 74 yo female with ongoing issues with gallstones, cholecystitis, percutaneous cholecystostomy tube. Recived laparoscopic cholecystectomy on 12/13/19. PMH gout, asthma, HTN    PT Comments    Pt up in chair on arrival, agreeable to therapy session and with good participation and tolerance for mobility tasks. Pt making good progress toward goals, able to perform bed mobility with flat HOB and no rails per home setup modI and transfers mostly modI (needs cues for controlled descent with stand>sit transfers). Pt progressed gait distance using SPC to 283ft and ascended/descended 2 steps with Supervision using cane, no LOB. Pt continues to benefit from PT services to progress toward functional mobility goals. D/C recs below remain appropriate, anticipate pt safe to D/C home with PRN S/A with mobility.   Follow Up Recommendations  No PT follow up     Equipment Recommendations  None recommended by PT    Recommendations for Other Services       Precautions / Restrictions Precautions Precautions: Other (comment) Precaution Comments: abdominal incisions Restrictions Weight Bearing Restrictions: No    Mobility  Bed Mobility Overal bed mobility: Modified Independent             General bed mobility comments: VC for rolling to avoid discomfort at incision sites  Transfers Overall transfer level: Needs assistance Equipment used: Straight cane Transfers: Sit to/from Stand Sit to Stand: Supervision;Modified independent (Device/Increase time)         General transfer comment: from EOB and chair heights, when sitting to chair from standing decreased eccentric control observed and reinforced with pt importance of B hand placement to arm rests to control descent  Ambulation/Gait Ambulation/Gait assistance: Supervision Gait Distance  (Feet): 200 Feet Assistive device: Straight cane Gait Pattern/deviations: Step-through pattern;Decreased step length - right;Decreased step length - left;Decreased stride length;Trunk flexed Gait velocity: decreased   General Gait Details: mildly antalgic but with use of SPC, no LOB; cues for upright posture   Stairs Stairs: Yes Stairs assistance: Supervision Stair Management: With cane Number of Stairs: 2 General stair comments: no LOB, step-to pattern   Wheelchair Mobility    Modified Rankin (Stroke Patients Only)       Balance Overall balance assessment: Mild deficits observed, not formally tested                                          Cognition Arousal/Alertness: Awake/alert Behavior During Therapy: WFL for tasks assessed/performed Overall Cognitive Status: Within Functional Limits for tasks assessed                                 General Comments: A&Ox4, very pleasant and cooperative      Exercises      General Comments        Pertinent Vitals/Pain Pain Assessment: No/denies pain   Vitals:   12/18/19 0446  BP: 139/69  Pulse: 83  Resp: 17  Temp: 98.8 F (37.1 C)  SpO2: 99%    Home Living Family/patient expects to be discharged to:: Private residence Living Arrangements: Children                  Prior Function            PT Goals (  current goals can now be found in the care plan section) Acute Rehab PT Goals Patient Stated Goal: go home today PT Goal Formulation: With patient Time For Goal Achievement: 12/31/19 Potential to Achieve Goals: Good Progress towards PT goals: Progressing toward goals    Frequency    Min 3X/week      PT Plan Current plan remains appropriate    Co-evaluation              AM-PAC PT "6 Clicks" Mobility   Outcome Measure  Help needed turning from your back to your side while in a flat bed without using bedrails?: None Help needed moving from lying on your  back to sitting on the side of a flat bed without using bedrails?: None Help needed moving to and from a bed to a chair (including a wheelchair)?: None Help needed standing up from a chair using your arms (e.g., wheelchair or bedside chair)?: None Help needed to walk in hospital room?: A Little Help needed climbing 3-5 steps with a railing? : A Little 6 Click Score: 22    End of Session Equipment Utilized During Treatment: Gait belt Activity Tolerance: Patient tolerated treatment well Patient left: in chair;with call bell/phone within reach Nurse Communication: Mobility status PT Visit Diagnosis: Muscle weakness (generalized) (M62.81)     Time: 0630-1601 PT Time Calculation (min) (ACUTE ONLY): 12 min  Charges:  $Gait Training: 8-22 mins                     Sharad Vaneaton P., PTA Acute Rehabilitation Services Pager: [phone] Office: [place] 12/18/2019, 12:26 PM

## 2019-12-18 NOTE — Progress Notes (Addendum)
Gabriella Butler called and no answer, left a message to have her call back, did state that mom has discharge orders to go home today.    -1037: Calling granddaughter, no answer. Message left.   -1048: Patient's daughter called back and stated that she will be able to pick up Mom after 1300 today.

## 2019-12-18 NOTE — Progress Notes (Signed)
5 Days Post-Op    CC: Abdominal pain  Subjective: She seems much better this a.m. sitting up eating a soft breakfast including eggs and pancakes.  She does not complain of any abdominal discomfort, or nausea.  Port sites all look fine.  Objective: Vital signs in last 24 hours: Temp:  [98.4 F (36.9 C)-98.8 F (37.1 C)] 98.8 F (37.1 C) (11/17 0446) Pulse Rate:  [75-83] 83 (11/17 0446) Resp:  [16-17] 17 (11/17 0446) BP: (139-158)/(69-76) 139/69 (11/17 0446) SpO2:  [99 %] 99 % (11/17 0446) Last BM Date:  (pta) 120 p.o. recorded No IV fluid recorded 1450 urine No BM recorded Afebrile vital signs are stable Potassium 3.1 yesterday WBC 7.4 Intake/Output from previous day: 11/16 0701 - 11/17 0700 In: 120 [P.O.:120] Out: 1450 [Urine:1450] Intake/Output this shift: No intake/output data recorded.  General appearance: alert, cooperative and no distress Resp: clear to auscultation bilaterally GI: Soft, minimal postop tenderness.  Tolerating soft diet, port sites all look fine.  Lab Results:  Recent Labs    12/17/19 0836  WBC 7.4  HGB 10.4*  HCT 32.6*  PLT 256    BMET Recent Labs    12/17/19 0836  NA 140  K 3.1*  CL 104  CO2 27  GLUCOSE 80  BUN <5*  CREATININE 0.93  CALCIUM 8.5*   PT/INR No results for input(s): LABPROT, INR in the last 72 hours.  Recent Labs  Lab 12/14/19 0144 12/17/19 0836  AST 46* 30  ALT 18 18  ALKPHOS 54 98  BILITOT 0.6 0.7  PROT 7.0 6.2*  ALBUMIN 2.8* 2.1*     Lipase     Component Value Date/Time   LIPASE 23 12/07/2017 0524     Medications: . amoxicillin-clavulanate  1 tablet Oral Q12H  . bisacodyl  10 mg Rectal Once  . enoxaparin (LOVENOX) injection  40 mg Subcutaneous Q24H  . feeding supplement  237 mL Oral TID BM  . multivitamin with minerals  1 tablet Oral Daily    Assessment/Plan Hx right lower lobe pneumonia AKI Hx asthma Hypertension  Acute cholecystitis-cholelithiasis/sepsis with IR PERC drain  placement 05/23/2019 Laparoscopic cholecystectomy 12/13/2019, DR. Coralie Keens POD #5  FEN: Soft diet ID: Ancef x1 - 11/12; cefotetan 11/12 -11/16: Augmentin 11/16>>  will need a total of 10 days antibiotics. DVT: Lovenox Pain: Patient is taking nothing for pain Follow-up: Dr. Ninfa Linden 01/06/2020   Plan: Discharge home today.  LOS: 4 days    Selisa Tensley 12/18/2019 Please see Amion

## 2019-12-30 DIAGNOSIS — K811 Chronic cholecystitis: Secondary | ICD-10-CM | POA: Diagnosis not present

## 2019-12-30 DIAGNOSIS — I10 Essential (primary) hypertension: Secondary | ICD-10-CM | POA: Diagnosis not present

## 2019-12-30 DIAGNOSIS — M1990 Unspecified osteoarthritis, unspecified site: Secondary | ICD-10-CM | POA: Diagnosis not present

## 2020-01-07 DIAGNOSIS — I10 Essential (primary) hypertension: Secondary | ICD-10-CM | POA: Diagnosis not present

## 2020-01-07 DIAGNOSIS — M1990 Unspecified osteoarthritis, unspecified site: Secondary | ICD-10-CM | POA: Diagnosis not present

## 2020-01-07 DIAGNOSIS — Z9889 Other specified postprocedural states: Secondary | ICD-10-CM | POA: Diagnosis not present

## 2020-02-20 DIAGNOSIS — Z20822 Contact with and (suspected) exposure to covid-19: Secondary | ICD-10-CM | POA: Diagnosis not present

## 2020-04-17 DIAGNOSIS — I10 Essential (primary) hypertension: Secondary | ICD-10-CM | POA: Diagnosis not present

## 2020-04-17 DIAGNOSIS — E669 Obesity, unspecified: Secondary | ICD-10-CM | POA: Diagnosis not present

## 2020-07-07 DIAGNOSIS — M1990 Unspecified osteoarthritis, unspecified site: Secondary | ICD-10-CM | POA: Diagnosis not present

## 2020-07-07 DIAGNOSIS — I10 Essential (primary) hypertension: Secondary | ICD-10-CM | POA: Diagnosis not present

## 2020-07-07 DIAGNOSIS — K5900 Constipation, unspecified: Secondary | ICD-10-CM | POA: Diagnosis not present

## 2020-10-09 DIAGNOSIS — I10 Essential (primary) hypertension: Secondary | ICD-10-CM | POA: Diagnosis not present

## 2020-10-09 DIAGNOSIS — M1990 Unspecified osteoarthritis, unspecified site: Secondary | ICD-10-CM | POA: Diagnosis not present

## 2020-10-09 DIAGNOSIS — K5900 Constipation, unspecified: Secondary | ICD-10-CM | POA: Diagnosis not present

## 2020-10-27 DIAGNOSIS — E669 Obesity, unspecified: Secondary | ICD-10-CM | POA: Diagnosis not present

## 2020-10-27 DIAGNOSIS — M19049 Primary osteoarthritis, unspecified hand: Secondary | ICD-10-CM | POA: Diagnosis not present

## 2020-10-27 DIAGNOSIS — J45909 Unspecified asthma, uncomplicated: Secondary | ICD-10-CM | POA: Diagnosis not present

## 2020-10-27 DIAGNOSIS — I10 Essential (primary) hypertension: Secondary | ICD-10-CM | POA: Diagnosis not present

## 2020-11-24 DIAGNOSIS — I10 Essential (primary) hypertension: Secondary | ICD-10-CM | POA: Diagnosis not present

## 2020-11-24 DIAGNOSIS — Z6835 Body mass index (BMI) 35.0-35.9, adult: Secondary | ICD-10-CM | POA: Diagnosis not present

## 2020-11-24 DIAGNOSIS — R262 Difficulty in walking, not elsewhere classified: Secondary | ICD-10-CM | POA: Diagnosis not present

## 2020-11-24 DIAGNOSIS — E669 Obesity, unspecified: Secondary | ICD-10-CM | POA: Diagnosis not present

## 2020-12-23 DIAGNOSIS — I10 Essential (primary) hypertension: Secondary | ICD-10-CM | POA: Diagnosis not present

## 2020-12-23 DIAGNOSIS — M109 Gout, unspecified: Secondary | ICD-10-CM | POA: Diagnosis not present

## 2020-12-23 DIAGNOSIS — M19049 Primary osteoarthritis, unspecified hand: Secondary | ICD-10-CM | POA: Diagnosis not present

## 2020-12-23 DIAGNOSIS — E669 Obesity, unspecified: Secondary | ICD-10-CM | POA: Diagnosis not present

## 2020-12-23 DIAGNOSIS — E785 Hyperlipidemia, unspecified: Secondary | ICD-10-CM | POA: Diagnosis not present

## 2020-12-23 DIAGNOSIS — Z6835 Body mass index (BMI) 35.0-35.9, adult: Secondary | ICD-10-CM | POA: Diagnosis not present

## 2020-12-23 DIAGNOSIS — J45909 Unspecified asthma, uncomplicated: Secondary | ICD-10-CM | POA: Diagnosis not present

## 2021-02-19 DIAGNOSIS — M19049 Primary osteoarthritis, unspecified hand: Secondary | ICD-10-CM | POA: Diagnosis not present

## 2021-02-19 DIAGNOSIS — E785 Hyperlipidemia, unspecified: Secondary | ICD-10-CM | POA: Diagnosis not present

## 2021-02-19 DIAGNOSIS — E669 Obesity, unspecified: Secondary | ICD-10-CM | POA: Diagnosis not present

## 2021-02-19 DIAGNOSIS — I10 Essential (primary) hypertension: Secondary | ICD-10-CM | POA: Diagnosis not present

## 2021-02-19 DIAGNOSIS — Z6837 Body mass index (BMI) 37.0-37.9, adult: Secondary | ICD-10-CM | POA: Diagnosis not present

## 2021-02-19 DIAGNOSIS — J45909 Unspecified asthma, uncomplicated: Secondary | ICD-10-CM | POA: Diagnosis not present

## 2021-11-15 IMAGING — DX DG CHEST 1V PORT
1 series · 1 of 1 positions shown · non-contrast
Comparison: Chest radiographs 09/14/2016 and earlier.

CLINICAL DATA: 73-year-old female with acute shortness of breath at
1644 hours.

EXAM:
PORTABLE CHEST 1 VIEW

[chest ap]
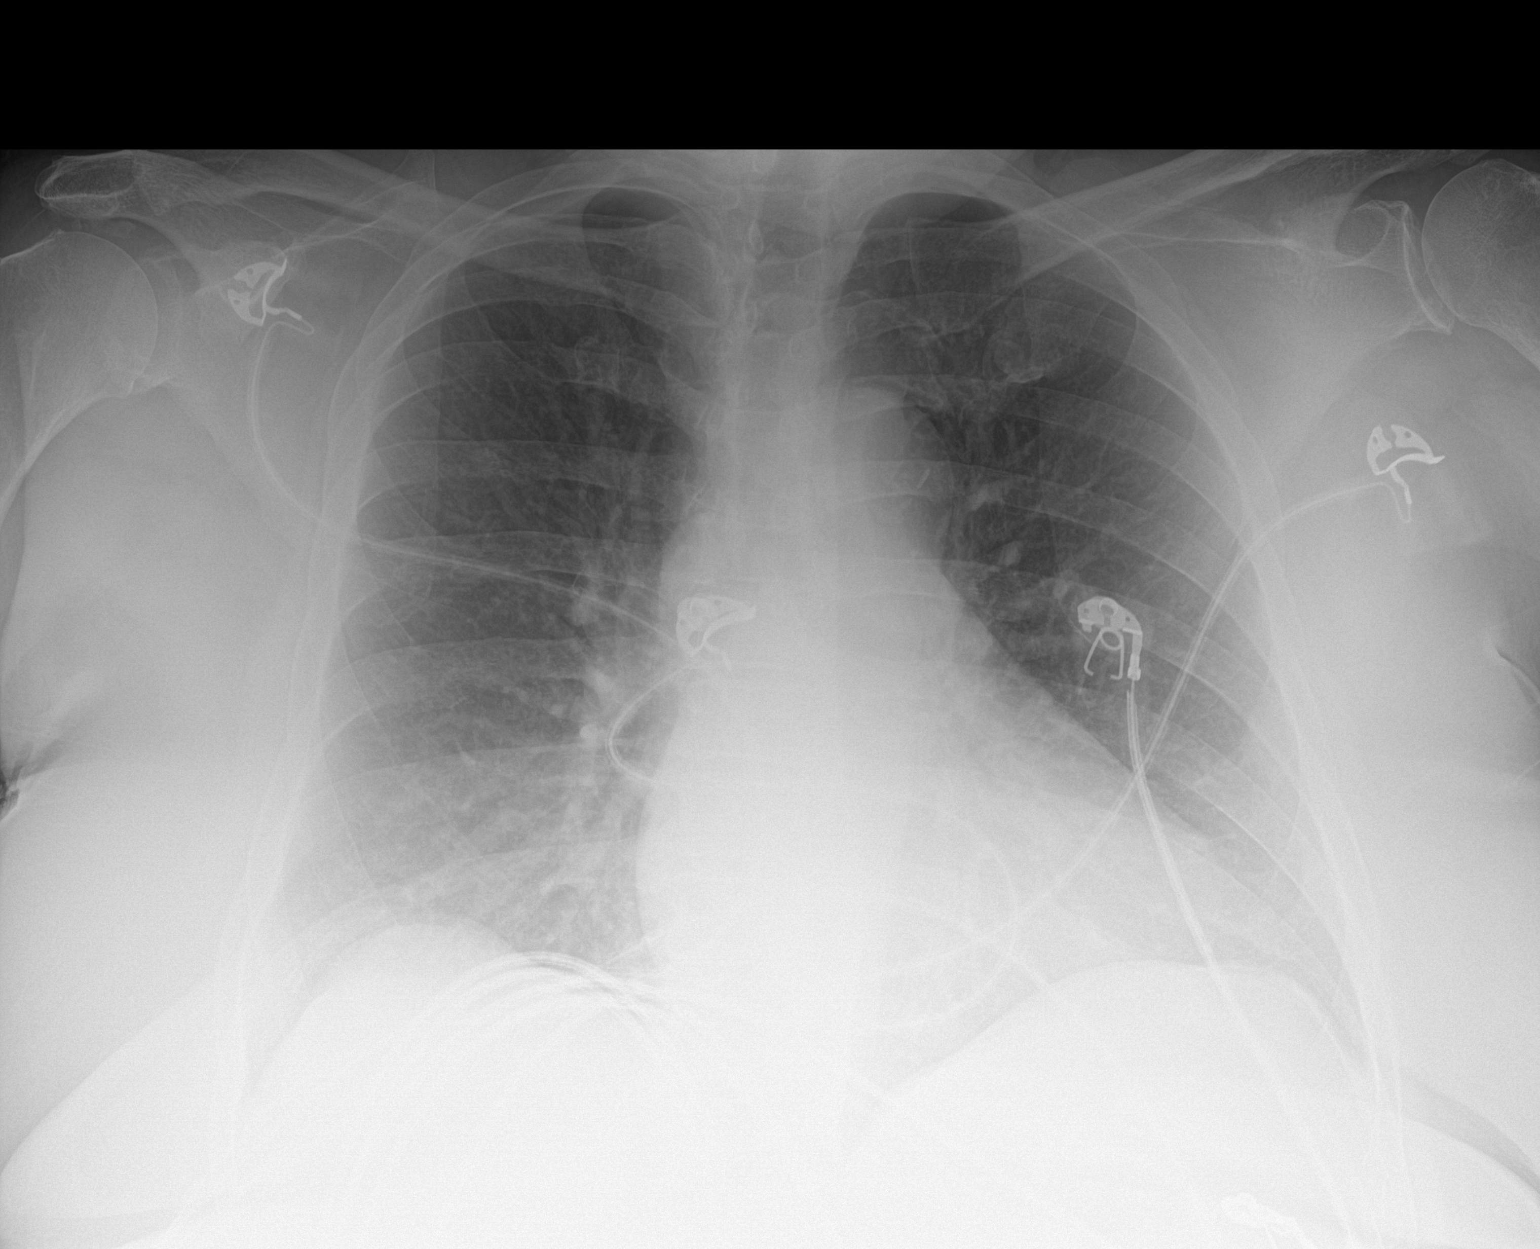

[1 of 1 positions shown; findings below may reference images not displayed]

FINDINGS: Portable AP upright view at 9974 hours. Lung volumes and mediastinal
contours remain within normal limits. Visualized tracheal air column
is within normal limits. Allowing for portable technique the lungs
are clear. No pneumothorax. Stable mild eventration of the diaphragm
(normal variant). No acute osseous abnormality identified.
IMPRESSION: Negative portable chest.

## 2021-12-16 IMAGING — DX DG CHEST 1V PORT
1 series · 1 of 1 positions shown · non-contrast
Comparison: 04/22/2019

CLINICAL DATA: Shortness of breath, sepsis

EXAM:
PORTABLE CHEST 1 VIEW

[chest]
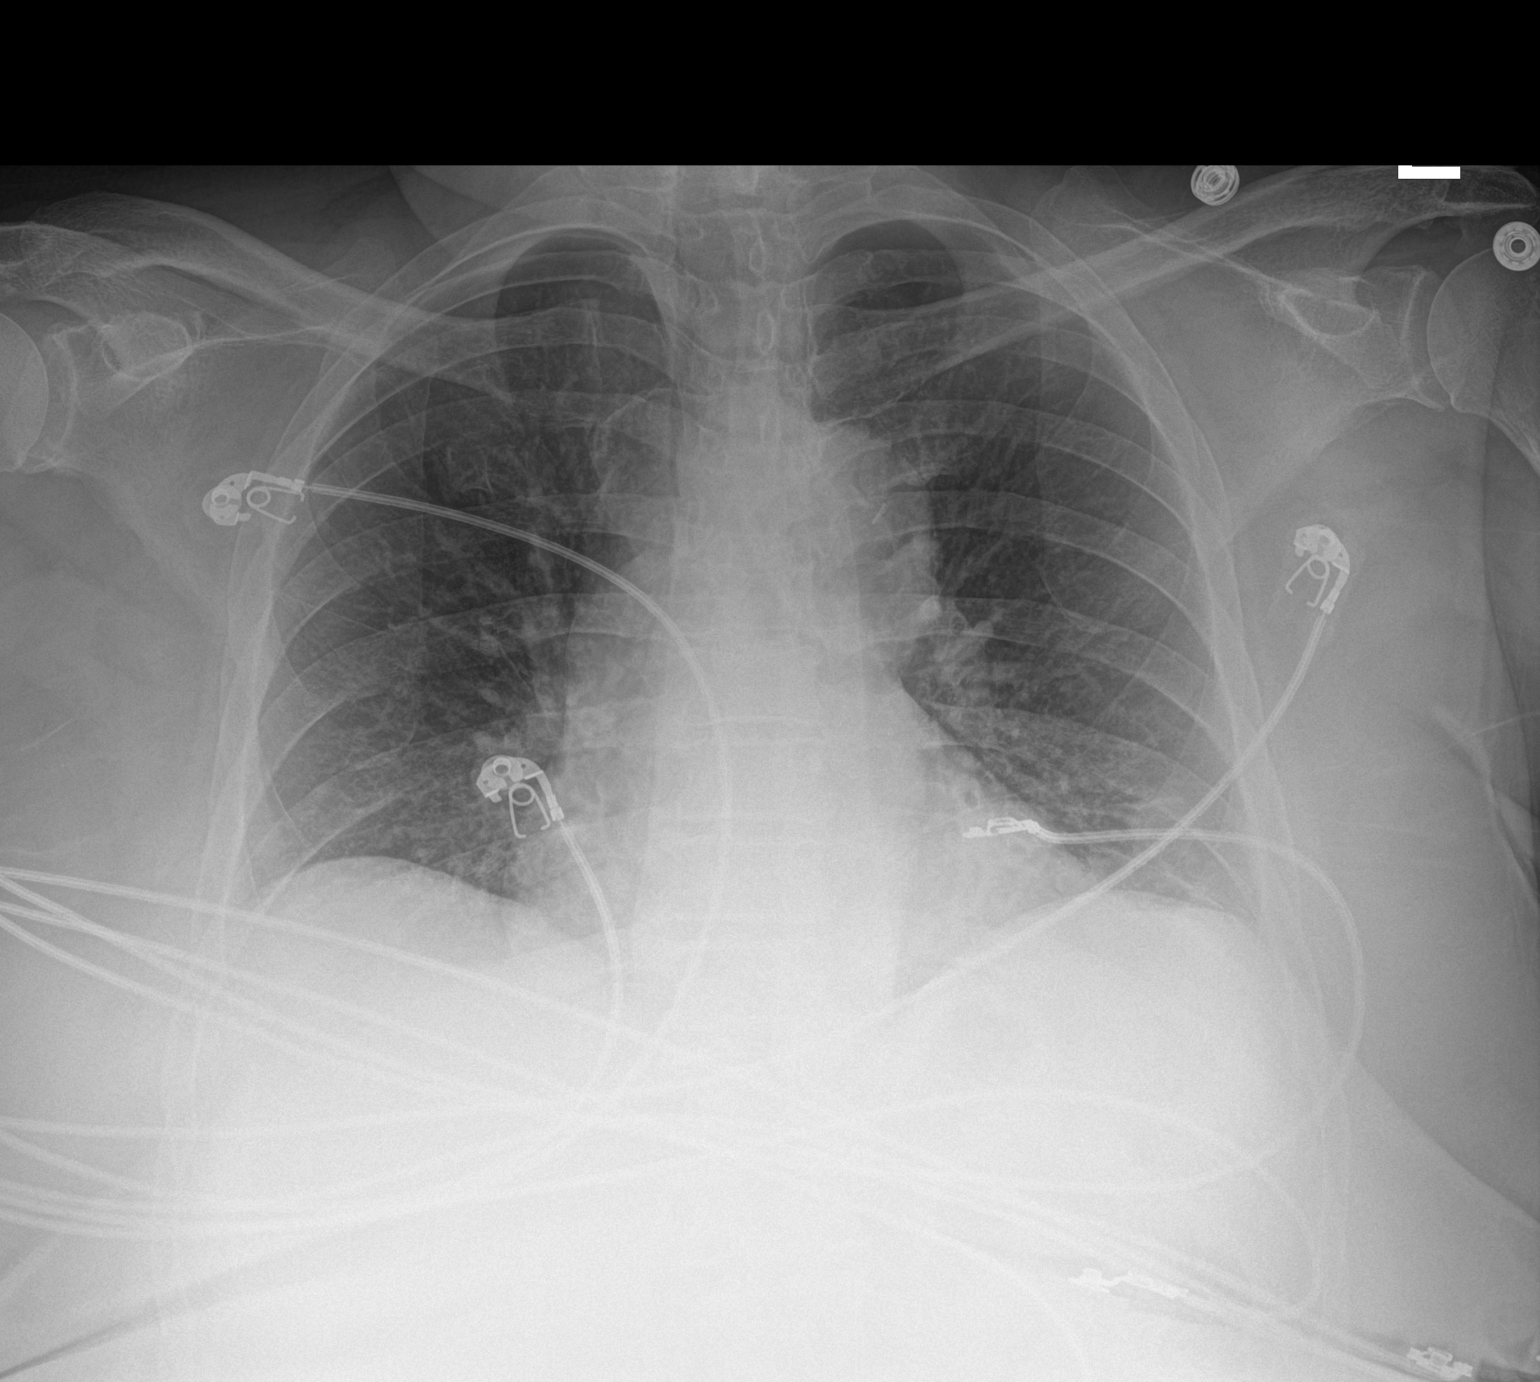

[1 of 1 positions shown; findings below may reference images not displayed]

FINDINGS: Lingular atelectasis. Right lung clear. Heart is normal size. No
effusions or acute bony abnormality.
IMPRESSION: Lingular atelectasis.

## 2023-03-03 ENCOUNTER — Encounter (HOSPITAL_COMMUNITY): Payer: Self-pay

## 2023-03-03 ENCOUNTER — Emergency Department (HOSPITAL_COMMUNITY)
Admission: EM | Admit: 2023-03-03 | Discharge: 2023-03-03 | Disposition: A | Discharge status: Home / Self Care | Payer: 59 | Attending: Emergency Medicine | Admitting: Emergency Medicine

## 2023-03-03 ENCOUNTER — Emergency Department (HOSPITAL_COMMUNITY): Payer: 59

## 2023-03-03 ENCOUNTER — Other Ambulatory Visit: Payer: Self-pay

## 2023-03-03 DIAGNOSIS — S50819A Abrasion of unspecified forearm, initial encounter: Secondary | ICD-10-CM | POA: Insufficient documentation

## 2023-03-03 DIAGNOSIS — Y92410 Unspecified street and highway as the place of occurrence of the external cause: Secondary | ICD-10-CM | POA: Diagnosis not present

## 2023-03-03 DIAGNOSIS — S161XXA Strain of muscle, fascia and tendon at neck level, initial encounter: Secondary | ICD-10-CM | POA: Insufficient documentation

## 2023-03-03 DIAGNOSIS — R519 Headache, unspecified: Secondary | ICD-10-CM | POA: Insufficient documentation

## 2023-03-03 DIAGNOSIS — M549 Dorsalgia, unspecified: Secondary | ICD-10-CM | POA: Diagnosis not present

## 2023-03-03 DIAGNOSIS — R030 Elevated blood-pressure reading, without diagnosis of hypertension: Secondary | ICD-10-CM

## 2023-03-03 DIAGNOSIS — T07XXXA Unspecified multiple injuries, initial encounter: Secondary | ICD-10-CM

## 2023-03-03 DIAGNOSIS — S199XXA Unspecified injury of neck, initial encounter: Secondary | ICD-10-CM | POA: Diagnosis present

## 2023-03-03 MED ORDER — ACETAMINOPHEN ER 650 MG PO TBCR: 650.0000 mg | EXTENDED_RELEASE_TABLET | Freq: Three times a day (TID) | ORAL | 0 refills | Status: AC | PRN

## 2023-03-03 MED ORDER — ACETAMINOPHEN 325 MG PO TABS
650.0000 mg | ORAL_TABLET | Freq: Once | ORAL | Status: AC
  Administered 2023-03-03: 650 mg via ORAL
  Filled 2023-03-03: qty 2
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
